# Patient Record
Sex: Female | Born: 1960 | Race: White | Hispanic: No | Smoking: Former smoker
Health system: Southern US, Community
[De-identification: ages and names within clinical notes are randomized; demographics above are authoritative.]

## PROBLEM LIST (undated history)

## (undated) DIAGNOSIS — R1310 Dysphagia, unspecified: Secondary | ICD-10-CM

## (undated) DIAGNOSIS — Q2116 Sinus venosus atrial septal defect, unspecified: Secondary | ICD-10-CM

## (undated) DIAGNOSIS — R32 Unspecified urinary incontinence: Secondary | ICD-10-CM

## (undated) DIAGNOSIS — G5600 Carpal tunnel syndrome, unspecified upper limb: Secondary | ICD-10-CM

## (undated) DIAGNOSIS — F32A Depression, unspecified: Secondary | ICD-10-CM

## (undated) DIAGNOSIS — G43909 Migraine, unspecified, not intractable, without status migrainosus: Secondary | ICD-10-CM

## (undated) DIAGNOSIS — F319 Bipolar disorder, unspecified: Secondary | ICD-10-CM

## (undated) DIAGNOSIS — R0600 Dyspnea, unspecified: Secondary | ICD-10-CM

## (undated) DIAGNOSIS — K219 Gastro-esophageal reflux disease without esophagitis: Secondary | ICD-10-CM

## (undated) DIAGNOSIS — Q211 Atrial septal defect: Secondary | ICD-10-CM

## (undated) DIAGNOSIS — E7800 Pure hypercholesterolemia, unspecified: Secondary | ICD-10-CM

## (undated) DIAGNOSIS — F329 Major depressive disorder, single episode, unspecified: Secondary | ICD-10-CM

## (undated) DIAGNOSIS — Z8774 Personal history of (corrected) congenital malformations of heart and circulatory system: Secondary | ICD-10-CM

## (undated) DIAGNOSIS — A6000 Herpesviral infection of urogenital system, unspecified: Secondary | ICD-10-CM

## (undated) DIAGNOSIS — R011 Cardiac murmur, unspecified: Secondary | ICD-10-CM

## (undated) DIAGNOSIS — I209 Angina pectoris, unspecified: Secondary | ICD-10-CM

## (undated) HISTORY — DX: Gastro-esophageal reflux disease without esophagitis: K21.9

## (undated) HISTORY — DX: Herpesviral infection of urogenital system, unspecified: A60.00

## (undated) HISTORY — PX: OTHER SURGICAL HISTORY: SHX169

## (undated) HISTORY — PX: ABDOMINAL HYSTERECTOMY: SHX81

## (undated) HISTORY — DX: Depression, unspecified: F32.A

## (undated) HISTORY — DX: Pure hypercholesterolemia, unspecified: E78.00

## (undated) HISTORY — DX: Atrial septal defect: Q21.1

## (undated) HISTORY — DX: Unspecified urinary incontinence: R32

## (undated) HISTORY — DX: Cardiac murmur, unspecified: R01.1

## (undated) HISTORY — DX: Migraine, unspecified, not intractable, without status migrainosus: G43.909

## (undated) HISTORY — DX: Bipolar disorder, unspecified: F31.9

## (undated) HISTORY — PX: HEMORRHOID SURGERY: SHX153

## (undated) HISTORY — DX: Major depressive disorder, single episode, unspecified: F32.9

## (undated) HISTORY — DX: Sinus venosus atrial septal defect, unspecified: Q21.16

## (undated) HISTORY — DX: Dysphagia, unspecified: R13.10

## (undated) HISTORY — DX: Carpal tunnel syndrome, unspecified upper limb: G56.00

## (undated) HISTORY — PX: TONSILLECTOMY: SUR1361

---

## 2005-02-19 ENCOUNTER — Ambulatory Visit: Payer: Self-pay | Admitting: Psychiatry

## 2005-03-05 ENCOUNTER — Ambulatory Visit: Payer: Self-pay | Admitting: Psychiatry

## 2011-06-05 ENCOUNTER — Telehealth (INDEPENDENT_AMBULATORY_CARE_PROVIDER_SITE_OTHER): Payer: Self-pay | Admitting: *Deleted

## 2011-06-05 NOTE — Telephone Encounter (Signed)
Mrs. Thad Ranger called about her daughter , Kristy Lewis. She says that Kristy Lewis is having problems , some of them are Hemorrhoids which were surgically removed , H/O fissures. Strong family history (maternal) of Colon Cancer.  Kristy Lewis is a patient of Dr.Shaw, I told the mother that we prefer the PCP to make a referral to [place]. I explained that Dr.Rehman may want to see Kristy Lewis in the office prior to arranging Colonoscopy. They just want Dr.Rehman to check her.  We can call the patient's daughter on her cell at [phone]

## 2011-06-09 NOTE — Telephone Encounter (Signed)
Will need to request by primary care physician for patient to be seen

## 2011-06-10 ENCOUNTER — Telehealth (INDEPENDENT_AMBULATORY_CARE_PROVIDER_SITE_OTHER): Payer: Self-pay | Admitting: *Deleted

## 2011-06-10 NOTE — Telephone Encounter (Signed)
Kristy Lewis we need records from PCP or Physician per Dr.Rehman then he will be happy to see her

## 2011-06-10 NOTE — Telephone Encounter (Signed)
Kristy Lewis is going to call daughter to arrange

## 2011-06-10 NOTE — Telephone Encounter (Signed)
Open in error

## 2011-06-11 NOTE — Telephone Encounter (Signed)
Called Kristy Lewis and advised her Dr. Karilyn Cota will see her but will need a referral send with office notes first.

## 2011-06-24 ENCOUNTER — Encounter (INDEPENDENT_AMBULATORY_CARE_PROVIDER_SITE_OTHER): Payer: Self-pay | Admitting: *Deleted

## 2011-08-14 ENCOUNTER — Encounter (INDEPENDENT_AMBULATORY_CARE_PROVIDER_SITE_OTHER): Payer: Self-pay | Admitting: *Deleted

## 2011-09-15 ENCOUNTER — Ambulatory Visit (INDEPENDENT_AMBULATORY_CARE_PROVIDER_SITE_OTHER): Payer: Self-pay | Admitting: Internal Medicine

## 2011-09-15 ENCOUNTER — Telehealth (INDEPENDENT_AMBULATORY_CARE_PROVIDER_SITE_OTHER): Payer: Self-pay | Admitting: *Deleted

## 2011-09-15 ENCOUNTER — Other Ambulatory Visit (INDEPENDENT_AMBULATORY_CARE_PROVIDER_SITE_OTHER): Payer: Self-pay | Admitting: *Deleted

## 2011-09-15 ENCOUNTER — Encounter (INDEPENDENT_AMBULATORY_CARE_PROVIDER_SITE_OTHER): Payer: Self-pay | Admitting: Internal Medicine

## 2011-09-15 VITALS — BP 100/62 | HR 56 | Temp 98.4°F | Ht 66.0 in | Wt 173.8 lb

## 2011-09-15 DIAGNOSIS — K625 Hemorrhage of anus and rectum: Secondary | ICD-10-CM | POA: Insufficient documentation

## 2011-09-15 DIAGNOSIS — Z1211 Encounter for screening for malignant neoplasm of colon: Secondary | ICD-10-CM

## 2011-09-15 MED ORDER — PEG-KCL-NACL-NASULF-NA ASC-C 100 G PO SOLR: 1.0000 | Freq: Once | ORAL | Status: DC

## 2011-09-15 NOTE — Progress Notes (Signed)
Subjective:     Patient ID: Kristy Lewis, female   DOB: 03-Mar-1960, 51 y.o.   MRN: [id number]  HPIAnnette is a 51 yr old female referred to our office for rectal bleeding, screening colonoscopy.  She tells me she sees blood with her stools about 3-4 times a week.  Bright red in color. No melena. Appetite is good. No weight loss. Acid reflux controlled with Dexilant. No abdominal pain.  She tells me when she has a BM she has to put pressure near her rectum to have a BM since the birth of her last child.   She has never undergone a screening colonoscopy. Hemorrhoidectomy in the 1990s by Dr. Cleotis Nipper for constipation.    Review of Systems Current Outpatient Prescriptions  Medication Sig Dispense Refill  . dexlansoprazole (DEXILANT) 60 MG capsule Take 60 mg by mouth daily.      . diphenhydramine-acetaminophen (TYLENOL PM) 25-500 MG TABS Take 1 tablet by mouth at bedtime as needed.      Marland Kitchen estradiol (ESTRACE) 0.5 MG tablet Take 0.5 mg by mouth daily.      Marland Kitchen FLUoxetine (PROZAC) 40 MG capsule Take 40 mg by mouth daily.      Marland Kitchen gabapentin (NEURONTIN) 100 MG capsule Take 100 mg by mouth 2 (two) times daily.      Marland Kitchen topiramate (TOPAMAX) 50 MG tablet Take 50 mg by mouth 2 (two) times daily.       Past Medical History  Diagnosis Date  . Migraines    Past Surgical History  Procedure Date  . Abdominal hysterectomy     with bladder surgery. Ovaries remained  . Hemorrhoid surgery   . Tonsillectomy    Family Status  Relation Status Death Age  . Mother Alive     anemic  . Father Deceased     suicide  . Brother Alive     good health   History   Social History  . Marital Status: Single    Spouse Name: N/A    Number of Children: N/A  . Years of Education: N/A   Occupational History  . Not on file.   Social History Main Topics  . Smoking status: Former Games developer  . Smokeless tobacco: Not on file   Comment: quit 2 yrs ago after smoking for over 20 yrs.  . Alcohol Use: Yes     socially   . Drug Use: Not on file  . Sexually Active: Not on file   Other Topics Concern  . Not on file   Social History Narrative  . No narrative on file   Allergies  Allergen Reactions  . Iodine   . Morphine And Related   . Shellfish Allergy        Objective:   Physical Exam Filed Vitals:   09/15/11 1019  Height: 5\' 6"  (1.676 m)  Weight: 173 lb 12.8 oz (78.835 kg)   Alert and oriented. Skin warm and dry. Oral mucosa is moist.   . Sclera anicteric, conjunctivae is pink. Thyroid not enlarged. No cervical lymphadenopathy. Lungs clear. Heart regular rate and rhythm.  Abdomen is soft. Bowel sounds are positive. No hepatomegaly. No abdominal masses felt. No tenderness.  No edema to lower extremities. Stool none and guaiac negative.      Assessment:    Rectal bleeding, probably hemorrhoidal. Screening colonoscopy    Plan:    Colonoscopy.   The risks and benefits such as perforation, bleeding, and infection were reviewed with the patient and is agreeable.

## 2011-09-15 NOTE — Telephone Encounter (Signed)
Patient needs movi prep 

## 2011-09-15 NOTE — Patient Instructions (Addendum)
Colonoscopy with Dr. Rehman. The risks and benefits such as perforation, bleeding, and infection were reviewed with the patient and is agreeable. 

## 2011-10-10 ENCOUNTER — Encounter (HOSPITAL_COMMUNITY): Payer: Self-pay | Admitting: Pharmacy Technician

## 2011-10-14 MED ORDER — SODIUM CHLORIDE 0.45 % IV SOLN
INTRAVENOUS | Status: DC
  Administered 2011-10-15: 1000 mL via INTRAVENOUS

## 2011-10-15 ENCOUNTER — Encounter (HOSPITAL_COMMUNITY): Payer: Self-pay | Admitting: *Deleted

## 2011-10-15 ENCOUNTER — Ambulatory Visit (HOSPITAL_COMMUNITY)
Admission: RE | Admit: 2011-10-15 | Discharge: 2011-10-15 | Disposition: A | Discharge status: Home / Self Care | Payer: PRIVATE HEALTH INSURANCE | Source: Ambulatory Visit | Attending: Internal Medicine | Admitting: Internal Medicine

## 2011-10-15 ENCOUNTER — Encounter (HOSPITAL_COMMUNITY)
Admission: RE | Disposition: A | Discharge status: Home / Self Care | Payer: Self-pay | Source: Ambulatory Visit | Attending: Internal Medicine

## 2011-10-15 DIAGNOSIS — K644 Residual hemorrhoidal skin tags: Secondary | ICD-10-CM

## 2011-10-15 DIAGNOSIS — D126 Benign neoplasm of colon, unspecified: Secondary | ICD-10-CM

## 2011-10-15 DIAGNOSIS — K625 Hemorrhage of anus and rectum: Secondary | ICD-10-CM

## 2011-10-15 DIAGNOSIS — K5730 Diverticulosis of large intestine without perforation or abscess without bleeding: Secondary | ICD-10-CM

## 2011-10-15 DIAGNOSIS — K921 Melena: Secondary | ICD-10-CM | POA: Insufficient documentation

## 2011-10-15 HISTORY — PX: COLONOSCOPY: SHX5424

## 2011-10-15 SURGERY — COLONOSCOPY
Anesthesia: Moderate Sedation

## 2011-10-15 MED ORDER — MEPERIDINE HCL 50 MG/ML IJ SOLN
INTRAMUSCULAR | Status: AC
  Filled 2011-10-15: qty 1

## 2011-10-15 MED ORDER — STERILE WATER FOR IRRIGATION IR SOLN
Status: DC | PRN
  Administered 2011-10-15: 13:00:00

## 2011-10-15 MED ORDER — MEPERIDINE HCL 50 MG/ML IJ SOLN
INTRAMUSCULAR | Status: DC | PRN
  Administered 2011-10-15 (×2): 25 mg via INTRAVENOUS

## 2011-10-15 MED ORDER — MIDAZOLAM HCL 5 MG/5ML IJ SOLN
INTRAMUSCULAR | Status: DC | PRN
  Administered 2011-10-15: 1 mg via INTRAVENOUS
  Administered 2011-10-15 (×3): 2 mg via INTRAVENOUS

## 2011-10-15 MED ORDER — MIDAZOLAM HCL 5 MG/5ML IJ SOLN
INTRAMUSCULAR | Status: AC
  Filled 2011-10-15: qty 10

## 2011-10-15 NOTE — H&P (Signed)
Kristy Lewis is an 51 y.o. female.   Chief Complaint: Patient is here for colonoscopy. HPI: Patient is 51 year old Caucasian female who is here for screening colonoscopy. She is intermittent hematochezia felt to be secondary to hemorrhoids. She also has intermittent constipation. She has good appetite denies weight loss. Him history is significant for colon carcinoma in her grandmother. Family is negative for CRC in a first-degree relative.  Past Medical History  Diagnosis Date  . Migraines   . Genital herpes     at age 48  . Cancer     Past Surgical History  Procedure Date  . Abdominal hysterectomy     with bladder surgery. Ovaries remained  . Hemorrhoid surgery   . Tonsillectomy   . Left shoulder manipulation     History reviewed. No pertinent family history. Social History:  reports that she has quit smoking. She does not have any smokeless tobacco history on file. She reports that she drinks alcohol. She reports that she does not use illicit drugs.  Allergies:  Allergies  Allergen Reactions  . Iodine   . Morphine And Related   . Shellfish Allergy     Medications Prior to Admission  Medication Sig Dispense Refill  . dexlansoprazole (DEXILANT) 60 MG capsule Take 60 mg by mouth daily as needed.       . diphenhydramine-acetaminophen (TYLENOL PM) 25-500 MG TABS Take 1 tablet by mouth at bedtime as needed.      Marland Kitchen estradiol (ESTRACE) 0.5 MG tablet Take 0.5 mg by mouth daily.      Marland Kitchen FLUoxetine (PROZAC) 40 MG capsule Take 40 mg by mouth daily.      Marland Kitchen gabapentin (NEURONTIN) 100 MG capsule Take 100 mg by mouth 2 (two) times daily.      . peg 3350 powder (MOVIPREP) 100 G SOLR Take 1 kit (100 g total) by mouth once.  1 kit  0  . topiramate (TOPAMAX) 50 MG tablet Take 50 mg by mouth 2 (two) times daily.        No results found for this or any previous visit (from the past 48 hour(s)). No results found.  ROS  Blood pressure 104/68, pulse 63, temperature 98 F (36.7 C),  temperature source Oral, resp. rate 19, height 5\' 6"  (1.676 m), weight 165 lb (74.844 kg), SpO2 95.00%. Physical Exam  Constitutional: She appears well-developed and well-nourished.  HENT:  Mouth/Throat: Oropharynx is clear and moist.  Eyes: Conjunctivae normal are normal. No scleral icterus.  Neck: No thyromegaly present.  Cardiovascular: Normal rate, regular rhythm and normal heart sounds.   No murmur heard. Respiratory: Effort normal and breath sounds normal.  GI: Soft. She exhibits no distension and no mass. There is no tenderness.  Musculoskeletal: She exhibits no edema.  Lymphadenopathy:    She has no cervical adenopathy.  Neurological: She is alert.  Skin: Skin is warm and dry.     Assessment/Plan Average risk screening colonoscopy.  Kristy Lewis U 10/15/2011, 1:21 PM

## 2011-10-15 NOTE — Op Note (Signed)
COLONOSCOPY PROCEDURE REPORT  PATIENT:  Kristy Lewis  MR#:  [id number] Birthdate:  1960-03-20, 51 y.o., female Endoscopist:  Dr. Malissa Hippo, MD Referred By:  Dr. Kirstie Peri, MD Procedure Date: 10/15/2011  Procedure:   Colonoscopy  Indications:  Patient is 51 year old Caucasian female was undergoing average risk screening colonoscopy. She has intermittent hematochezia felt to be secondary to hemorrhoids.  Informed Consent:  The procedure and risks were reviewed with the patient and informed consent was obtained.  Medications:  Demerol 50 mg IV Versed 7 mg IV  Description of procedure:  After a digital rectal exam was performed, that colonoscope was advanced from the anus through the rectum and colon to the area of the cecum, ileocecal valve and appendiceal orifice. The cecum was deeply intubated. These structures were well-seen and photographed for the record. From the level of the cecum and ileocecal valve, the scope was slowly and cautiously withdrawn. The mucosal surfaces were carefully surveyed utilizing scope tip to flexion to facilitate fold flattening as needed. The scope was pulled down into the rectum where a thorough exam including retroflexion was performed.  Findings:   Prep satisfactory. Small polyp ablated via cold biopsy from ileocecal valve. Two small diverticula at sigmoid colon. Normal rectal mucosa. Small hemorrhoids below the dentate line and focal thickening to anoderm.  Therapeutic/Diagnostic Maneuvers Performed:  See above  Complications:  None  Cecal Withdrawal Time:  15 minutes  Impression:  Examination performed to cecum. Small polyp ablated via cold biopsy from ileocecal valve. Two small diverticula at sigmoid colon. Small external hemorrhoids.  Recommendations:  Standard instructions given. I will contact patient with biopsy results and further recommendations.   REHMAN,NAJEEB U  10/15/2011 1:56 PM  CC: Dr. Kirstie Peri, MD & Dr. Bonnetta Barry  ref. provider found

## 2011-10-20 ENCOUNTER — Encounter (HOSPITAL_COMMUNITY): Payer: Self-pay | Admitting: Internal Medicine

## 2011-10-29 ENCOUNTER — Encounter (INDEPENDENT_AMBULATORY_CARE_PROVIDER_SITE_OTHER): Payer: Self-pay | Admitting: *Deleted

## 2013-11-17 ENCOUNTER — Other Ambulatory Visit (HOSPITAL_COMMUNITY): Payer: Self-pay | Admitting: Internal Medicine

## 2013-11-17 DIAGNOSIS — Z139 Encounter for screening, unspecified: Secondary | ICD-10-CM

## 2013-11-21 ENCOUNTER — Ambulatory Visit (HOSPITAL_COMMUNITY)
Admission: RE | Admit: 2013-11-21 | Discharge: 2013-11-21 | Disposition: A | Discharge status: Home / Self Care | Payer: BC Managed Care – PPO | Source: Ambulatory Visit | Attending: Internal Medicine | Admitting: Internal Medicine

## 2013-11-21 DIAGNOSIS — Z1231 Encounter for screening mammogram for malignant neoplasm of breast: Secondary | ICD-10-CM | POA: Insufficient documentation

## 2013-11-21 DIAGNOSIS — Z139 Encounter for screening, unspecified: Secondary | ICD-10-CM

## 2016-08-26 ENCOUNTER — Telehealth: Payer: Self-pay | Admitting: Cardiovascular Disease

## 2016-08-26 ENCOUNTER — Ambulatory Visit (INDEPENDENT_AMBULATORY_CARE_PROVIDER_SITE_OTHER): Payer: BLUE CROSS/BLUE SHIELD | Admitting: Cardiovascular Disease

## 2016-08-26 ENCOUNTER — Encounter: Payer: Self-pay | Admitting: *Deleted

## 2016-08-26 ENCOUNTER — Encounter: Payer: Self-pay | Admitting: Cardiovascular Disease

## 2016-08-26 VITALS — BP 110/70 | HR 58 | Ht 66.0 in | Wt 161.0 lb

## 2016-08-26 DIAGNOSIS — F32A Depression, unspecified: Secondary | ICD-10-CM

## 2016-08-26 DIAGNOSIS — R011 Cardiac murmur, unspecified: Secondary | ICD-10-CM

## 2016-08-26 DIAGNOSIS — F329 Major depressive disorder, single episode, unspecified: Secondary | ICD-10-CM

## 2016-08-26 DIAGNOSIS — R079 Chest pain, unspecified: Secondary | ICD-10-CM | POA: Diagnosis not present

## 2016-08-26 DIAGNOSIS — K219 Gastro-esophageal reflux disease without esophagitis: Secondary | ICD-10-CM | POA: Diagnosis not present

## 2016-08-26 DIAGNOSIS — R1310 Dysphagia, unspecified: Secondary | ICD-10-CM | POA: Diagnosis not present

## 2016-08-26 NOTE — Progress Notes (Signed)
CARDIOLOGY CONSULT NOTE  Patient ID: YILIA SACCA MRN: [id number] DOB/AGE: 03/31/1960 56 y.o.  Admit date: (Not on file) Primary Physician: Monico Blitz, MD Referring Physician: Manuella Ghazi  Reason for Consultation: chest pain  HPI: Kristy Lewis is a 56 y.o. female who is being seen today for the evaluation of chest pain at the request of Monico Blitz, MD.   She has a history of GERD.  Echocardiogram in November 2016 demonstrated normal left ventricular systolic function, EF 25-36%, normal diastolic function, mild right ventricular dilatation, mild right atrial dilatation, and mild tricuspid and pulmonic regurgitation, with a small pericardial effusion.  Recent ECG performed at PCPs office which I personally interpreted demonstrated normal sinus rhythm with no ischemic abnormalities nor arrhythmias noted.  She is tearful when describing her symptoms. She often says "my heart hurts and my heart is broken" and believes her symptoms are due to emotional stress. She has been having retrosternal chest pains occasionally radiating into the back for the past 3 weeks. This has occurred while she has been working. 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 She has managed this business on her own for the past 24 years.  She said both deep breaths help her symptoms as well as diazepam. She has been on Prozac for over 20 years. She said she has a lot of stress as she helps to take care of her daughter and her grandchildren. Her daughter used to be on drugs but has been drug free since May 25. Her daughter currently lives with her.  Social history: She has been single for the past 12 years. 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    Allergies  Allergen Reactions  . Iodine   . Morphine And Related   . Shellfish Allergy     Current Outpatient Prescriptions  Medication Sig Dispense Refill  . azelastine  (ASTELIN) 0.1 % nasal spray Place into both nostrils 2 (two) times daily. Use in each nostril as directed    . dexlansoprazole (DEXILANT) 60 MG capsule Take 60 mg by mouth daily as needed.     . diazepam (VALIUM) 2 MG tablet Take 2 mg by mouth every 6 (six) hours as needed for anxiety.    . diphenhydramine-acetaminophen (TYLENOL PM) 25-500 MG TABS Take 1 tablet by mouth at bedtime as needed.    Marland Kitchen FLUoxetine (PROZAC) 40 MG capsule Take 40 mg by mouth daily.    Marland Kitchen omeprazole (PRILOSEC) 20 MG capsule Take 20 mg by mouth 2 (two) times daily before a meal.      No current facility-administered medications for this visit.     Past Medical History:  Diagnosis Date  . Bipolar disorder ([place])   . Cancer ([place])   . Cardiac murmur   . Carpal tunnel syndrome   . Depressive disorder   . Dysphagia   . Esophageal reflux   . Genital herpes    at age 56  . Hypercholesteremia   . Migraines   . Urinary incontinence     Past Surgical History:  Procedure Laterality Date  . ABDOMINAL HYSTERECTOMY     with bladder surgery. Ovaries remained  . COLONOSCOPY  10/15/2011   Procedure: COLONOSCOPY;  Surgeon: Rogene Houston, MD;  Location: [facility];  Service: Endoscopy;  Laterality: N/A;  200  . HEMORRHOID SURGERY    . left shoulder  manipulation    . TONSILLECTOMY      Social History   Social History  . Marital status: Divorced    Spouse name: N/A  . Number of children: N/A  . Years of education: N/A   Occupational History  . Not on file.   Social History Main Topics  . Smoking status: Former Research scientist (life sciences)  . Smokeless tobacco: Never Used     Comment: quit 2 yrs ago after smoking for over 20 yrs.  . Alcohol use Yes     Comment: socially  . Drug use: No  . Sexual activity: Not on file   Other Topics Concern  . Not on file   Social History Narrative  . No narrative on file     No family history of premature CAD in 1st degree relatives.  Current Meds  Medication Sig  . azelastine  (ASTELIN) 0.1 % nasal spray Place into both nostrils 2 (two) times daily. Use in each nostril as directed  . dexlansoprazole (DEXILANT) 60 MG capsule Take 60 mg by mouth daily as needed.   . diazepam (VALIUM) 2 MG tablet Take 2 mg by mouth every 6 (six) hours as needed for anxiety.  . diphenhydramine-acetaminophen (TYLENOL PM) 25-500 MG TABS Take 1 tablet by mouth at bedtime as needed.  Marland Kitchen FLUoxetine (PROZAC) 40 MG capsule Take 40 mg by mouth daily.  Marland Kitchen omeprazole (PRILOSEC) 20 MG capsule Take 20 mg by mouth 2 (two) times daily before a meal.       Review of systems complete and found to be negative unless listed above in HPI    Physical exam Blood pressure 110/70, pulse (!) 58, height 5\' 6"  (1.676 m), weight 161 lb (73 kg), SpO2 97 %. General: NAD Neck: No JVD, no thyromegaly or thyroid nodule.  Lungs: Clear to auscultation bilaterally with normal respiratory effort. CV: Nondisplaced PMI. Regular rate and rhythm, normal S1/S2, no F2/T2, soft systolic murmur over LUSB.  No peripheral edema.  No carotid bruit.    Abdomen: Soft, nontender, no distention.  Skin: Intact without lesions or rashes.  Neurologic: Alert and oriented x 3.  Psych: Tearful. Extremities: No clubbing or cyanosis.  HEENT: Normal.   ECG: Most recent ECG reviewed.   Labs: No results found for: K, BUN, CREATININE, ALT, TSH, HGB   Lipids: No results found for: LDLCALC, LDLDIRECT, CHOL, TRIG, HDL      ASSESSMENT AND PLAN:  1. Chest pain and murmur: This very well may be due to emotional stress. She also has dysphagia for solids and pills. There may be a component of esophageal stricture or narrowing. She does have a cardiac murmur. I will obtain an echocardiogram and stress echocardiogram for further evaluation.  2. GERD: Given her history of dysphagia, she may need to see a gastroenterologist for EGD.  3. Depression: Prozac may no longer be working. She was tearful throughout my evaluation. I will make a  behavioral therapy referral.  Disposition: Follow up in 1 month  Signed: Kate Sable, M.D., F.A.C.C.  08/26/2016, 2:10 PM

## 2016-08-26 NOTE — Patient Instructions (Signed)
Medication Instructions:  Continue all current medications.  Labwork: none  Testing/Procedures:  Your physician has requested that you have an echocardiogram. Echocardiography is a painless test that uses sound waves to create images of your heart. It provides your doctor with information about the size and shape of your heart and how well your heart's chambers and valves are working. This procedure takes approximately one hour. There are no restrictions for this procedure.  Your physician has requested that you have a stress echocardiogram. For further information please visit [email or web address]. Please follow instruction sheet as given.  Office will contact with results via phone or letter.    Follow-Up: 1 month   Any Other Special Instructions Will Be Listed Below (If Applicable). You have been referred to:  Behavioral Health at [facility]  If you need a refill on your cardiac medications before your next appointment, please call your pharmacy.

## 2016-08-26 NOTE — Telephone Encounter (Signed)
Pre-cert Verification for the following procedure   Echo & Stress Echo scheduled for 08-29-16 at [facility]

## 2016-08-29 ENCOUNTER — Ambulatory Visit (HOSPITAL_BASED_OUTPATIENT_CLINIC_OR_DEPARTMENT_OTHER)
Admission: RE | Admit: 2016-08-29 | Discharge: 2016-08-29 | Disposition: A | Discharge status: Home / Self Care | Payer: BLUE CROSS/BLUE SHIELD | Source: Ambulatory Visit | Attending: Cardiovascular Disease | Admitting: Cardiovascular Disease

## 2016-08-29 ENCOUNTER — Ambulatory Visit (HOSPITAL_COMMUNITY)
Admission: RE | Admit: 2016-08-29 | Discharge: 2016-08-29 | Disposition: A | Discharge status: Home / Self Care | Payer: BLUE CROSS/BLUE SHIELD | Source: Ambulatory Visit | Attending: Cardiovascular Disease | Admitting: Cardiovascular Disease

## 2016-08-29 DIAGNOSIS — R011 Cardiac murmur, unspecified: Secondary | ICD-10-CM | POA: Insufficient documentation

## 2016-08-29 DIAGNOSIS — R079 Chest pain, unspecified: Secondary | ICD-10-CM

## 2016-08-29 DIAGNOSIS — K219 Gastro-esophageal reflux disease without esophagitis: Secondary | ICD-10-CM | POA: Insufficient documentation

## 2016-08-29 DIAGNOSIS — Z87891 Personal history of nicotine dependence: Secondary | ICD-10-CM | POA: Insufficient documentation

## 2016-08-29 DIAGNOSIS — I358 Other nonrheumatic aortic valve disorders: Secondary | ICD-10-CM | POA: Insufficient documentation

## 2016-08-29 DIAGNOSIS — I517 Cardiomegaly: Secondary | ICD-10-CM | POA: Insufficient documentation

## 2016-08-29 DIAGNOSIS — E785 Hyperlipidemia, unspecified: Secondary | ICD-10-CM | POA: Insufficient documentation

## 2016-08-29 LAB — ECHOCARDIOGRAM STRESS TEST
CHL CUP RESTING HR STRESS: 63 {beats}/min
CHL RATE OF PERCEIVED EXERTION: 16
CSEPED: 7 min
CSEPEW: 10.1 METS
CSEPPHR: 123 {beats}/min
Exercise duration (sec): 42 s
MPHR: 165 {beats}/min
Percent HR: 74 %

## 2016-08-29 NOTE — Progress Notes (Signed)
*  PRELIMINARY RESULTS* Echocardiogram Echocardiogram Stress Test has been performed.  Kristy Lewis 08/29/2016, 1:31 PM

## 2016-08-29 NOTE — Progress Notes (Signed)
*  PRELIMINARY RESULTS* Echocardiogram 2D Echocardiogram has been performed.  Leavy Cella 08/29/2016, 11:45 AM

## 2016-09-02 ENCOUNTER — Other Ambulatory Visit (HOSPITAL_COMMUNITY): Payer: BLUE CROSS/BLUE SHIELD

## 2016-09-02 ENCOUNTER — Telehealth: Payer: Self-pay | Admitting: *Deleted

## 2016-09-02 DIAGNOSIS — R079 Chest pain, unspecified: Secondary | ICD-10-CM

## 2016-09-02 NOTE — Telephone Encounter (Signed)
ECHO -  Notes recorded by Herminio Commons, MD on 09/01/2016 at 9:33 AM EDT Right-sided heart chambers were enlarged likely due to tobacco use and consequent lung disease. Main pumping chamber showed normal function.  STRESS ECHO -  Notes recorded by Herminio Commons, MD on 09/01/2016 at 9:32 AM EDT Did not achieve target HR, thus ability to detect blockages is decreased. Please obtain Lexiscan Myoview.

## 2016-09-02 NOTE — Telephone Encounter (Signed)
Notes recorded by Laurine Blazer, LPN on 4/65/0354 at 6:56 PM EDT Patient notified. Copy to pmd. She agrees to do stress test. Will put order in & forward to [facility] for scheduling. Instructions given to have nothing to eat 4-6 hours prior to test & no caffeine x 24 hours. She may take all meds morning of test. ------

## 2016-09-03 ENCOUNTER — Telehealth: Payer: Self-pay | Admitting: Cardiovascular Disease

## 2016-09-03 NOTE — Telephone Encounter (Signed)
Lexiscan scheduled at [facility] Aug 23 arrive at 8:45

## 2016-09-11 ENCOUNTER — Encounter (HOSPITAL_COMMUNITY): Payer: Self-pay

## 2016-09-11 ENCOUNTER — Encounter (HOSPITAL_BASED_OUTPATIENT_CLINIC_OR_DEPARTMENT_OTHER)
Admission: RE | Admit: 2016-09-11 | Discharge: 2016-09-11 | Disposition: A | Discharge status: Home / Self Care | Payer: BLUE CROSS/BLUE SHIELD | Source: Ambulatory Visit | Attending: Cardiovascular Disease | Admitting: Cardiovascular Disease

## 2016-09-11 ENCOUNTER — Encounter (HOSPITAL_COMMUNITY)
Admission: RE | Admit: 2016-09-11 | Discharge: 2016-09-11 | Disposition: A | Discharge status: Home / Self Care | Payer: BLUE CROSS/BLUE SHIELD | Source: Ambulatory Visit | Attending: Cardiovascular Disease | Admitting: Cardiovascular Disease

## 2016-09-11 DIAGNOSIS — R079 Chest pain, unspecified: Secondary | ICD-10-CM | POA: Insufficient documentation

## 2016-09-11 LAB — NM MYOCAR MULTI W/SPECT W/WALL MOTION / EF
CHL CUP NUCLEAR SRS: 3
CHL CUP NUCLEAR SSS: 3
CHL CUP RESTING HR STRESS: 55 {beats}/min
LHR: 0.42
LV dias vol: 60 mL (ref 46–106)
LV sys vol: 19 mL
NUC STRESS TID: 1.27
Peak HR: 83 {beats}/min
SDS: 0

## 2016-09-11 MED ORDER — TECHNETIUM TC 99M TETROFOSMIN IV KIT
30.0000 | PACK | Freq: Once | INTRAVENOUS | Status: AC | PRN
  Administered 2016-09-11: 32.5 via INTRAVENOUS

## 2016-09-11 MED ORDER — TECHNETIUM TC 99M TETROFOSMIN IV KIT
10.0000 | PACK | Freq: Once | INTRAVENOUS | Status: AC | PRN
  Administered 2016-09-11: 11 via INTRAVENOUS

## 2016-09-11 MED ORDER — REGADENOSON 0.4 MG/5ML IV SOLN
INTRAVENOUS | Status: AC
  Administered 2016-09-11: 0.4 mg via INTRAVENOUS
  Filled 2016-09-11: qty 5

## 2016-09-11 MED ORDER — SODIUM CHLORIDE 0.9% FLUSH
INTRAVENOUS | Status: AC
  Administered 2016-09-11: 10 mL via INTRAVENOUS
  Filled 2016-09-11: qty 10

## 2016-09-18 ENCOUNTER — Telehealth: Payer: Self-pay | Admitting: *Deleted

## 2016-09-18 NOTE — Telephone Encounter (Signed)
Notes recorded by Laurine Blazer, LPN on 2/90/9030 at 14:99 AM EDT Patient notified. Copy to pmd. Follow up already scheduled for 09/26/2016. ------  Notes recorded by Herminio Commons, MD on 09/11/2016 at 4:34 PM EDT Normal.

## 2016-09-26 ENCOUNTER — Encounter: Payer: Self-pay | Admitting: Cardiovascular Disease

## 2016-09-26 ENCOUNTER — Ambulatory Visit (INDEPENDENT_AMBULATORY_CARE_PROVIDER_SITE_OTHER): Payer: BLUE CROSS/BLUE SHIELD | Admitting: Cardiovascular Disease

## 2016-09-26 VITALS — BP 100/65 | HR 66 | Ht 66.0 in | Wt 165.2 lb

## 2016-09-26 DIAGNOSIS — Z87891 Personal history of nicotine dependence: Secondary | ICD-10-CM

## 2016-09-26 DIAGNOSIS — I272 Pulmonary hypertension, unspecified: Secondary | ICD-10-CM | POA: Diagnosis not present

## 2016-09-26 DIAGNOSIS — Z832 Family history of diseases of the blood and blood-forming organs and certain disorders involving the immune mechanism: Secondary | ICD-10-CM | POA: Diagnosis not present

## 2016-09-26 DIAGNOSIS — I517 Cardiomegaly: Secondary | ICD-10-CM | POA: Diagnosis not present

## 2016-09-26 DIAGNOSIS — R0602 Shortness of breath: Secondary | ICD-10-CM

## 2016-09-26 DIAGNOSIS — R079 Chest pain, unspecified: Secondary | ICD-10-CM

## 2016-09-26 MED ORDER — NITROGLYCERIN 0.4 MG SL SUBL: 0.4000 mg | SUBLINGUAL_TABLET | SUBLINGUAL | 3 refills | Status: DC | PRN

## 2016-09-26 NOTE — Addendum Note (Signed)
Addended by: Laurine Blazer on: 09/26/2016 05:06 PM   Modules accepted: Orders

## 2016-09-26 NOTE — Progress Notes (Signed)
SUBJECTIVE: The patient returns for follow-up after undergoing cardiovascular testing performed for the evaluation of chest pain.  Echocardiogram 08/29/16 showed normal left ventricular systolic function and diastolic function with normal regional wall motion, LVEF 60-65%, moderate left atrial dilatation, mild right atrial dilatation, moderate right ventricular dilatation, with suggestions of right ventricular pressure overload. Right ventricular systolic function was normal.  Initially a stress echocardiogram was attempted but she did not achieve the target heart rate. I then obtained a Lexiscan Myoview stress test. This was found to be normal.  She smoked during her teen years and then stopped while she was pregnant. She then smoked about a pack of cigarettes daily for 20 years and quit 2 years ago.  She said she is short of breath primarily around bleach and ammonia and other chemicals. She denies leg swelling, orthopnea, chronic cough, and paroxysmal nocturnal dyspnea.  She continues to have retrosternal chest pain radiating into the back but says it is less severe than it used to be. She said stress levels have also decreased.  Of note, her brother has a blood clotting disorder. She says he travels a lot.  She takes Prozac and has done so for 20 years and takes Valium prn.   Social history: She has been single for the past 12 years. 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  Review of Systems: As per "subjective", otherwise negative.  Allergies  Allergen Reactions  . Iodine   . Morphine And Related   . Shellfish Allergy     Current Outpatient Prescriptions  Medication Sig Dispense Refill  . azelastine (ASTELIN) 0.1 % nasal spray Place into both nostrils 2 (two) times daily. Use in each nostril as directed    . dexlansoprazole (DEXILANT) 60 MG capsule Take 60 mg by mouth daily as needed.     . diazepam  (VALIUM) 2 MG tablet Take 2 mg by mouth every 6 (six) hours as needed for anxiety.    . diphenhydramine-acetaminophen (TYLENOL PM) 25-500 MG TABS Take 1 tablet by mouth at bedtime as needed.    Marland Kitchen FLUoxetine (PROZAC) 40 MG capsule Take 40 mg by mouth daily.    Marland Kitchen omeprazole (PRILOSEC) 20 MG capsule Take 20 mg by mouth 2 (two) times daily before a meal.      No current facility-administered medications for this visit.     Past Medical History:  Diagnosis Date  . Bipolar disorder ([place])   . Cancer ([place])   . Cardiac murmur   . Carpal tunnel syndrome   . Depressive disorder   . Dysphagia   . Esophageal reflux   . Genital herpes    at age 70  . Hypercholesteremia   . Migraines   . Urinary incontinence     Past Surgical History:  Procedure Laterality Date  . ABDOMINAL HYSTERECTOMY     with bladder surgery. Ovaries remained  . COLONOSCOPY  10/15/2011   Procedure: COLONOSCOPY;  Surgeon: Rogene Houston, MD;  Location: [facility];  Service: Endoscopy;  Laterality: N/A;  200  . HEMORRHOID SURGERY    . left shoulder manipulation    . TONSILLECTOMY      Social History   Social History  . Marital status: Divorced    Spouse name: N/A  . Number of children: N/A  . Years of education: N/A   Occupational History  . Not on file.  Social History Main Topics  . Smoking status: Former Research scientist (life sciences)  . Smokeless tobacco: Never Used     Comment: quit 2 yrs ago after smoking for over 20 yrs.  . Alcohol use Yes     Comment: socially  . Drug use: No  . Sexual activity: Not on file   Other Topics Concern  . Not on file   Social History Narrative  . No narrative on file     Vitals:   09/26/16 1605  BP: 100/65  Pulse: 66  SpO2: 97%  Weight: 165 lb 3.2 oz (74.9 kg)  Height: 5\' 6"  (1.676 m)    Wt Readings from Last 3 Encounters:  09/26/16 165 lb 3.2 oz (74.9 kg)  08/26/16 161 lb (73 kg)  10/15/11 165 lb (74.8 kg)     PHYSICAL EXAM General: NAD HEENT: Normal. Neck: No JVD,  no thyromegaly. Lungs: Clear to auscultation bilaterally with normal respiratory effort. CV: Nondisplaced PMI.  Regular rate and rhythm, normal S1/S2, no N1/Z0, soft systolic murmur over RUSB. No pretibial or periankle edema.  No carotid bruit.   Abdomen: Soft, nontender, no distention.  Neurologic: Alert and oriented.  Psych: Normal affect. Skin: Normal. Musculoskeletal: No gross deformities.    ECG: Most recent ECG reviewed.   Labs: No results found for: K, BUN, CREATININE, ALT, TSH, HGB   Lipids: No results found for: LDLCALC, LDLDIRECT, CHOL, TRIG, HDL     ASSESSMENT AND PLAN: 1. Chest pain: Nuclear stress test was normal. However, she continues to have chest discomfort radiating into the back. I will focus the workup on pulmonary hypertension. I will also prescribe sublingual nitroglycerin to be used as needed.  2. Right-sided chamber enlargement with probable pulmonary hypertension: Given that left ventricular diastolic parameters were normal, the concern is for pulmonary hypertension. Given her many years of tobacco abuse, I will obtain pulmonary function testing to evaluate for COPD. I will also obtain a VQ scan to evaluate for chronic thromboembolic pulmonary hypertension, as her brother has a history of clotting disorders.  3. Anxiety and depression: Has taken fluoxetine for 20 years and takes diazepam as needed.   Disposition: Follow up 4-6 weeks.   Kristy Lewis, M.D., F.A.C.C.

## 2016-09-26 NOTE — Patient Instructions (Addendum)
Medication Instructions:   Begin Nitroglycerin as needed for severe chest pain only.   Continue all other current medications.  Labwork: none  Testing/Procedures:  Your physician has recommended that you have a pulmonary function test. Pulmonary Function Tests are a group of tests that measure how well air moves in and out of your lungs.  Ventilation / perfusion (VQ) scan   Office will contact with results via phone or letter.    Follow-Up: 4-6 weeks   Any Other Special Instructions Will Be Listed Below (If Applicable).  If you need a refill on your cardiac medications before your next appointment, please call your pharmacy.

## 2016-09-29 ENCOUNTER — Other Ambulatory Visit: Payer: Self-pay | Admitting: *Deleted

## 2016-09-29 ENCOUNTER — Telehealth: Payer: Self-pay | Admitting: Cardiovascular Disease

## 2016-09-29 DIAGNOSIS — Z01812 Encounter for preprocedural laboratory examination: Secondary | ICD-10-CM

## 2016-09-29 DIAGNOSIS — R0602 Shortness of breath: Secondary | ICD-10-CM

## 2016-09-29 NOTE — Telephone Encounter (Signed)
Pre-cert Verification for the following procedure   VQ SCAN scheduled for 10-01-16 & PFT'S scheduled for 10-08-16

## 2016-09-30 ENCOUNTER — Encounter (HOSPITAL_COMMUNITY): Payer: BLUE CROSS/BLUE SHIELD

## 2016-10-01 ENCOUNTER — Encounter (HOSPITAL_COMMUNITY): Payer: Self-pay

## 2016-10-01 ENCOUNTER — Encounter (HOSPITAL_COMMUNITY)
Admission: RE | Admit: 2016-10-01 | Discharge: 2016-10-01 | Disposition: A | Discharge status: Home / Self Care | Payer: BLUE CROSS/BLUE SHIELD | Source: Ambulatory Visit | Attending: Cardiovascular Disease | Admitting: Cardiovascular Disease

## 2016-10-01 ENCOUNTER — Ambulatory Visit (HOSPITAL_COMMUNITY)
Admission: RE | Admit: 2016-10-01 | Discharge: 2016-10-01 | Disposition: A | Discharge status: Home / Self Care | Payer: BLUE CROSS/BLUE SHIELD | Source: Ambulatory Visit | Attending: Cardiovascular Disease | Admitting: Cardiovascular Disease

## 2016-10-01 DIAGNOSIS — R0602 Shortness of breath: Secondary | ICD-10-CM

## 2016-10-01 DIAGNOSIS — R079 Chest pain, unspecified: Secondary | ICD-10-CM

## 2016-10-01 DIAGNOSIS — Z01812 Encounter for preprocedural laboratory examination: Secondary | ICD-10-CM | POA: Diagnosis not present

## 2016-10-01 DIAGNOSIS — J811 Chronic pulmonary edema: Secondary | ICD-10-CM | POA: Insufficient documentation

## 2016-10-01 DIAGNOSIS — I517 Cardiomegaly: Secondary | ICD-10-CM | POA: Diagnosis not present

## 2016-10-01 DIAGNOSIS — Z832 Family history of diseases of the blood and blood-forming organs and certain disorders involving the immune mechanism: Secondary | ICD-10-CM | POA: Diagnosis not present

## 2016-10-01 DIAGNOSIS — I272 Pulmonary hypertension, unspecified: Secondary | ICD-10-CM

## 2016-10-01 MED ORDER — TECHNETIUM TC 99M DIETHYLENETRIAME-PENTAACETIC ACID
30.0000 | Freq: Once | INTRAVENOUS | Status: AC | PRN
  Administered 2016-10-01: 32 via RESPIRATORY_TRACT

## 2016-10-01 MED ORDER — TECHNETIUM TO 99M ALBUMIN AGGREGATED
4.0000 | Freq: Once | INTRAVENOUS | Status: AC | PRN
  Administered 2016-10-01: 4 via INTRAVENOUS

## 2016-10-02 ENCOUNTER — Telehealth: Payer: Self-pay | Admitting: *Deleted

## 2016-10-02 MED ORDER — FUROSEMIDE 20 MG PO TABS: ORAL_TABLET | ORAL | 0 refills | Status: DC

## 2016-10-02 NOTE — Telephone Encounter (Signed)
Notes recorded by Laurine Blazer, LPN on 10/14/4626 at 6:38 PM EDT Patient notified and verbalized understanding. New prescription sent to [facility] now. States that she does not really feel SOB, but c/o hurting mostly. PFT scheduled for 10/08/2016. Follow up scheduled for 11/04/2016.  Copy to pmd.

## 2016-10-02 NOTE — Telephone Encounter (Signed)
VQ SCAN -   Notes recorded by Herminio Commons, MD on 10/01/2016 at 9:42 AM EDT No lung clots.   CHEST X-RAY -  Notes recorded by Herminio Commons, MD on 10/01/2016 at 9:42 AM EDT Mild amount of fluid in the lungs. Give Lasix 20 mg daily x 2 days. Prescribe 20 tablets in case she needs them in the future. Ask her if she's short of breath.

## 2016-10-02 NOTE — Telephone Encounter (Signed)
Message fwd to provider for any further suggestions in the meantime.

## 2016-10-03 NOTE — Telephone Encounter (Signed)
I'll await PFT results.

## 2016-10-03 NOTE — Telephone Encounter (Signed)
Patient notified

## 2016-10-08 ENCOUNTER — Ambulatory Visit (HOSPITAL_COMMUNITY)
Admission: RE | Admit: 2016-10-08 | Discharge: 2016-10-08 | Disposition: A | Discharge status: Home / Self Care | Payer: BLUE CROSS/BLUE SHIELD | Source: Ambulatory Visit | Attending: Cardiovascular Disease | Admitting: Cardiovascular Disease

## 2016-10-08 DIAGNOSIS — Z87891 Personal history of nicotine dependence: Secondary | ICD-10-CM | POA: Insufficient documentation

## 2016-10-08 DIAGNOSIS — I517 Cardiomegaly: Secondary | ICD-10-CM | POA: Insufficient documentation

## 2016-10-08 DIAGNOSIS — I272 Pulmonary hypertension, unspecified: Secondary | ICD-10-CM | POA: Insufficient documentation

## 2016-10-08 MED ORDER — ALBUTEROL SULFATE (2.5 MG/3ML) 0.083% IN NEBU
2.5000 mg | INHALATION_SOLUTION | Freq: Once | RESPIRATORY_TRACT | Status: AC
  Administered 2016-10-08: 2.5 mg via RESPIRATORY_TRACT

## 2016-10-09 LAB — PULMONARY FUNCTION TEST
DL/VA % PRED: 87 %
DL/VA: 4.43 ml/min/mmHg/L
DLCO cor % pred: 79 %
DLCO cor: 21.39 ml/min/mmHg
DLCO unc % pred: 79 %
DLCO unc: 21.39 ml/min/mmHg
FEF 25-75 PRE: 2.88 L/s
FEF 25-75 Post: 3.22 L/sec
FEF2575-%CHANGE-POST: 11 %
FEF2575-%PRED-PRE: 109 %
FEF2575-%Pred-Post: 121 %
FEV1-%Change-Post: 1 %
FEV1-%PRED-PRE: 86 %
FEV1-%Pred-Post: 88 %
FEV1-PRE: 2.48 L
FEV1-Post: 2.53 L
FEV1FVC-%CHANGE-POST: 1 %
FEV1FVC-%Pred-Pre: 105 %
FEV6-%CHANGE-POST: 0 %
FEV6-%PRED-POST: 84 %
FEV6-%Pred-Pre: 84 %
FEV6-PRE: 2.98 L
FEV6-Post: 3.01 L
FEV6FVC-%PRED-PRE: 103 %
FEV6FVC-%Pred-Post: 103 %
FVC-%CHANGE-POST: 0 %
FVC-%PRED-POST: 82 %
FVC-%Pred-Pre: 81 %
FVC-Post: 3.01 L
FVC-Pre: 2.98 L
POST FEV1/FVC RATIO: 84 %
POST FEV6/FVC RATIO: 100 %
Pre FEV1/FVC ratio: 83 %
Pre FEV6/FVC Ratio: 100 %
RV % PRED: 126 %
RV: 2.53 L
TLC % PRED: 100 %
TLC: 5.37 L

## 2016-10-17 ENCOUNTER — Telehealth: Payer: Self-pay | Admitting: *Deleted

## 2016-10-17 NOTE — Telephone Encounter (Signed)
Notes recorded by Laurine Blazer, LPN on 02/21/5425 at 0:62 PM EDT Patient notified. Copy to pmd. Stated that she declines to do the labs at this time, thinks that it is not necessary. Explained reasoning for those specific labs related to pulmonary hypertension.** (It's part of the normal routine labs for pulmonary hypertension, as it can lead to high pressures in the lungs and abnormalities with the pulmonary arteries in particular. That is the sole reason.)  Starting to feel a lot better since some of the stress is off of her. She will keep already scheduled follow up for 11-04-2016 with Dr. Bronson Ing for now. ------  Notes recorded by Herminio Commons, MD on 10/10/2016 at 10:00 AM EDT Please obtain the following blood tests: HIV, LFT's, and ANA. ------  Notes recorded by Laurine Blazer, LPN on 3/76/2831 at 5:17 PM EDT Next OV is scheduled for 11-04-16 here in [place]. ------  Notes recorded by Herminio Commons, MD on 10/09/2016 at 4:32 PM EDT Only mild abnormalities seen. When is her follow up visit? I'll have to discuss next steps.

## 2016-11-04 ENCOUNTER — Ambulatory Visit: Payer: BLUE CROSS/BLUE SHIELD | Admitting: Cardiovascular Disease

## 2016-11-13 ENCOUNTER — Telehealth: Payer: Self-pay | Admitting: *Deleted

## 2016-11-13 NOTE — Telephone Encounter (Signed)
Phone call placed to patient for follow up.  She is not sure about coming back.  Received $700.00 bill & can not keep having test done.  I explained to the patient your concerns for PH.  Suggested that she may even discuss with her pmd if she feels necessary.  She will call back if changes her mind.  Stated that the doctor could call her if he needed to.

## 2016-11-13 NOTE — Telephone Encounter (Signed)
-----   Message from Herminio Commons, MD sent at 11/07/2016 12:35 PM EDT ----- Regarding: Follow up I'm concerned about possible pulmonary hypertension. I see she refused labs. She was supposed to follow up on 11-04-16. Any idea if/when she is rescheduling?

## 2016-11-14 NOTE — Telephone Encounter (Signed)
I will try and reach out to her about the importance of her findings.

## 2016-11-14 NOTE — Telephone Encounter (Signed)
Noted  

## 2016-12-30 ENCOUNTER — Other Ambulatory Visit (HOSPITAL_COMMUNITY): Payer: Self-pay | Admitting: Internal Medicine

## 2016-12-30 DIAGNOSIS — Z1231 Encounter for screening mammogram for malignant neoplasm of breast: Secondary | ICD-10-CM

## 2017-01-02 ENCOUNTER — Encounter (HOSPITAL_COMMUNITY): Payer: Self-pay

## 2017-01-02 ENCOUNTER — Ambulatory Visit (HOSPITAL_COMMUNITY)
Admission: RE | Admit: 2017-01-02 | Discharge: 2017-01-02 | Disposition: A | Discharge status: Home / Self Care | Payer: BLUE CROSS/BLUE SHIELD | Source: Ambulatory Visit | Attending: Internal Medicine | Admitting: Internal Medicine

## 2017-01-02 DIAGNOSIS — Z1231 Encounter for screening mammogram for malignant neoplasm of breast: Secondary | ICD-10-CM | POA: Insufficient documentation

## 2017-04-29 ENCOUNTER — Encounter (INDEPENDENT_AMBULATORY_CARE_PROVIDER_SITE_OTHER): Payer: Self-pay | Admitting: *Deleted

## 2017-05-18 ENCOUNTER — Telehealth (INDEPENDENT_AMBULATORY_CARE_PROVIDER_SITE_OTHER): Payer: Self-pay | Admitting: *Deleted

## 2017-05-18 NOTE — Telephone Encounter (Signed)
rec'd referral from PCP (shah) for screening TCS -- last done 2013, polyp was a lipoma, fam hx colon ca, grandmother, your recommendation was to repeat in 10 years, does she need or 2023 -- please advise

## 2017-05-20 NOTE — Telephone Encounter (Signed)
Spoke to patient advised of Dr Olevia Perches recommendation, TCS not due until 10/2021 unless she develops issues, she states she "has to push her bottom" to have BM, OV sch'd

## 2017-05-20 NOTE — Telephone Encounter (Signed)
I do not see any reason or indication to proceed with exam now.

## 2017-05-28 ENCOUNTER — Encounter (INDEPENDENT_AMBULATORY_CARE_PROVIDER_SITE_OTHER): Payer: Self-pay | Admitting: Internal Medicine

## 2017-05-28 ENCOUNTER — Ambulatory Visit (INDEPENDENT_AMBULATORY_CARE_PROVIDER_SITE_OTHER): Payer: BLUE CROSS/BLUE SHIELD | Admitting: Internal Medicine

## 2017-05-28 VITALS — BP 118/72 | HR 76 | Temp 98.9°F | Ht 67.0 in | Wt 162.9 lb

## 2017-05-28 DIAGNOSIS — K625 Hemorrhage of anus and rectum: Secondary | ICD-10-CM | POA: Diagnosis not present

## 2017-05-28 NOTE — Progress Notes (Signed)
   Subjective:    Patient ID: Kristy Lewis, female    DOB: 05/17/1960, 57 y.o.   MRN: [id number] PCP Dr. Manuella Ghazi.  HPIHere today requesting a screening colonoscopy. Her last colonoscopy was in 2013 which was normal. One polyp was a lipoma. Next colonoscopy in 2023.  She saw her GYN, she says she has to push her genital area to have a BM. She was advised that is was nothing to worry about. She says she sometimes has rectal bleeding. She see blood when she strain occasionally. Stools are hard sometimes. She has to strain to have a BM. Has had to strain for about 2 yrs.   Her appetite is okay. No weight loss. Has a BM x 2 a day.     04/21/2017 H and H 13.6 and 39.7.   Family hx of colon cancer in grandmother age 7. Review of Systems Past Medical History:  Diagnosis Date  . Bipolar disorder ([place])   . Cancer ([place])   . Cardiac murmur   . Carpal tunnel syndrome   . Depressive disorder   . Dysphagia   . Esophageal reflux   . Genital herpes    at age 37  . Hypercholesteremia   . Migraines   . Urinary incontinence     Past Surgical History:  Procedure Laterality Date  . ABDOMINAL HYSTERECTOMY     with bladder surgery. Ovaries remained  . COLONOSCOPY  10/15/2011   Procedure: COLONOSCOPY;  Surgeon: Rogene Houston, MD;  Location: [facility];  Service: Endoscopy;  Laterality: N/A;  200  . HEMORRHOID SURGERY    . left shoulder manipulation    . TONSILLECTOMY      Allergies  Allergen Reactions  . Iodine   . Morphine And Related   . Shellfish Allergy     Current Outpatient Medications on File Prior to Visit  Medication Sig Dispense Refill  . diazepam (VALIUM) 2 MG tablet Take 2 mg by mouth every 6 (six) hours as needed for anxiety.    Marland Kitchen FLUoxetine (PROZAC) 40 MG capsule Take 20 mg by mouth daily.     . nitroGLYCERIN (NITROSTAT) 0.4 MG SL tablet Place 1 tablet (0.4 mg total) under the tongue every 5 (five) minutes as needed for chest pain. 25 tablet 3   No current  facility-administered medications on file prior to visit.         Objective:   Physical Exam Blood pressure 118/72, pulse 76, temperature 98.9 F (37.2 C), height 5\' 7"  (1.702 m), weight 162 lb 14.4 oz (73.9 kg). Alert and oriented. Skin warm and dry. Oral mucosa is moist.   . Sclera anicteric, conjunctivae is pink. Thyroid not enlarged. No cervical lymphadenopathy. Lungs clear. Heart regular rate and rhythm.  Abdomen is soft. Bowel sounds are positive. No hepatomegaly. No abdominal masses felt. No tenderness.  No edema to lower extremities. Patient is alert and oriented. Rectal exam: no masses felt. Stool brown and guaiac negative. No masses felt.          Assessment & Plan:  Rectal bleeding with straining. No masses felt. She was guaiac negative. Stool softener daily.  OV as needed.

## 2017-05-28 NOTE — Patient Instructions (Signed)
Stool softener. Fiber daily. Will be due for colonoscopy in 2023

## 2017-06-03 ENCOUNTER — Telehealth (INDEPENDENT_AMBULATORY_CARE_PROVIDER_SITE_OTHER): Payer: Self-pay | Admitting: Internal Medicine

## 2017-06-03 NOTE — Telephone Encounter (Signed)
Patient wants you to call her at [phone]

## 2017-06-03 NOTE — Telephone Encounter (Signed)
I discussed with Dr. Laural Golden. This is a second degree. She can wait to have the colonoscopy. Grandmother in her 40s with colon cancer and died at 20.

## 2017-07-28 ENCOUNTER — Telehealth: Payer: Self-pay

## 2017-07-28 ENCOUNTER — Emergency Department (HOSPITAL_COMMUNITY)
Admission: EM | Admit: 2017-07-28 | Discharge: 2017-07-28 | Disposition: A | Discharge status: Home / Self Care | Payer: BLUE CROSS/BLUE SHIELD | Attending: Emergency Medicine | Admitting: Emergency Medicine

## 2017-07-28 ENCOUNTER — Encounter (HOSPITAL_COMMUNITY): Payer: Self-pay | Admitting: Emergency Medicine

## 2017-07-28 ENCOUNTER — Emergency Department (HOSPITAL_COMMUNITY): Payer: BLUE CROSS/BLUE SHIELD

## 2017-07-28 ENCOUNTER — Other Ambulatory Visit: Payer: Self-pay

## 2017-07-28 DIAGNOSIS — Z7982 Long term (current) use of aspirin: Secondary | ICD-10-CM | POA: Insufficient documentation

## 2017-07-28 DIAGNOSIS — F17200 Nicotine dependence, unspecified, uncomplicated: Secondary | ICD-10-CM | POA: Insufficient documentation

## 2017-07-28 DIAGNOSIS — Z79899 Other long term (current) drug therapy: Secondary | ICD-10-CM | POA: Diagnosis not present

## 2017-07-28 DIAGNOSIS — R079 Chest pain, unspecified: Secondary | ICD-10-CM | POA: Diagnosis present

## 2017-07-28 LAB — BASIC METABOLIC PANEL
Anion gap: 7 (ref 5–15)
BUN: 15 mg/dL (ref 6–20)
CHLORIDE: 108 mmol/L (ref 98–111)
CO2: 26 mmol/L (ref 22–32)
Calcium: 9.1 mg/dL (ref 8.9–10.3)
Creatinine, Ser: 0.82 mg/dL (ref 0.44–1.00)
GFR calc non Af Amer: >60 mL/min (ref >60)
Glucose, Bld: 127 mg/dL — ABNORMAL HIGH (ref 70–99)
POTASSIUM: 3.6 mmol/L (ref 3.5–5.1)
SODIUM: 141 mmol/L (ref 135–145)

## 2017-07-28 LAB — CBC
HEMATOCRIT: 42.9 % (ref 36.0–46.0)
Hemoglobin: 14.3 g/dL (ref 12.0–15.0)
MCH: 33.4 pg (ref 26.0–34.0)
MCHC: 33.3 g/dL (ref 30.0–36.0)
MCV: 100.2 fL — AB (ref 78.0–100.0)
Platelets: 180 10*3/uL (ref 150–400)
RBC: 4.28 MIL/uL (ref 3.87–5.11)
RDW: 13 % (ref 11.5–15.5)
WBC: 7.3 10*3/uL (ref 4.0–10.5)

## 2017-07-28 LAB — TROPONIN I: Troponin I: <0.03 ng/mL (ref <0.03)

## 2017-07-28 MED ORDER — NITROGLYCERIN 0.4 MG SL SUBL: 0.4000 mg | SUBLINGUAL_TABLET | SUBLINGUAL | 0 refills | Status: DC | PRN

## 2017-07-28 MED ORDER — NITROGLYCERIN 0.4 MG SL SUBL
0.4000 mg | SUBLINGUAL_TABLET | Freq: Once | SUBLINGUAL | Status: AC
  Administered 2017-07-28: 0.4 mg via SUBLINGUAL
  Filled 2017-07-28: qty 1

## 2017-07-28 NOTE — ED Triage Notes (Signed)
Patient complaining of mid sternal chest pain radiating into left chest and shoulder x 3 weeks.

## 2017-07-28 NOTE — Telephone Encounter (Signed)
Patient contacted office stating she was having active chest pain (5-10) that was radiating to her left arm. Patient states she has been having these pains for about 2 weeks now. Patient also reports she has been having a lot of pressure in her chest and been very clammy. While on the phone patient states her chest pain has subsided some. Advised patient she needed to call 911 or go to the nearest ER. 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 Advised patient she really needs to be evaluated to see what is going on. Patient refused again but states if it gets worse she will go to the ER. Patient scheduled to see Dr. Bronson Ing tomorrow at 11:00

## 2017-07-28 NOTE — ED Provider Notes (Signed)
[facility] EMERGENCY DEPARTMENT Provider Note   CSN: [id number] Arrival date & time: 07/28/17  1421     History   Chief Complaint Chief Complaint  Patient presents with  . Chest Pain    HPI Kristy Lewis is a 57 y.o. female.  Intermittent chest pain for 3 weeks describes a pressure sensation with radiation to the left shoulder and back with questionable dyspnea, diaphoresis; no nausea.  Patient has been evaluated by cardiology within the past year with a negative nuclear stress test.  Past medical history includes cigarette smoking and hypercholesterolemia, but no diabetes or hypertension.  Symptoms are not associate with any activity.  Severity is mild to moderate.  Nitroglycerin gives her a bad headache.     Past Medical History:  Diagnosis Date  . Bipolar disorder ([place])   . Cancer ([place])   . Cardiac murmur   . Carpal tunnel syndrome   . Depressive disorder   . Dysphagia   . Esophageal reflux   . Genital herpes    at age 66  . Hypercholesteremia   . Migraines   . Urinary incontinence     Patient Active Problem List   Diagnosis Date Noted  . Rectal bleeding 09/15/2011    Past Surgical History:  Procedure Laterality Date  . ABDOMINAL HYSTERECTOMY     with bladder surgery. Ovaries remained  . COLONOSCOPY  10/15/2011   Procedure: COLONOSCOPY;  Surgeon: Rogene Houston, MD;  Location: [facility];  Service: Endoscopy;  Laterality: N/A;  200  . HEMORRHOID SURGERY    . left shoulder manipulation    . TONSILLECTOMY       OB History   None      Home Medications    Prior to Admission medications   Medication Sig Start Date End Date Taking? Authorizing Provider  aspirin EC 81 MG tablet Take 324 mg by mouth once.   Yes [provider]  diazepam (VALIUM) 2 MG tablet Take 2 mg by mouth every 6 (six) hours as needed for anxiety.   Yes [provider]  FLUoxetine (PROZAC) 40 MG capsule Take 40 mg by mouth daily.    Yes [provider]  nitroGLYCERIN (NITROSTAT) 0.4 MG SL tablet Place 1 tablet (0.4 mg total) under the tongue every 5 (five) minutes as needed for chest pain. 09/26/16 07/28/17 Yes Herminio Commons, MD  nitroGLYCERIN (NITROSTAT) 0.4 MG SL tablet Place 1 tablet (0.4 mg total) under the tongue every 5 (five) minutes as needed for chest pain. 07/28/17   Nat Christen, MD    Family History History reviewed. No pertinent family history.  Social History Social History   Tobacco Use  . Smoking status: Current Some Day Smoker  . Smokeless tobacco: Never Used  . Tobacco comment: quit 2 yrs ago after smoking for over 20 yrs.  Substance Use Topics  . Alcohol use: Yes    Comment: socially  . Drug use: No     Allergies   Morphine and related; Iodine; and Shellfish allergy   Review of Systems Review of Systems  All other systems reviewed and are negative.    Physical Exam Updated Vital Signs BP 114/62   Pulse 64   Temp 98.6 F (37 C) (Temporal)   Resp (!) 22   Ht 5\' 6"  (1.676 m)   Wt 75.3 kg (166 lb)   SpO2 96%   BMI 26.79 kg/m   Physical Exam  Constitutional: She is oriented to person, place, and  time. She appears well-developed and well-nourished.  HENT:  Head: Normocephalic and atraumatic.  Eyes: Conjunctivae are normal.  Neck: Neck supple.  Cardiovascular: Normal rate and regular rhythm.  Pulmonary/Chest: Effort normal and breath sounds normal.  Abdominal: Soft. Bowel sounds are normal.  Musculoskeletal: Normal range of motion.  Neurological: She is alert and oriented to person, place, and time.  Skin: Skin is warm and dry.  Psychiatric: She has a normal mood and affect. Her behavior is normal.  Nursing note and vitals reviewed.    ED Treatments / Results  Labs (all labs ordered are listed, but only abnormal results are displayed) Labs Reviewed  BASIC METABOLIC PANEL - Abnormal; Notable for the following components:      Result Value   Glucose, Bld 127 (*)    All  other components within normal limits  CBC - Abnormal; Notable for the following components:   MCV 100.2 (*)    All other components within normal limits  TROPONIN I    EKG EKG Interpretation  Date/Time:  Tuesday July 28 2017 14:29:55 EDT Ventricular Rate:  69 PR Interval:  174 QRS Duration: 100 QT Interval:  426 QTC Calculation: 456 R Axis:   66 Text Interpretation:  Normal sinus rhythm Low voltage QRS Incomplete right bundle branch block Borderline ECG Confirmed by Nat Christen [phone]) on 07/28/2017 9:28:41 PM   Radiology Dg Chest 2 View  Result Date: 07/28/2017 CLINICAL DATA:  Chest pain EXAM: CHEST - 2 VIEW COMPARISON:  10/01/2016 chest radiograph. FINDINGS: Stable cardiomediastinal silhouette with borderline mild cardiomegaly. No pneumothorax. No pleural effusion. No overt pulmonary edema. No acute consolidative airspace disease. Stable minimal scarring versus atelectasis in the anterior lower lungs. IMPRESSION: Stable minimal scarring versus atelectasis in the anterior lower lungs. Stable borderline mild cardiomegaly without overt pulmonary edema. Electronically Signed   By: Ilona Sorrel M.D.   On: 07/28/2017 15:15    Procedures Procedures (including critical care time)  Medications Ordered in ED Medications  nitroGLYCERIN (NITROSTAT) SL tablet 0.4 mg (0.4 mg Sublingual Given 07/28/17 1928)     Initial Impression / Assessment and Plan / ED Course  I have reviewed the triage vital signs and the nursing notes.  Pertinent labs & imaging results that were available during my care of the patient were reviewed by me and considered in my medical decision making (see chart for details).     Patient presents with intermittent chest pain for 3 weeks.  EKG and troponin negative.  Chest x-ray shows stable borderline mild cardiomegaly without pulmonary edema.  I reviewed the notes from cardiology.  Patient will follow up with a local cardiologist.  Prescription for nitroglycerin  given.  Final Clinical Impressions(s) / ED Diagnoses   Final diagnoses:  Chest pain, unspecified type    ED Discharge Orders        Ordered    nitroGLYCERIN (NITROSTAT) 0.4 MG SL tablet  Every 5 min PRN     07/28/17 2235       Nat Christen, MD 07/28/17 2254

## 2017-07-28 NOTE — Discharge Instructions (Signed)
Tests showed no life-threatening condition.  Recommend follow-up with cardiologist.  Prescription for nitroglycerin.  Stop smoking.

## 2017-07-28 NOTE — ED Notes (Signed)
ED Provider at bedside. 

## 2017-07-29 ENCOUNTER — Encounter: Payer: Self-pay | Admitting: *Deleted

## 2017-07-29 ENCOUNTER — Encounter: Payer: Self-pay | Admitting: Cardiovascular Disease

## 2017-07-29 ENCOUNTER — Ambulatory Visit: Payer: BLUE CROSS/BLUE SHIELD | Admitting: Cardiovascular Disease

## 2017-07-29 ENCOUNTER — Telehealth: Payer: Self-pay | Admitting: Cardiovascular Disease

## 2017-07-29 VITALS — BP 108/62 | HR 68 | Ht 66.0 in | Wt 164.0 lb

## 2017-07-29 DIAGNOSIS — R5383 Other fatigue: Secondary | ICD-10-CM

## 2017-07-29 DIAGNOSIS — R079 Chest pain, unspecified: Secondary | ICD-10-CM

## 2017-07-29 DIAGNOSIS — I517 Cardiomegaly: Secondary | ICD-10-CM | POA: Diagnosis not present

## 2017-07-29 DIAGNOSIS — Z9289 Personal history of other medical treatment: Secondary | ICD-10-CM | POA: Diagnosis not present

## 2017-07-29 NOTE — H&P (View-Only) (Signed)
SUBJECTIVE: The patient presents for past due follow-up.  She was evaluated in the ED yesterday for chest pain.  I reviewed all relevant documentation, labs, and studies.  Blood pressure, heart rate, and oxygen saturations were normal. Troponin was normal.  Hemoglobin and white blood cell count were normal.  Basic metabolic panel was unremarkable.  Chest x-ray showed stable minimal scarring versus atelectasis in the anterior lower lungs.  There was stable borderline mild cardiomegaly without overt pulmonary edema.  Previous echocardiogram was suggestive of pulmonary hypertension.  VQ scan on 10/01/2016 showed no ventilation or perfusion defects and was low probability for pulmonary embolus.  Echocardiogram 08/29/2016: Normal left ventricular systolic function and normal diastolic function with normal regional wall motion, LVEF 60 to 65%.  There was moderate left atrial and mild right atrial dilatation.  The right ventricle was moderately dilated with normal systolic function.  There was suggestion of right ventricular pressure overload.  There was mild tricuspid regurgitation.  There were mild abnormalities with pulmonary function testing on 10/08/2016.  Nuclear stress test on 09/11/2016 was normal, LVEF 69%.  I personally reviewed the ECG performed yesterday which demonstrates sinus rhythm with incomplete right bundle branch block.  She is here with 1 of her colleagues.  The patient has been experiencing left-sided chest pain for the last 3 weeks which occurs sporadically.  It radiates to both shoulders at times.  She does have some lightheadedness but denies syncope and palpitations.  She has a history of migraines which have also gotten worse in the last 3 weeks.  The chest pain is sometimes retrosternal and radiates to her back.  It is otherwise located in the left inframammary region.  She denies associated nausea.  She also snores and does have some morning headaches but denies morning  fatigue.   Social history: She is single. 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    Review of Systems: As per "subjective", otherwise negative.  Allergies  Allergen Reactions  . Morphine And Related Itching    Altered mental status "mean"  . Iodine Swelling and Rash  . Shellfish Allergy Swelling and Rash    Current Outpatient Medications  Medication Sig Dispense Refill  . aspirin EC 81 MG tablet Take 81 mg by mouth once.     . diazepam (VALIUM) 2 MG tablet Take 2 mg by mouth every 6 (six) hours as needed for anxiety.    Marland Kitchen FLUoxetine (PROZAC) 40 MG capsule Take 40 mg by mouth daily.     . nitroGLYCERIN (NITROSTAT) 0.4 MG SL tablet Place 1 tablet (0.4 mg total) under the tongue every 5 (five) minutes as needed for chest pain. 30 tablet 0   No current facility-administered medications for this visit.     Past Medical History:  Diagnosis Date  . Bipolar disorder ([place])   . Cancer ([place])   . Cardiac murmur   . Carpal tunnel syndrome   . Depressive disorder   . Dysphagia   . Esophageal reflux   . Genital herpes    at age 24  . Hypercholesteremia   . Migraines   . Urinary incontinence     Past Surgical History:  Procedure Laterality Date  . ABDOMINAL HYSTERECTOMY     with bladder surgery. Ovaries remained  . COLONOSCOPY  10/15/2011   Procedure: COLONOSCOPY;  Surgeon: Rogene Houston, MD;  Location: [facility];  Service:  Endoscopy;  Laterality: N/A;  200  . HEMORRHOID SURGERY    . left shoulder manipulation    . TONSILLECTOMY      Social History   Socioeconomic History  . Marital status: Divorced    Spouse name: Not on file  . Number of children: Not on file  . Years of education: Not on file  . Highest education level: Not on file  Occupational History  . Not on file  Social Needs  . Financial resource strain: Not on file  . Food insecurity:    Worry: Not on file    Inability: Not on file    . Transportation needs:    Medical: Not on file    Non-medical: Not on file  Tobacco Use  . Smoking status: Current Some Day Smoker  . Smokeless tobacco: Never Used  . Tobacco comment: quit 2 yrs ago after smoking for over 20 yrs.  Substance and Sexual Activity  . Alcohol use: Yes    Comment: socially  . Drug use: No  . Sexual activity: Not on file  Lifestyle  . Physical activity:    Days per week: Not on file    Minutes per session: Not on file  . Stress: Not on file  Relationships  . Social connections:    Talks on phone: Not on file    Gets together: Not on file    Attends religious service: Not on file    Active member of club or organization: Not on file    Attends meetings of clubs or organizations: Not on file    Relationship status: Not on file  . Intimate partner violence:    Fear of current or ex partner: Not on file    Emotionally abused: Not on file    Physically abused: Not on file    Forced sexual activity: Not on file  Other Topics Concern  . Not on file  Social History Narrative  . Not on file     Vitals:   07/29/17 1621  BP: 108/62  Pulse: 68  SpO2: 97%  Weight: 164 lb (74.4 kg)  Height: 5\' 6"  (1.676 m)    Wt Readings from Last 3 Encounters:  07/29/17 164 lb (74.4 kg)  07/28/17 166 lb (75.3 kg)  05/28/17 162 lb 14.4 oz (73.9 kg)     PHYSICAL EXAM General: NAD HEENT: Normal. Neck: No JVD, no thyromegaly. Lungs: Clear to auscultation bilaterally with normal respiratory effort. CV: Regular rate and rhythm, normal S1/S2, no X7/D5, 2/6 systolic murmur along left sternal border. No pretibial or periankle edema.  No carotid bruit.   Abdomen: Soft, nontender, no distention.  Neurologic: Alert and oriented.  Psych: Normal affect. Skin: Normal. Musculoskeletal: No gross deformities.    ECG: Reviewed above under Subjective   Labs: Lab Results  Component Value Date/Time   K 3.6 07/28/2017 02:47 PM   BUN 15 07/28/2017 02:47 PM    CREATININE 0.82 07/28/2017 02:47 PM   HGB 14.3 07/28/2017 02:47 PM     Lipids: No results found for: LDLCALC, LDLDIRECT, CHOL, TRIG, HDL     ASSESSMENT AND PLAN: 1.  Right ventricular enlargement with chest pain/pulmonary hypertension: I am concerned that she has pulmonary hypertension.  Left ventricular systolic and diastolic function were normal.  She has an incomplete right bundle branch block on ECG.  PFTs were only mildly abnormal.  VQ scan was low probability for pulmonary embolism.  I will arrange for right heart catheterization.  2.  Chest  pain and progressive fatigue: Normal nuclear stress test in August 2018 as noted above.  Symptoms may be due to pulmonary hypertension.  I will arrange for right heart catheterization.  I will also arrange for left heart catheterization and coronary angiography.  She currently takes aspirin 81 mg. Risks and benefits of cardiac catheterization have been discussed with the patient.  These include bleeding, infection, kidney damage, stroke, heart attack, death.  The patient understands these risks and is willing to proceed.  She has a reported allergy to iodine.  I will premedicate her appropriately.      Disposition: Follow up 4 to 6 weeks after cath  A high level of decision making was required for increased medical complexities.    Kate Sable, M.D., F.A.C.C.

## 2017-07-29 NOTE — Telephone Encounter (Signed)
Right & Left heart cath - Wednesday, 7/17 at 8:30 w/ Dr. Tamala Julian

## 2017-07-29 NOTE — Progress Notes (Signed)
SUBJECTIVE: The patient presents for past due follow-up.  She was evaluated in the ED yesterday for chest pain.  I reviewed all relevant documentation, labs, and studies.  Blood pressure, heart rate, and oxygen saturations were normal. Troponin was normal.  Hemoglobin and white blood cell count were normal.  Basic metabolic panel was unremarkable.  Chest x-ray showed stable minimal scarring versus atelectasis in the anterior lower lungs.  There was stable borderline mild cardiomegaly without overt pulmonary edema.  Previous echocardiogram was suggestive of pulmonary hypertension.  VQ scan on 10/01/2016 showed no ventilation or perfusion defects and was low probability for pulmonary embolus.  Echocardiogram 08/29/2016: Normal left ventricular systolic function and normal diastolic function with normal regional wall motion, LVEF 60 to 65%.  There was moderate left atrial and mild right atrial dilatation.  The right ventricle was moderately dilated with normal systolic function.  There was suggestion of right ventricular pressure overload.  There was mild tricuspid regurgitation.  There were mild abnormalities with pulmonary function testing on 10/08/2016.  Nuclear stress test on 09/11/2016 was normal, LVEF 69%.  I personally reviewed the ECG performed yesterday which demonstrates sinus rhythm with incomplete right bundle branch block.  She is here with 1 of her colleagues.  The patient has been experiencing left-sided chest pain for the last 3 weeks which occurs sporadically.  It radiates to both shoulders at times.  She does have some lightheadedness but denies syncope and palpitations.  She has a history of migraines which have also gotten worse in the last 3 weeks.  The chest pain is sometimes retrosternal and radiates to her back.  It is otherwise located in the left inframammary region.  She denies associated nausea.  She also snores and does have some morning headaches but denies morning  fatigue.   Social history: She is single. 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    Review of Systems: As per "subjective", otherwise negative.  Allergies  Allergen Reactions  . Morphine And Related Itching    Altered mental status "mean"  . Iodine Swelling and Rash  . Shellfish Allergy Swelling and Rash    Current Outpatient Medications  Medication Sig Dispense Refill  . aspirin EC 81 MG tablet Take 81 mg by mouth once.     . diazepam (VALIUM) 2 MG tablet Take 2 mg by mouth every 6 (six) hours as needed for anxiety.    Marland Kitchen FLUoxetine (PROZAC) 40 MG capsule Take 40 mg by mouth daily.     . nitroGLYCERIN (NITROSTAT) 0.4 MG SL tablet Place 1 tablet (0.4 mg total) under the tongue every 5 (five) minutes as needed for chest pain. 30 tablet 0   No current facility-administered medications for this visit.     Past Medical History:  Diagnosis Date  . Bipolar disorder ([place])   . Cancer ([place])   . Cardiac murmur   . Carpal tunnel syndrome   . Depressive disorder   . Dysphagia   . Esophageal reflux   . Genital herpes    at age 21  . Hypercholesteremia   . Migraines   . Urinary incontinence     Past Surgical History:  Procedure Laterality Date  . ABDOMINAL HYSTERECTOMY     with bladder surgery. Ovaries remained  . COLONOSCOPY  10/15/2011   Procedure: COLONOSCOPY;  Surgeon: Rogene Houston, MD;  [facility];  Service:  Endoscopy;  Laterality: N/A;  200  . HEMORRHOID SURGERY    . left shoulder manipulation    . TONSILLECTOMY      Social History   Socioeconomic History  . Marital status: Divorced    Spouse name: Not on file  . Number of children: Not on file  . Years of education: Not on file  . Highest education level: Not on file  Occupational History  . Not on file  Social Needs  . Financial resource strain: Not on file  . Food insecurity:    Worry: Not on file    Inability: Not on file    . Transportation needs:    Medical: Not on file    Non-medical: Not on file  Tobacco Use  . Smoking status: Current Some Day Smoker  . Smokeless tobacco: Never Used  . Tobacco comment: quit 2 yrs ago after smoking for over 20 yrs.  Substance and Sexual Activity  . Alcohol use: Yes    Comment: socially  . Drug use: No  . Sexual activity: Not on file  Lifestyle  . Physical activity:    Days per week: Not on file    Minutes per session: Not on file  . Stress: Not on file  Relationships  . Social connections:    Talks on phone: Not on file    Gets together: Not on file    Attends religious service: Not on file    Active member of club or organization: Not on file    Attends meetings of clubs or organizations: Not on file    Relationship status: Not on file  . Intimate partner violence:    Fear of current or ex partner: Not on file    Emotionally abused: Not on file    Physically abused: Not on file    Forced sexual activity: Not on file  Other Topics Concern  . Not on file  Social History Narrative  . Not on file     Vitals:   07/29/17 1621  BP: 108/62  Pulse: 68  SpO2: 97%  Weight: 164 lb (74.4 kg)  Height: 5\' 6"  (1.676 m)    Wt Readings from Last 3 Encounters:  07/29/17 164 lb (74.4 kg)  07/28/17 166 lb (75.3 kg)  05/28/17 162 lb 14.4 oz (73.9 kg)     PHYSICAL EXAM General: NAD HEENT: Normal. Neck: No JVD, no thyromegaly. Lungs: Clear to auscultation bilaterally with normal respiratory effort. CV: Regular rate and rhythm, normal S1/S2, no J6/B3, 2/6 systolic murmur along left sternal border. No pretibial or periankle edema.  No carotid bruit.   Abdomen: Soft, nontender, no distention.  Neurologic: Alert and oriented.  Psych: Normal affect. Skin: Normal. Musculoskeletal: No gross deformities.    ECG: Reviewed above under Subjective   Labs: Lab Results  Component Value Date/Time   K 3.6 07/28/2017 02:47 PM   BUN 15 07/28/2017 02:47 PM    CREATININE 0.82 07/28/2017 02:47 PM   HGB 14.3 07/28/2017 02:47 PM     Lipids: No results found for: LDLCALC, LDLDIRECT, CHOL, TRIG, HDL     ASSESSMENT AND PLAN: 1.  Right ventricular enlargement with chest pain/pulmonary hypertension: I am concerned that she has pulmonary hypertension.  Left ventricular systolic and diastolic function were normal.  She has an incomplete right bundle branch block on ECG.  PFTs were only mildly abnormal.  VQ scan was low probability for pulmonary embolism.  I will arrange for right heart catheterization.  2.  Chest  pain and progressive fatigue: Normal nuclear stress test in August 2018 as noted above.  Symptoms may be due to pulmonary hypertension.  I will arrange for right heart catheterization.  I will also arrange for left heart catheterization and coronary angiography.  She currently takes aspirin 81 mg. Risks and benefits of cardiac catheterization have been discussed with the patient.  These include bleeding, infection, kidney damage, stroke, heart attack, death.  The patient understands these risks and is willing to proceed.  She has a reported allergy to iodine.  I will premedicate her appropriately.      Disposition: Follow up 4 to 6 weeks after cath  A high level of decision making was required for increased medical complexities.    Kate Sable, M.D., F.A.C.C.

## 2017-07-29 NOTE — Patient Instructions (Addendum)
Medication Instructions:  Continue all current medications.  Labwork: none  Testing/Procedures: Your physician has requested that you have a cardiac catheterization. Cardiac catheterization is used to diagnose and/or treat various heart conditions. Doctors may recommend this procedure for a number of different reasons. The most common reason is to evaluate chest pain. Chest pain can be a symptom of coronary artery disease (CAD), and cardiac catheterization can show whether plaque is narrowing or blocking your heart's arteries. This procedure is also used to evaluate the valves, as well as measure the blood flow and oxygen levels in different parts of your heart. For further information please visit [email or web address]. Please follow instruction sheet, as given.  Follow-Up: 4-6 weeks   Any Other Special Instructions Will Be Listed Below (If Applicable).  If you need a refill on your cardiac medications before your next appointment, please call your pharmacy.

## 2017-08-03 ENCOUNTER — Telehealth: Payer: Self-pay | Admitting: *Deleted

## 2017-08-03 MED ORDER — PREDNISONE 50 MG PO TABS: ORAL_TABLET | ORAL | 0 refills | Status: DC

## 2017-08-03 NOTE — Telephone Encounter (Addendum)
Catheterization scheduled at [facility] for: Wednesday August 05, 2017 8:30 AM Verify arrival time and place: [place] Entrance A at: 6:30 AM  No solid food after midnight prior to cath, clear liquids until 5 AM day of procedure. Verify allergies in Epic Verify no diabetes medications.  AM meds can be  taken pre-cath with sip of water including: ASA 81 mg Prednisone 50 mg Benadryl 50 mg  Confirm patient has responsible person to drive home post procedure and for 24 hours after you arrive home  Discussed instructions for procedure with patient, including instructions for 13 hour prednisone and benadryl prep pre-procedure, she verbalized understanding, thanked me for call.

## 2017-08-04 NOTE — H&P (Signed)
Chest pain and RV enlargement for R and L heart cath arranged by Dr.Koneswaran

## 2017-08-05 ENCOUNTER — Encounter (HOSPITAL_COMMUNITY)
Admission: RE | Disposition: A | Discharge status: Home / Self Care | Payer: Self-pay | Source: Ambulatory Visit | Attending: Interventional Cardiology

## 2017-08-05 ENCOUNTER — Encounter (HOSPITAL_COMMUNITY): Payer: Self-pay | Admitting: Interventional Cardiology

## 2017-08-05 ENCOUNTER — Other Ambulatory Visit: Payer: Self-pay

## 2017-08-05 ENCOUNTER — Ambulatory Visit (HOSPITAL_COMMUNITY)
Admission: RE | Admit: 2017-08-05 | Discharge: 2017-08-05 | Disposition: A | Discharge status: Home / Self Care | Payer: BLUE CROSS/BLUE SHIELD | Source: Ambulatory Visit | Attending: Interventional Cardiology | Admitting: Interventional Cardiology

## 2017-08-05 DIAGNOSIS — F17200 Nicotine dependence, unspecified, uncomplicated: Secondary | ICD-10-CM | POA: Diagnosis not present

## 2017-08-05 DIAGNOSIS — I517 Cardiomegaly: Secondary | ICD-10-CM

## 2017-08-05 DIAGNOSIS — Z91013 Allergy to seafood: Secondary | ICD-10-CM | POA: Insufficient documentation

## 2017-08-05 DIAGNOSIS — Z8619 Personal history of other infectious and parasitic diseases: Secondary | ICD-10-CM | POA: Insufficient documentation

## 2017-08-05 DIAGNOSIS — Z9889 Other specified postprocedural states: Secondary | ICD-10-CM | POA: Diagnosis not present

## 2017-08-05 DIAGNOSIS — I4510 Unspecified right bundle-branch block: Secondary | ICD-10-CM | POA: Diagnosis not present

## 2017-08-05 DIAGNOSIS — Z7982 Long term (current) use of aspirin: Secondary | ICD-10-CM | POA: Diagnosis not present

## 2017-08-05 DIAGNOSIS — R0609 Other forms of dyspnea: Secondary | ICD-10-CM | POA: Diagnosis not present

## 2017-08-05 DIAGNOSIS — G5600 Carpal tunnel syndrome, unspecified upper limb: Secondary | ICD-10-CM | POA: Diagnosis not present

## 2017-08-05 DIAGNOSIS — Q248 Other specified congenital malformations of heart: Secondary | ICD-10-CM | POA: Insufficient documentation

## 2017-08-05 DIAGNOSIS — Z888 Allergy status to other drugs, medicaments and biological substances status: Secondary | ICD-10-CM | POA: Insufficient documentation

## 2017-08-05 DIAGNOSIS — G43909 Migraine, unspecified, not intractable, without status migrainosus: Secondary | ICD-10-CM | POA: Diagnosis not present

## 2017-08-05 DIAGNOSIS — R079 Chest pain, unspecified: Secondary | ICD-10-CM

## 2017-08-05 DIAGNOSIS — R0789 Other chest pain: Secondary | ICD-10-CM | POA: Diagnosis not present

## 2017-08-05 DIAGNOSIS — Z885 Allergy status to narcotic agent status: Secondary | ICD-10-CM | POA: Diagnosis not present

## 2017-08-05 DIAGNOSIS — Z90710 Acquired absence of both cervix and uterus: Secondary | ICD-10-CM | POA: Insufficient documentation

## 2017-08-05 DIAGNOSIS — F319 Bipolar disorder, unspecified: Secondary | ICD-10-CM | POA: Insufficient documentation

## 2017-08-05 DIAGNOSIS — K219 Gastro-esophageal reflux disease without esophagitis: Secondary | ICD-10-CM | POA: Diagnosis not present

## 2017-08-05 DIAGNOSIS — E7800 Pure hypercholesterolemia, unspecified: Secondary | ICD-10-CM | POA: Insufficient documentation

## 2017-08-05 DIAGNOSIS — Z79899 Other long term (current) drug therapy: Secondary | ICD-10-CM | POA: Diagnosis not present

## 2017-08-05 HISTORY — PX: RIGHT/LEFT HEART CATH AND CORONARY ANGIOGRAPHY: CATH118266

## 2017-08-05 LAB — POCT I-STAT 3, VENOUS BLOOD GAS (G3P V)
ACID-BASE DEFICIT: 5 mmol/L — AB (ref 0.0–2.0)
ACID-BASE DEFICIT: 5 mmol/L — AB (ref 0.0–2.0)
Acid-base deficit: 4 mmol/L — ABNORMAL HIGH (ref 0.0–2.0)
Acid-base deficit: 5 mmol/L — ABNORMAL HIGH (ref 0.0–2.0)
Acid-base deficit: 5 mmol/L — ABNORMAL HIGH (ref 0.0–2.0)
Acid-base deficit: 6 mmol/L — ABNORMAL HIGH (ref 0.0–2.0)
BICARBONATE: 20.1 mmol/L (ref 20.0–28.0)
BICARBONATE: 20.4 mmol/L (ref 20.0–28.0)
Bicarbonate: 19.6 mmol/L — ABNORMAL LOW (ref 20.0–28.0)
Bicarbonate: 19.8 mmol/L — ABNORMAL LOW (ref 20.0–28.0)
Bicarbonate: 20.1 mmol/L (ref 20.0–28.0)
Bicarbonate: 20.9 mmol/L (ref 20.0–28.0)
O2 SAT: 86 %
O2 Saturation: 76 %
O2 Saturation: 83 %
O2 Saturation: 88 %
O2 Saturation: 88 %
O2 Saturation: 95 %
PCO2 VEN: 35.1 mmHg — AB (ref 44.0–60.0)
PCO2 VEN: 37.5 mmHg — AB (ref 44.0–60.0)
PCO2 VEN: 37.5 mmHg — AB (ref 44.0–60.0)
PCO2 VEN: 38.9 mmHg — AB (ref 44.0–60.0)
PH VEN: 7.311 (ref 7.250–7.430)
PH VEN: 7.316 (ref 7.250–7.430)
PH VEN: 7.336 (ref 7.250–7.430)
PH VEN: 7.343 (ref 7.250–7.430)
PH VEN: 7.355 (ref 7.250–7.430)
PH VEN: 7.36 (ref 7.250–7.430)
PO2 VEN: 44 mmHg (ref 32.0–45.0)
PO2 VEN: 55 mmHg — AB (ref 32.0–45.0)
PO2 VEN: 56 mmHg — AB (ref 32.0–45.0)
PO2 VEN: 57 mmHg — AB (ref 32.0–45.0)
TCO2: 21 mmol/L — AB (ref 22–32)
TCO2: 21 mmol/L — AB (ref 22–32)
TCO2: 21 mmol/L — AB (ref 22–32)
TCO2: 22 mmol/L (ref 22–32)
TCO2: 21 mmol/L — ABNORMAL LOW (ref 22–32)
TCO2: 22 mmol/L (ref 22–32)
pCO2, Ven: 37.1 mmHg — ABNORMAL LOW (ref 44.0–60.0)
pCO2, Ven: 39.9 mmHg — ABNORMAL LOW (ref 44.0–60.0)
pO2, Ven: 51 mmHg — ABNORMAL HIGH (ref 32.0–45.0)
pO2, Ven: 79 mmHg — ABNORMAL HIGH (ref 32.0–45.0)

## 2017-08-05 LAB — POCT I-STAT 3, ART BLOOD GAS (G3+)
ACID-BASE DEFICIT: 5 mmol/L — AB (ref 0.0–2.0)
Bicarbonate: 20 mmol/L (ref 20.0–28.0)
O2 SAT: 94 %
TCO2: 21 mmol/L — ABNORMAL LOW (ref 22–32)
pCO2 arterial: 37 mmHg (ref 32.0–48.0)
pH, Arterial: 7.341 — ABNORMAL LOW (ref 7.350–7.450)
pO2, Arterial: 76 mmHg — ABNORMAL LOW (ref 83.0–108.0)

## 2017-08-05 SURGERY — RIGHT/LEFT HEART CATH AND CORONARY ANGIOGRAPHY
Anesthesia: LOCAL

## 2017-08-05 MED ORDER — SODIUM CHLORIDE 0.9 % WEIGHT BASED INFUSION: 1.0000 mL/kg/h | INTRAVENOUS | Status: DC

## 2017-08-05 MED ORDER — SODIUM CHLORIDE 0.9% FLUSH: 3.0000 mL | INTRAVENOUS | Status: DC | PRN

## 2017-08-05 MED ORDER — LIDOCAINE HCL (PF) 1 % IJ SOLN
INTRAMUSCULAR | Status: AC
  Filled 2017-08-05: qty 30

## 2017-08-05 MED ORDER — SODIUM CHLORIDE 0.9 % IV SOLN: 250.0000 mL | INTRAVENOUS | Status: DC | PRN

## 2017-08-05 MED ORDER — IOPAMIDOL (ISOVUE-370) INJECTION 76%
INTRAVENOUS | Status: DC | PRN
  Administered 2017-08-05: 105 mL via INTRA_ARTERIAL

## 2017-08-05 MED ORDER — ONDANSETRON HCL 4 MG/2ML IJ SOLN: 4.0000 mg | Freq: Four times a day (QID) | INTRAMUSCULAR | Status: DC | PRN

## 2017-08-05 MED ORDER — SODIUM CHLORIDE 0.9 % WEIGHT BASED INFUSION
3.0000 mL/kg/h | INTRAVENOUS | Status: DC
  Administered 2017-08-05: 3 mL/kg/h via INTRAVENOUS

## 2017-08-05 MED ORDER — LIDOCAINE HCL (PF) 1 % IJ SOLN
INTRAMUSCULAR | Status: DC | PRN
  Administered 2017-08-05 (×2): 2 mL via INTRADERMAL

## 2017-08-05 MED ORDER — ASPIRIN 81 MG PO CHEW
CHEWABLE_TABLET | ORAL | Status: AC
  Filled 2017-08-05: qty 1

## 2017-08-05 MED ORDER — ASPIRIN 81 MG PO CHEW
81.0000 mg | CHEWABLE_TABLET | ORAL | Status: AC
  Administered 2017-08-05: 81 mg via ORAL

## 2017-08-05 MED ORDER — HEPARIN (PORCINE) IN NACL 1000-0.9 UT/500ML-% IV SOLN
INTRAVENOUS | Status: AC
  Filled 2017-08-05: qty 1000

## 2017-08-05 MED ORDER — VERAPAMIL HCL 2.5 MG/ML IV SOLN
INTRAVENOUS | Status: AC
  Filled 2017-08-05: qty 2

## 2017-08-05 MED ORDER — FENTANYL CITRATE (PF) 100 MCG/2ML IJ SOLN
INTRAMUSCULAR | Status: DC | PRN
  Administered 2017-08-05: 50 ug via INTRAVENOUS

## 2017-08-05 MED ORDER — IOPAMIDOL (ISOVUE-370) INJECTION 76%
INTRAVENOUS | Status: AC
  Filled 2017-08-05: qty 100

## 2017-08-05 MED ORDER — VERAPAMIL HCL 2.5 MG/ML IV SOLN
INTRAVENOUS | Status: DC | PRN
  Administered 2017-08-05: 10 mL via INTRA_ARTERIAL

## 2017-08-05 MED ORDER — FENTANYL CITRATE (PF) 100 MCG/2ML IJ SOLN
INTRAMUSCULAR | Status: AC
  Filled 2017-08-05: qty 2

## 2017-08-05 MED ORDER — MIDAZOLAM HCL 2 MG/2ML IJ SOLN
INTRAMUSCULAR | Status: AC
  Filled 2017-08-05: qty 2

## 2017-08-05 MED ORDER — HEPARIN (PORCINE) IN NACL 1000-0.9 UT/500ML-% IV SOLN
INTRAVENOUS | Status: DC | PRN
  Administered 2017-08-05 (×2): 500 mL

## 2017-08-05 MED ORDER — SODIUM CHLORIDE 0.9% FLUSH: 3.0000 mL | Freq: Two times a day (BID) | INTRAVENOUS | Status: DC

## 2017-08-05 MED ORDER — SODIUM CHLORIDE 0.9 % IV SOLN: INTRAVENOUS | Status: DC

## 2017-08-05 MED ORDER — HEPARIN SODIUM (PORCINE) 1000 UNIT/ML IJ SOLN
INTRAMUSCULAR | Status: DC | PRN
  Administered 2017-08-05: 4000 [IU] via INTRAVENOUS

## 2017-08-05 MED ORDER — HEPARIN SODIUM (PORCINE) 1000 UNIT/ML IJ SOLN
INTRAMUSCULAR | Status: AC
  Filled 2017-08-05: qty 1

## 2017-08-05 MED ORDER — MIDAZOLAM HCL 2 MG/2ML IJ SOLN
INTRAMUSCULAR | Status: DC | PRN
  Administered 2017-08-05: 1 mg via INTRAVENOUS

## 2017-08-05 MED ORDER — ACETAMINOPHEN 325 MG PO TABS: 650.0000 mg | ORAL_TABLET | ORAL | Status: DC | PRN

## 2017-08-05 SURGICAL SUPPLY — 14 items
CATH BALLN WEDGE 5F 110CM (CATHETERS) ×1 IMPLANT
CATH INFINITI 5 FR JL3.5 (CATHETERS) ×1 IMPLANT
CATH INFINITI JR4 5F (CATHETERS) ×1 IMPLANT
COVER PRB 48X5XTLSCP FOLD TPE (BAG) IMPLANT
COVER PROBE 5X48 (BAG) ×2
DEVICE RAD COMP TR BAND LRG (VASCULAR PRODUCTS) ×1 IMPLANT
GLIDESHEATH SLEND A-KIT 6F 22G (SHEATH) ×1 IMPLANT
GUIDEWIRE INQWIRE 1.5J.035X260 (WIRE) IMPLANT
INQWIRE 1.5J .035X260CM (WIRE) ×2
KIT HEART LEFT (KITS) ×2 IMPLANT
PACK CARDIAC CATHETERIZATION (CUSTOM PROCEDURE TRAY) ×2 IMPLANT
SHEATH GLIDE SLENDER 4/5FR (SHEATH) ×1 IMPLANT
TRANSDUCER W/STOPCOCK (MISCELLANEOUS) ×2 IMPLANT
TUBING CIL FLEX 10 FLL-RA (TUBING) ×2 IMPLANT

## 2017-08-05 NOTE — Discharge Instructions (Signed)
Drink plenty of fluids over next 48 hours and keep right wrist elevated at heart level for 24 hours ° °Radial Site Care °Refer to this sheet in the next few weeks. These instructions provide you with information about caring for yourself after your procedure. Your health care provider may also give you more specific instructions. Your treatment has been planned according to current medical practices, but problems sometimes occur. Call your health care provider if you have any problems or questions after your procedure. °What can I expect after the procedure? °After your procedure, it is typical to have the following: °· Bruising at the radial site that usually fades within 1-2 weeks. °· Blood collecting in the tissue (hematoma) that may be painful to the touch. It should usually decrease in size and tenderness within 1-2 weeks. ° °Follow these instructions at home: °· Take medicines only as directed by your health care provider. °· You may shower 24-48 hours after the procedure or as directed by your health care provider. Remove the bandage (dressing) and gently wash the site with plain soap and water. Pat the area dry with a clean towel. Do not rub the site, because this may cause bleeding. °· Do not take baths, swim, or use a hot tub until your health care provider approves. °· Check your insertion site every day for redness, swelling, or drainage. °· Do not apply powder or lotion to the site. °· Do not flex or bend the affected arm for 24 hours or as directed by your health care provider. °· Do not push or pull heavy objects with the affected arm for 24 hours or as directed by your health care provider. °· Do not lift over 10 lb (4.5 kg) for 5 days after your procedure or as directed by your health care provider. °· Ask your health care provider when it is okay to: °? Return to work or school. °? Resume usual physical activities or sports. °? Resume sexual activity. °· Do not drive home if you are discharged the  same day as the procedure. Have someone else drive you. °· You may drive 24 hours after the procedure unless otherwise instructed by your health care provider. °· Do not operate machinery or power tools for 24 hours after the procedure. °· If your procedure was done as an outpatient procedure, which means that you went home the same day as your procedure, a responsible adult should be with you for the first 24 hours after you arrive home. °· Keep all follow-up visits as directed by your health care provider. This is important. °Contact a health care provider if: °· You have a fever. °· You have chills. °· You have increased bleeding from the radial site. Hold pressure on the site. °Get help right away if: °· You have unusual pain at the radial site. °· You have redness, warmth, or swelling at the radial site. °· You have drainage (other than a small amount of blood on the dressing) from the radial site. °· The radial site is bleeding, and the bleeding does not stop after 30 minutes of holding steady pressure on the site. °· Your arm or hand becomes pale, cool, tingly, or numb. °This information is not intended to replace advice given to you by your health care provider. Make sure you discuss any questions you have with your health care provider. °Document Released: 02/08/2010 Document Revised: 06/14/2015 Document Reviewed: 07/25/2013 °Elsevier Interactive Patient Education © 2018 Elsevier Inc. ° °

## 2017-08-05 NOTE — Interval H&P Note (Signed)
Cath Lab Visit (complete for each Cath Lab visit)  Clinical Evaluation Leading to the Procedure:   ACS: No.  Non-ACS:    Anginal Classification: CCS II  Anti-ischemic medical therapy: Minimal Therapy (1 class of medications)  Non-Invasive Test Results: No non-invasive testing performed  Prior CABG: No previous CABG      History and Physical Interval Note:  08/05/2017 8:47 AM  Kristy Lewis  has presented today for surgery, with the diagnosis of cp, pulmonary hypertension  The various methods of treatment have been discussed with the patient and family. After consideration of risks, benefits and other options for treatment, the patient has consented to  Procedure(s): RIGHT/LEFT HEART CATH AND CORONARY ANGIOGRAPHY (N/A) as a surgical intervention .  The patient's history has been reviewed, patient examined, no change in status, stable for surgery.  I have reviewed the patient's chart and labs.  Questions were answered to the patient's satisfaction.     Belva Crome III

## 2017-08-05 NOTE — Progress Notes (Signed)
Please arrange for cardiac CTA as she appears to have congenital heart disease by cath today. We need to clearly define anatomy so that next steps can be planned.

## 2017-08-07 ENCOUNTER — Telehealth: Payer: Self-pay | Admitting: *Deleted

## 2017-08-07 DIAGNOSIS — Q249 Congenital malformation of heart, unspecified: Secondary | ICD-10-CM

## 2017-08-07 NOTE — Telephone Encounter (Signed)
Author: Herminio Commons, MD Service: Cardiology Author Type: Physician  Filed: 08/05/2017 12:33 PM Date of Service: 08/05/2017 12:32 PM Status: Signed  Editor: Herminio Commons, MD (Physician)       Please arrange for cardiac CTA as she appears to have congenital heart disease by cath today. We need to clearly define anatomy so that next steps can be planned.

## 2017-08-07 NOTE — Telephone Encounter (Signed)
Patient notified and agrees to doing the cardiac CTA.  Will put orders into Epic & fwd to [facility] for scheduling.

## 2017-08-26 ENCOUNTER — Ambulatory Visit (HOSPITAL_COMMUNITY): Payer: BLUE CROSS/BLUE SHIELD

## 2017-08-26 ENCOUNTER — Ambulatory Visit (HOSPITAL_COMMUNITY)
Admission: RE | Admit: 2017-08-26 | Discharge: 2017-08-26 | Disposition: A | Discharge status: Home / Self Care | Payer: BLUE CROSS/BLUE SHIELD | Source: Ambulatory Visit | Attending: Cardiovascular Disease | Admitting: Cardiovascular Disease

## 2017-08-26 DIAGNOSIS — Q249 Congenital malformation of heart, unspecified: Secondary | ICD-10-CM | POA: Insufficient documentation

## 2017-08-26 DIAGNOSIS — R918 Other nonspecific abnormal finding of lung field: Secondary | ICD-10-CM | POA: Insufficient documentation

## 2017-08-26 DIAGNOSIS — R079 Chest pain, unspecified: Secondary | ICD-10-CM | POA: Diagnosis not present

## 2017-08-26 MED ORDER — IOPAMIDOL (ISOVUE-370) INJECTION 76%
100.0000 mL | Freq: Once | INTRAVENOUS | Status: AC | PRN
  Administered 2017-08-26: 100 mL via INTRAVENOUS

## 2017-08-26 MED ORDER — NITROGLYCERIN 0.4 MG SL SUBL
SUBLINGUAL_TABLET | SUBLINGUAL | Status: AC
  Filled 2017-08-26: qty 2

## 2017-08-26 MED ORDER — NITROGLYCERIN 0.4 MG SL SUBL
0.8000 mg | SUBLINGUAL_TABLET | Freq: Once | SUBLINGUAL | Status: AC
  Administered 2017-08-26: 0.8 mg via SUBLINGUAL
  Filled 2017-08-26: qty 25

## 2017-08-27 ENCOUNTER — Other Ambulatory Visit: Payer: Self-pay

## 2017-08-27 ENCOUNTER — Encounter: Payer: Self-pay | Admitting: Cardiovascular Disease

## 2017-08-27 ENCOUNTER — Ambulatory Visit: Payer: BLUE CROSS/BLUE SHIELD | Admitting: Cardiovascular Disease

## 2017-08-27 VITALS — BP 107/69 | HR 67 | Ht 66.0 in | Wt 167.0 lb

## 2017-08-27 DIAGNOSIS — I517 Cardiomegaly: Secondary | ICD-10-CM | POA: Diagnosis not present

## 2017-08-27 DIAGNOSIS — Q249 Congenital malformation of heart, unspecified: Secondary | ICD-10-CM | POA: Diagnosis not present

## 2017-08-27 DIAGNOSIS — R5383 Other fatigue: Secondary | ICD-10-CM

## 2017-08-27 DIAGNOSIS — R079 Chest pain, unspecified: Secondary | ICD-10-CM

## 2017-08-27 NOTE — Progress Notes (Signed)
SUBJECTIVE: The patient presents for follow-up after undergoing right and left heart catheterization and coronary angiography.  Coronary arteries were normal.  Pulmonary artery pressures were normal.  Left ventricular function was normal.  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  QP/QS was greater than 2.7: 1.  Dr. Tamala Julian discussed the case with Dr. Meda Coffee who recommended cardiac CT angiography.  Coronary CT showed no coronary disease.  There is no structural heart evaluation performed.  Echocardiogram 08/29/2016: Normal left ventricular systolic function and normal diastolic function with normal regional wall motion, LVEF 60 to 65%.  There was moderate left atrial and mild right atrial dilatation.  The right ventricle was moderately dilated with normal systolic function.  There was suggestion of right ventricular pressure overload.  There was mild tricuspid regurgitation.  She has retrosternal chest pains which radiate to the back.  She denies exertional dyspnea.  She does have fatigue and is unable to do things she was able to do 1 year ago.  She still works full-time.  She denies leg swelling, orthopnea, and paroxysmal nocturnal dyspnea.  She had a normal childhood and participated in track and field and was active.   Social history: Sheis single. 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  Review of Systems: As per "subjective", otherwise negative.  Allergies  Allergen Reactions  . Morphine And Related Itching    Altered mental status "mean"  . Iodine Swelling and Rash  . Shellfish Allergy Swelling and Rash    Current Outpatient Medications  Medication Sig Dispense Refill  . diazepam (VALIUM) 2 MG tablet Take 2 mg by mouth every 6 (six) hours as needed for anxiety.    Marland Kitchen FLUoxetine (PROZAC)  40 MG capsule Take 40 mg by mouth daily.     . nitroGLYCERIN (NITROSTAT) 0.4 MG SL tablet Place 1 tablet (0.4 mg total) under the tongue every 5 (five) minutes as needed for chest pain. 30 tablet 0  . aspirin EC 81 MG tablet Take 81 mg by mouth once.      No current facility-administered medications for this visit.     Past Medical History:  Diagnosis Date  . Bipolar disorder ([place])   . Cancer ([place])   . Cardiac murmur   . Carpal tunnel syndrome   . Depressive disorder   . Dysphagia   . Esophageal reflux   . Genital herpes    at age 33  . Hypercholesteremia   . Migraines   . Urinary incontinence     Past Surgical History:  Procedure Laterality Date  . ABDOMINAL HYSTERECTOMY     with bladder surgery. Ovaries remained  . COLONOSCOPY  10/15/2011   Procedure: COLONOSCOPY;  Surgeon: Rogene Houston, MD;  [facility];  Service: Endoscopy;  Laterality: N/A;  200  . HEMORRHOID SURGERY    . left shoulder manipulation    . RIGHT/LEFT HEART CATH AND CORONARY ANGIOGRAPHY N/A 08/05/2017   Procedure: RIGHT/LEFT HEART CATH AND CORONARY ANGIOGRAPHY;  Surgeon: Belva Crome, MD;  Location: [facility];  Service: Cardiovascular;  Laterality: N/A;  . TONSILLECTOMY      Social History   Socioeconomic History  . Marital status: Divorced    Spouse name: Not on file  . Number of children: Not on file  . Years of  education: Not on file  . Highest education level: Not on file  Occupational History  . Not on file  Social Needs  . Financial resource strain: Not on file  . Food insecurity:    Worry: Not on file    Inability: Not on file  . Transportation needs:    Medical: Not on file    Non-medical: Not on file  Tobacco Use  . Smoking status: Former Smoker    Last attempt to quit: 08/05/2017    Years since quitting: 0.0  . Smokeless tobacco: Never Used  . Tobacco comment: quit 2 yrs ago after smoking for over 20 yrs.  Substance and Sexual Activity  . Alcohol use: Yes      Comment: socially  . Drug use: No  . Sexual activity: Not on file  Lifestyle  . Physical activity:    Days per week: Not on file    Minutes per session: Not on file  . Stress: Not on file  Relationships  . Social connections:    Talks on phone: Not on file    Gets together: Not on file    Attends religious service: Not on file    Active member of club or organization: Not on file    Attends meetings of clubs or organizations: Not on file    Relationship status: Not on file  . Intimate partner violence:    Fear of current or ex partner: Not on file    Emotionally abused: Not on file    Physically abused: Not on file    Forced sexual activity: Not on file  Other Topics Concern  . Not on file  Social History Narrative  . Not on file     Vitals:   08/27/17 1531  BP: 107/69  Pulse: 67  SpO2: 98%  Weight: 167 lb (75.8 kg)  Height: 5\' 6"  (1.676 m)    Wt Readings from Last 3 Encounters:  08/27/17 167 lb (75.8 kg)  08/05/17 166 lb (75.3 kg)  07/29/17 164 lb (74.4 kg)     PHYSICAL EXAM General: NAD HEENT: Normal. Neck: No JVD, no thyromegaly. Lungs: Clear to auscultation bilaterally with normal respiratory effort. CV: Regular rate and rhythm, normal S1/S2, no Q3/E0, 2/6 systolic murmur along left sternal border. No pretibial or periankle edema.     Abdomen: Soft, nontender, no distention.  Neurologic: Alert and oriented.  Psych: Normal affect. Skin: Normal. Musculoskeletal: No gross deformities.    ECG: Reviewed above under Subjective   Labs: Lab Results  Component Value Date/Time   K 3.6 07/28/2017 02:47 PM   BUN 15 07/28/2017 02:47 PM   CREATININE 0.82 07/28/2017 02:47 PM   HGB 14.3 07/28/2017 02:47 PM     Lipids: No results found for: LDLCALC, LDLDIRECT, CHOL, TRIG, HDL     ASSESSMENT AND PLAN: 1.  Congenital heart disease: She continues to have episodic chest pains and exertional fatigue.  There was left to right shunting likely related to  partial anomalous pulmonary venous return, ASD (sinus venosus), or both.  QP/QS was greater than 2.7: 1.  I will arrange for cardiac MRI.  There is no evidence of pulmonary hypertension by right heart catheterization.     Disposition: Follow up 3 months   Kate Sable, M.D., F.A.C.C.

## 2017-08-27 NOTE — Patient Instructions (Addendum)
Medication Instructions:   Stop Aspirin.  Continue all other current medications.  Labwork: none  Testing/Procedures:  Your physician has requested that you have a cardiac MRI. Cardiac MRI uses a computer to create images of your heart as its beating, producing both still and moving pictures of your heart and major blood vessels. For further information please visit [email or web address] Please follow the instruction sheet given to you today for more information.  Office will contact with results via phone or letter.    Follow-Up: 3 months   Any Other Special Instructions Will Be Listed Below (If Applicable).  If you need a refill on your cardiac medications before your next appointment, please call your pharmacy.

## 2017-08-28 ENCOUNTER — Encounter: Payer: Self-pay | Admitting: Cardiovascular Disease

## 2017-09-11 ENCOUNTER — Telehealth: Payer: Self-pay | Admitting: *Deleted

## 2017-09-11 ENCOUNTER — Encounter: Payer: Self-pay | Admitting: Cardiovascular Disease

## 2017-09-11 DIAGNOSIS — Q211 Atrial septal defect, unspecified: Secondary | ICD-10-CM

## 2017-09-11 NOTE — Telephone Encounter (Signed)
Patient notified and verbalized understanding.  She agrees to seeing [facility].

## 2017-09-11 NOTE — Telephone Encounter (Signed)
Per Dr. Bronson Ing - please cancel MRI for this Monday.  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  Let patient know that we took a more detailed look at CT and found the problem.

## 2017-09-11 NOTE — Telephone Encounter (Signed)
MRI has been cancelled.

## 2017-09-14 ENCOUNTER — Ambulatory Visit (HOSPITAL_COMMUNITY): Payer: BLUE CROSS/BLUE SHIELD

## 2017-10-01 ENCOUNTER — Encounter: Payer: Self-pay | Admitting: Thoracic Surgery (Cardiothoracic Vascular Surgery)

## 2017-10-01 ENCOUNTER — Institutional Professional Consult (permissible substitution): Payer: BLUE CROSS/BLUE SHIELD | Admitting: Thoracic Surgery (Cardiothoracic Vascular Surgery)

## 2017-10-01 ENCOUNTER — Other Ambulatory Visit: Payer: Self-pay | Admitting: *Deleted

## 2017-10-01 DIAGNOSIS — Q211 Atrial septal defect: Secondary | ICD-10-CM | POA: Diagnosis not present

## 2017-10-01 DIAGNOSIS — I71019 Dissection of thoracic aorta, unspecified: Secondary | ICD-10-CM

## 2017-10-01 DIAGNOSIS — I7409 Other arterial embolism and thrombosis of abdominal aorta: Secondary | ICD-10-CM

## 2017-10-01 DIAGNOSIS — I7101 Dissection of thoracic aorta: Secondary | ICD-10-CM

## 2017-10-01 DIAGNOSIS — Q2116 Sinus venosus atrial septal defect, unspecified: Secondary | ICD-10-CM | POA: Insufficient documentation

## 2017-10-01 DIAGNOSIS — I779 Disorder of arteries and arterioles, unspecified: Secondary | ICD-10-CM

## 2017-10-01 NOTE — Progress Notes (Addendum)
[facility]  MULTIDISCIPLINARY HEART VALVE CLINIC  CARDIOTHORACIC SURGERY CONSULTATION REPORT  Referring Provider is Herminio Commons, MD PCP is Monico Blitz, MD  Chief Complaint  Patient presents with  . Consult    sinus venosus ASD    HPI:  Patient is a 57 year old female with history of atypical chest pain and migraine headaches referred for surgical consultation to discuss treatment options for management of sinus venosus atrial septal defect.  The patient describes a several year history of symptoms of substernal chest pain that occur both with activity and at rest.  Symptoms are described as a dull pressure-like discomfort across the chest that is typically exacerbated by physical activity and alleviated by rest but occasionally occurs with minimal activity or at rest.  Symptoms are sometimes associated with shortness of breath.  Patient has developed progressive fatigue and shortness of breath with more strenuous exertion.  She was initially evaluated by Dr. Bronson Ing more than a year ago where she underwent echocardiogram and stress echocardiogram.  Echocardiogram performed August 29, 2016 revealed normal left ventricular size and systolic function.  There was septal flattening suggesting right ventricular pressure overload in the right ventricle was moderately dilated.  The right atrium was mildly dilated.  There were no significant valvular abnormalities.  Stress echocardiography was performed and although the patient only reached 74% of target heart rate there was felt to be no evidence for ischemia.  The patient subsequently underwent nuclear medicine stress test that was felt to be low risk with ejection fraction calculated 69%.  The patient was initially lost to follow-up for a period of time, but she returned earlier this summer with worsening symptoms.  She underwent left and right heart catheterization by Dr. Tamala Julian on August 05, 2017.  She was found to have  normal coronary arteries with normal left ventricular size and systolic function.  Patient was noted to have a large left to right shunt at the atrial level with Qp/Qs measured greater than 2.7 to 1.  Cardiac gated CT angiogram of the heart was recommended to evaluate possible congenital heart disease such as anomalous pulmonary venous return and/or atrial septal defect.  The patient subsequently underwent cardiac gated CTA but it was ordered and performed as a coronary study and reported normal.  Study was reportedly subsequently reviewed and a sinus venosus atrial septal defect discovered.  The patient was referred for surgical consultation.  The patient is single and lives with 2 grandchildren and her adult daughter in [place].  She works full-time as a Designer, fashion/clothing.  She has remained healthy all of her life although she does smoke cigarettes.  She does not exercise on a regular basis.  She suffers from migraine headaches for which she frequently takes Imitrex.  She describes substernal chest discomfort made worse with exertion or stress but sometimes occurring at rest.  Symptoms often last between 10 and 20 minutes duration and can be associated with shortness of breath.  She denies any palpitations, dizzy spells, or syncope.  She denies any history of orthopnea or lower extremity edema.  In the absence of chest discomfort she gets short of breath only with more strenuous exertion.  Past Medical History:  Diagnosis Date  . Bipolar disorder ([place])   . Cancer ([place])   . Cardiac murmur   . Carpal tunnel syndrome   . Depressive disorder   . Dysphagia   . Esophageal reflux   . Genital herpes    at age 12  .  Hypercholesteremia   . Migraines   . Urinary incontinence     Past Surgical History:  Procedure Laterality Date  . ABDOMINAL HYSTERECTOMY     with bladder surgery. Ovaries remained  . COLONOSCOPY  10/15/2011   Procedure: COLONOSCOPY;  Surgeon: Rogene Houston, MD;   Location: [facility];  Service: Endoscopy;  Laterality: N/A;  200  . HEMORRHOID SURGERY    . left shoulder manipulation    . RIGHT/LEFT HEART CATH AND CORONARY ANGIOGRAPHY N/A 08/05/2017   Procedure: RIGHT/LEFT HEART CATH AND CORONARY ANGIOGRAPHY;  Surgeon: Belva Crome, MD;  Location: [facility];  Service: Cardiovascular;  Laterality: N/A;  . TONSILLECTOMY      Family History  Problem Relation Age of Onset  . Hypertension Mother     Social History   Socioeconomic History  . Marital status: Divorced    Spouse name: Not on file  . Number of children: Not on file  . Years of education: Not on file  . Highest education level: Not on file  Occupational History  . Not on file  Social Needs  . Financial resource strain: Not on file  . Food insecurity:    Worry: Not on file    Inability: Not on file  . Transportation needs:    Medical: Not on file    Non-medical: Not on file  Tobacco Use  . Smoking status: Former Smoker    Last attempt to quit: 08/05/2017    Years since quitting: 0.1  . Smokeless tobacco: Never Used  . Tobacco comment: quit 2 yrs ago after smoking for over 20 yrs.  Substance and Sexual Activity  . Alcohol use: Yes    Comment: socially  . Drug use: No  . Sexual activity: Not on file  Lifestyle  . Physical activity:    Days per week: Not on file    Minutes per session: Not on file  . Stress: Not on file  Relationships  . Social connections:    Talks on phone: Not on file    Gets together: Not on file    Attends religious service: Not on file    Active member of club or organization: Not on file    Attends meetings of clubs or organizations: Not on file    Relationship status: Not on file  . Intimate partner violence:    Fear of current or ex partner: Not on file    Emotionally abused: Not on file    Physically abused: Not on file    Forced sexual activity: Not on file  Other Topics Concern  . Not on file  Social History Narrative  .  Not on file    Current Outpatient Medications  Medication Sig Dispense Refill  . diazepam (VALIUM) 2 MG tablet Take 2 mg by mouth every 6 (six) hours as needed for anxiety.    Marland Kitchen FLUoxetine (PROZAC) 40 MG capsule Take 40 mg by mouth daily.     . nitroGLYCERIN (NITROSTAT) 0.4 MG SL tablet Place 1 tablet (0.4 mg total) under the tongue every 5 (five) minutes as needed for chest pain. 30 tablet 0  . SUMAtriptan (IMITREX) 50 MG tablet Take 50 mg by mouth every 2 (two) hours as needed for migraine. May repeat in 2 hours if headache persists or recurs.     No current facility-administered medications for this visit.     Allergies  Allergen Reactions  . Morphine And Related Itching    Altered mental status "  mean"  . Iodine Swelling and Rash  . Shellfish Allergy Swelling and Rash      Review of Systems:   General:  normal appetite, decreased energy, no weight gain, no weight loss, no fever  Cardiac:  + chest pain with exertion, + chest pain at rest, + SOB with exertion, no resting SOB, no PND, no orthopnea, no palpitations, no arrhythmia, no atrial fibrillation, no LE edema, no dizzy spells, no syncope  Respiratory:  no shortness of breath, no home oxygen, no productive cough, no dry cough, no bronchitis, no wheezing, no hemoptysis, no asthma, no pain with inspiration or cough, no sleep apnea, no CPAP at night  GI:   no difficulty swallowing, no reflux, no frequent heartburn, no hiatal hernia, no abdominal pain, no constipation, no diarrhea, no hematochezia, no hematemesis, no melena  GU:   no dysuria,  no frequency, no urinary tract infection, no hematuria, no kidney stones, no kidney disease  Vascular:  no pain suggestive of claudication, no pain in feet, no leg cramps, no varicose veins, no DVT, no non-healing foot ulcer  Neuro:   no stroke, no TIA's, no seizures, + migraine headaches, no temporary blindness one eye,  no slurred speech, no peripheral neuropathy but some numbness in both  hands, no chronic pain, no instability of gait, no memory/cognitive dysfunction  Musculoskeletal: no arthritis, no joint swelling, no myalgias, no difficulty walking, no mobility   Skin:   no rash, no itching, no skin infections, no pressure sores or ulcerations  Psych:   no anxiety, no depression, no nervousness, no unusual recent stress  Eyes:   no blurry vision, no floaters, no recent vision changes, + wears glasses or contacts  ENT:   no hearing loss, no loose or painful teeth, no dentures  Hematologic:  no easy bruising, no abnormal bleeding, no clotting disorder, no frequent epistaxis  Endocrine:  no diabetes, does not check CBG's at home           Physical Exam:   BP 124/72   Pulse 72   Resp 20   Ht 5\' 6"  (1.676 m)   Wt 166 lb (75.3 kg)   SpO2 96% Comment: RA  BMI 26.79 kg/m   General:    well-appearing  HEENT:  Unremarkable   Neck:   no JVD, no bruits, no adenopathy   Chest:   clear to auscultation, symmetrical breath sounds, no wheezes, no rhonchi   CV:   RRR, grade III/VI holosystolic murmur heard best at LUSB,  no diastolic murmur  Abdomen:  soft, non-tender, no masses   Extremities:  warm, well-perfused, pulses diminished, no LE edema  Rectal/GU  Deferred  Neuro:   Grossly non-focal and symmetrical throughout  Skin:   Clean and dry, no rashes, no breakdown   Diagnostic Tests:  Transthoracic Echocardiography  Patient:    Clarene, Curran MR #:       [id number] Study Date: 08/29/2016 Gender:     F Age:        29 Height:     167.6 cm Weight:     73 kg BSA:        1.86 m^2 Pt. Status: Room:   SONOGRAPHER  [facility]  ATTENDING    Kate Sable, MD  [place], MD  [place], MD  PERFORMING   Rayford Halsted  cc:  ------------------------------------------------------------------- LV EF: 60% -   65%  ------------------------------------------------------------------- Indications:  Chest pain 786.51.  ------------------------------------------------------------------- History:   PMH:  Former Smoker, GERD, Retrosternal chest pain, Emotional stress.  Murmur.  ------------------------------------------------------------------- Study Conclusions  - Left ventricle: The cavity size was normal. Wall thickness was   normal. Systolic function was normal. The estimated ejection   fraction was in the range of 60% to 65%. Wall motion was normal;   there were no regional wall motion abnormalities. Left   ventricular diastolic function parameters were normal. - Aortic valve: Mildly calcified annulus. Trileaflet; normal   thickness leaflets. Valve area (VTI): 2.24 cm^2. Valve area   (Vmax): 2.47 cm^2. - Left atrium: The atrium was moderately dilated. - Right ventricle: The ventricular septum is flattened in systole   suggesting RV pressure overload. The cavity size was moderately   dilated. Systolic function was normal. TAPSE: 28.1 mm . - Right atrium: The atrium was mildly dilated.  ------------------------------------------------------------------- Study data:  Comparison was made to the study of 12/04/2014.  Study status:  Routine.  Procedure:  Transthoracic echocardiography. Image quality was adequate.          Transthoracic echocardiography.  M-mode, complete 2D, spectral Doppler, and color Doppler.  Birthdate:  Patient birthdate: 1960-09-21.  Age:  Patient is 57 yr old.  Sex:  Gender: female.    BMI: 26 kg/m^2.  Patient status:  Outpatient.  Study date:  Study date: 08/29/2016. Study time: 11:01 AM.  Location:  Echo laboratory.  -------------------------------------------------------------------  ------------------------------------------------------------------- Left ventricle:  The cavity size was normal. Wall thickness was normal. Systolic function was normal. The estimated ejection fraction was in the range of 60% to 65%. Wall motion was normal; there  were no regional wall motion abnormalities. Left ventricular diastolic function parameters were normal.  ------------------------------------------------------------------- Aortic valve:   Mildly calcified annulus. Trileaflet; normal thickness leaflets.  Doppler:     VTI ratio of LVOT to aortic valve: 0.79. Valve area (VTI): 2.24 cm^2. Indexed valve area (VTI): 1.21 cm^2/m^2. Peak velocity ratio of LVOT to aortic valve: 0.87. Valve area (Vmax): 2.47 cm^2. Indexed valve area (Vmax): 1.33 cm^2/m^2.    Mean gradient (S): 3 mm Hg. Peak gradient (S): 6 mm Hg.  ------------------------------------------------------------------- Aorta:  Aortic root: The aortic root was normal in size.  ------------------------------------------------------------------- Mitral valve:   Normal thickness leaflets .  Doppler:   There was no evidence for stenosis.   There was no significant regurgitation.    Peak gradient (D): 3 mm Hg.  ------------------------------------------------------------------- Left atrium:  The atrium was moderately dilated.  ------------------------------------------------------------------- Right ventricle:  The ventricular septum is flattened in systole suggesting RV pressure overload. The cavity size was moderately dilated. Systolic function was normal.  ------------------------------------------------------------------- Pulmonic valve:   Not well visualized.  Doppler:   There was no evidence for stenosis.   There was no significant regurgitation.   ------------------------------------------------------------------- Tricuspid valve:   Normal thickness leaflets.  Doppler:   There was no evidence for stenosis.   There was mild regurgitation.  ------------------------------------------------------------------- Right atrium:  The atrium was mildly dilated.  ------------------------------------------------------------------- Pericardium:  There is a trivial  circumferential pericardial effusion.  ------------------------------------------------------------------- Systemic veins: Inferior vena cava: The vessel was normal in size. The respirophasic diameter changes were in the normal range (>= 50%), consistent with normal central venous pressure.  ------------------------------------------------------------------- Measurements   Left ventricle                          Value  Reference  LV ID, ED, PLAX chordal         (L)     34.7   mm       43 - 52  LV ID, ES, PLAX chordal                 25.5   mm       23 - 38  LV fx shortening, PLAX chordal  (L)     27     %        >=29  LV PW thickness, ED                     9      mm       ----------  IVS/LV PW ratio, ED                     1.09            <=1.3  LV ejection fraction, 1-p A4C           58     %        ----------  LV end-diastolic volume, 2-p            58     ml       ----------  LV end-systolic volume, 2-p             26     ml       ----------  LV ejection fraction, 2-p               56     %        ----------  Stroke volume, 2-p                      32     ml       ----------  LV end-diastolic volume/bsa,            31     ml/m^2   ----------  2-p  LV end-systolic volume/bsa, 2-p         14     ml/m^2   ----------  Stroke volume/bsa, 2-p                  17.4   ml/m^2   ----------  LV e&', lateral                          11.7   cm/s     ----------  LV E/e&', lateral                        7.01            ----------  LV e&', medial                           6.96   cm/s     ----------  LV E/e&', medial                         11.78           ----------  LV e&', average                          9.33   cm/s     ----------  LV E/e&', average                        8.79            ----------  LV ejection time                        350    ms       ----------    Ventricular septum                      Value           Reference  IVS thickness, ED                       9.85    mm       ----------    LVOT                                    Value           Reference  LVOT ID, S                              19     mm       ----------  LVOT area                               2.84   cm^2     ----------  LVOT peak velocity, S                   106.56 cm/s     ----------  LVOT VTI, S                             20.75  cm       ----------  Stroke volume (SV), LVOT DP             58.8   ml       ----------  Stroke index (SV/bsa), LVOT DP          31.7   ml/m^2   ----------    Aortic valve                            Value           Reference  Aortic valve peak velocity, S           122.06 cm/s     ----------  Aortic valve mean velocity, S           75.89  cm/s     ----------  Aortic valve VTI, S                     26.19  cm       ----------  Aortic mean gradient, S                 3      mm Hg    ----------  Aortic peak gradient, S                 6  mm Hg    ----------  VTI ratio, LVOT/AV                      0.79            ----------  Aortic valve area, VTI                  2.24   cm^2     ----------  Aortic valve area/bsa, VTI              1.21   cm^2/m^2 ----------  Velocity ratio, peak, LVOT/AV           0.87            ----------  Aortic valve area, peak                 2.47   cm^2     ----------  velocity  Aortic valve area/bsa, peak             1.33   cm^2/m^2 ----------  velocity    Aorta                                   Value           Reference  Aortic root ID, ED                      26     mm       ----------    Left atrium                             Value           Reference  LA ID, A-P, ES                          37     mm       ----------  LA volume/bsa, S                        34.2   ml/m^2   ----------    Mitral valve                            Value           Reference  Mitral E-wave peak velocity             82     cm/s     ----------  Mitral A-wave peak velocity             43.7   cm/s     ----------  Mitral deceleration time         (H)     250    ms       150 - 230  Mitral peak gradient, D                 3      mm Hg    ----------  Mitral E/A ratio, peak                  1.9             ----------    Right atrium  Value           Reference  RA ID, S-I, ES, A4C             (H)     50.2   mm       34 - 49  RA area, ES, A4C                        18.6   cm^2     8.3 - 19.5  RA volume, ES, A/L                      57.3   ml       ----------  RA volume/bsa, ES, A/L                  30.9   ml/m^2   ----------    Systemic veins                          Value           Reference  Estimated CVP                           3      mm Hg    ----------    Right ventricle                         Value           Reference  TAPSE                                   28.1   mm       ----------  RV pressure, S, DP              (H)     31     mm Hg    <=30  RV s&', lateral, S                       13.1   cm/s     ----------  Legend: (L)  and  (H)  mark values outside specified reference range.  ------------------------------------------------------------------- Prepared and Electronically Authenticated by  Kerry Hough, M.D. 2018-08-10T12:35:03    RIGHT/LEFT HEART CATH AND CORONARY ANGIOGRAPHY  Conclusion    Normal coronary arteries.  Normal LV size and function.  Normal pulmonary artery pressures.  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    Qp/Qs greater than 2.7:1 (using IVC O2 sat as mixed venous in calculation).  RECOMMENDATIONS:   Needs to have morphology identified to determine pulmonary vein drainage, and exclude ASD.  Discussed morphologic evaluation with Dr. Ena Dawley and the next step should be cardiac CT Angio.   No indication for antiplatelet therapy at this time.  Indications   Right ventricular  enlargement [I51.7 (ICD-10-CM)]  Dyspnea on exertion [R06.09 (ICD-10-CM)]  Chest pain at rest [R07.9 (ICD-10-CM)]  Procedural Details/Technique   Technical Details The right radial area was sterilely prepped and draped. Intravenous sedation with Versed and fentanyl was administered. 1% Xylocaine was infiltrated to achieve local analgesia. Using real-time vascular ultrasound, a double  wall stick with an angiocath was utilized to obtain intra-arterial access. A VUS image was saved for the permanent record.The modified Seldinger technique was used to place a 64F " Slender" sheath in the right radial artery. Weight based heparin was administered. Coronary angiography was done using 5 F catheters. Right coronary angiography was performed with a JR4. Left ventricular hemodymic recordings and angiography was done using the JR 4 catheter and hand injection. Left coronary angiography was performed with a JL 3.5 cm.  Right heart catheterization was performed by exchanging a previously placed antecubital IV angio-cath for a 5 French Slender sheath. 1% Xylocaine was used to locally nesthetize the area around the IV site. The IV catheter was wired using an .018 guidewire. The modified Seldinger technique was used to place the 5 [place] sheath. Double glove technique was used to enhance sterility. After sheath insertion, right heart cath was performed using a 5 French balloon tipped catheter and fluoroscopic guidance. Pressures were recorded in each chamber and in the pulmonary capillary wedge position.. The main pulmonary artery O2 saturation was sampled.   Hemostasis was achieved using a pneumatic band.  During this procedure the patient is administered a total of Versed 1 mg and Fentanyl 50 mg to achieve and maintain moderate conscious sedation. The patient's heart rate, blood pressure, and oxygen saturation are monitored continuously during the procedure. The period of conscious sedation is 46 minutes, of which I was  present face-to-face 100% of this time.   Estimated blood loss <50 mL.  During this procedure the patient was administered the following to achieve and maintain moderate conscious sedation: Versed 1 mg, Fentanyl 50 mcg, while the patient's heart rate, blood pressure, and oxygen saturation were continuously monitored. The period of conscious sedation was 46 minutes, of which I was present face-to-face 100% of this time.  Coronary Findings   Diagnostic  Dominance: Right  Left Circumflex  First Obtuse Marginal Branch  Vessel is large in size.  Second Obtuse Marginal Branch  Vessel is small in size.  Third Obtuse Marginal Branch  Vessel is small in size.  Intervention   No interventions have been documented.  Right Heart   Right Heart Pressures Hemodynamic findings consistent with pulmonary hypertension. LV EDP is normal.  Right Ventricle The right ventricle is dilated.  Wall Motion   Resting               Left Heart   Left Ventricle The left ventricular size is normal. The left ventricular systolic function is normal. LV end diastolic pressure is normal. The left ventricular ejection fraction is greater than 65% by visual estimate. No regional wall motion abnormalities.  Coronary Diagrams   Diagnostic Diagram       Implants    No implant documentation for this case.  MERGE Images   Show images for CARDIAC CATHETERIZATION   Link to Procedure Log   Procedure Log    Hemo Data    Most Recent Value  Fick Cardiac Output 42.08 L/min  Fick Cardiac Output Index 22.81 (L/min)/BSA  RA A Wave 14 mmHg  RA V Wave 13 mmHg  RA Mean 11 mmHg  RV Systolic Pressure 36 mmHg  RV Diastolic Pressure 5 mmHg  RV EDP 14 mmHg  PA Systolic Pressure 31 mmHg  PA Diastolic Pressure 14 mmHg  PA Mean 22 mmHg  PW A Wave 19 mmHg  PW V Wave 25 mmHg  PW Mean 17 mmHg  AO Systolic Pressure 063 mmHg  AO Diastolic  Pressure 79 mmHg  AO Mean 782 mmHg  LV Systolic Pressure 956 mmHg  LV  Diastolic Pressure 11 mmHg  LV EDP 19 mmHg  AOp Systolic Pressure 213 mmHg  AOp Diastolic Pressure 77 mmHg  AOp Mean Pressure 086 mmHg  LVp Systolic Pressure 578 mmHg  LVp Diastolic Pressure 11 mmHg  LVp EDP Pressure 18 mmHg  TSVR Index 4.47 HRUI    Cardiac CTA  MEDICATIONS: Sub lingual nitro. 4mg  x 2 and lopressor 5mg  IV  TECHNIQUE: The patient was scanned on a Siemens 469 slice scanner. Gantry rotation speed was 250 msecs. Collimation was 0.8 mm. A 100 kV prospective scan was triggered in the ascending thoracic aorta at 35-75% of the R-R interval. Average HR during the scan was 60 bpm. The 3D data set was interpreted on a dedicated work station using MPR, MIP and VRT modes. A total of 80cc of contrast was used.  FINDINGS: Non-cardiac: See separate report from [facility].  Calcium Score: 0 Agatston units.  Coronary Arteries: Right dominant with no anomalies  LM: No plaque or stenosis.  LAD system: No plaque or stenosis.  Circumflex system: Relatively small vessel, distal LCx is quite small and not well visualized. However, no plaque or stenosis seen.  RCA system: The field of view used for this study was too small and a small segment of the distal RCA was cut out of the study. Besides this small segment, there is no plaque or stenosis.  IMPRESSION: 1. Coronary artery calcium score 0 Agatston units, suggesting low risk for future cardiac events.  2. No plaque or stenosis noted in the coronary arteries. One small section of the distal RCA was not visualized as the field of view for the study was too small.  Dalton Mclean   Electronically Signed   By: Loralie Champagne M.D.   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  I have personally reviewed the patient's recent diagnostic cardiac catheterization, the echocardiogram performed in 2018, and her  CT angiogram.  Still images demonstrating the anatomy of her congenital heart defect have not been stated in the PACS system but using Tera Re[employer] it appears that the patient does have a sinus stenosis type atrial septal defect with anomalous right superior pulmonary vein.  It is difficult to tell for certain whether or not it might be multiple veins involved and further review of the scan will be necessary to facilitate surgical planning.  If complete review of the anatomy is not feasible on this exam cardiac MRI may be necessary.  However, there do not appear to be any complicating features and diagnostic cardiac catheterization is notable for the presence of significant coronary artery disease.  Patient appears to be a good candidate for minimally invasive approach for surgery.   Plan:  I have discussed at length the anatomical findings associated with sinus venosis type atrial septal defect and partially anomalous pulmonary venous return with the patient in the office today.  Pathophysiology of intracardiac shunt and the natural history of the disease process was explained.  The indications, risk, and potential benefits of surgical repair were discussed.  Risks specifically related to surgical correction including the possibility of sinus node dysfunction, need for permanent pacemaker placement, and irregular heart rhythms were emphasized.  The patient understands and accepts all potential potential associated risks of surgery including but not limited to risk of death, stroke, myocardial infarction, congestive  heart failure, respiratory failure, renal failure, bleeding requiring blood transfusion and/or reexploration, arrhythmia, heart block or bradycardia requiring permanent pacemaker, pneumonia, pleural effusion, wound infection, pulmonary embolus or other thromboembolic complication, chronic pain or other delayed complications.  Alternative surgical approaches have been discussed including a comparison  between conventional sternotomy and minimally-invasive techniques.  The relative risks and benefits of each have been reviewed as they pertain to the patient's specific circumstances, and all of their questions have been addressed.  Specific risks potentially related to the minimally-invasive approach were discussed at length, including but not limited to risk of conversion to full or partial sternotomy, aortic dissection or other major vascular complication, unilateral acute lung injury or pulmonary edema, phrenic nerve dysfunction or paralysis, rib fracture, chronic pain, lung hernia, or lymphocele.   All questions answered.  We tentatively plan for surgery on October 21, 2017.  Prior to surgery the patient's cardiac gated CT angiogram will be reviewed at length to determine whether or not cardiac MRI will be necessary for planning purposes.  The patient will undergo CT angiography of the aorta and iliac vessels to evaluate the feasibility of peripheral arterial cannulation for surgery.  All of her questions were answered.  Patient will return to our office for follow-up prior to surgery on October 19, 2017.    I spent in excess of 90 minutes during the conduct of this office consultation and >50% of this time involved direct face-to-face encounter with the patient for counseling and/or coordination of their care.    Valentina Gu. [name], MD 10/01/2017 3:10 PM   I personally reviewed the patient's cardiac gated CT angiogram with Dr. Meda Coffee.  The patient clearly has a sinus venosus type atrial septal defect with partial anomalous pulmonary venous return.  There is a medium size pulmonary vein draining into the superior vena cava approximately 1 to 2 cm above the level of the junction between the superior vena cava and the right atrium.  It is unclear whether or not this represents the entire drainage of the superior pulmonary vein.  The remainder of the pulmonary veins drained appropriately into the left  atrium.  No other obvious abnormalities were noted.  We will plan to perform the patient's upcoming CT angiogram of the chest, abdomen, and pelvis using cardiac gating to minimize motion artifact so that we can visualize the entire pulmonary venous drainage of the right lung more clearly.    Rexene Alberts, MD 10/01/2017 6:21 PM

## 2017-10-01 NOTE — Patient Instructions (Signed)
Continue all previous medications without any changes at this time  

## 2017-10-02 ENCOUNTER — Other Ambulatory Visit: Payer: Self-pay | Admitting: *Deleted

## 2017-10-02 DIAGNOSIS — Q211 Atrial septal defect, unspecified: Secondary | ICD-10-CM

## 2017-10-02 DIAGNOSIS — I71019 Dissection of thoracic aorta, unspecified: Secondary | ICD-10-CM

## 2017-10-02 DIAGNOSIS — I7409 Other arterial embolism and thrombosis of abdominal aorta: Secondary | ICD-10-CM

## 2017-10-02 DIAGNOSIS — I7101 Dissection of thoracic aorta: Secondary | ICD-10-CM

## 2017-10-02 NOTE — Progress Notes (Unsigned)
ct 

## 2017-10-05 ENCOUNTER — Encounter: Payer: Self-pay | Admitting: *Deleted

## 2017-10-09 ENCOUNTER — Other Ambulatory Visit: Payer: BLUE CROSS/BLUE SHIELD

## 2017-10-19 ENCOUNTER — Ambulatory Visit: Payer: BLUE CROSS/BLUE SHIELD | Admitting: Thoracic Surgery (Cardiothoracic Vascular Surgery)

## 2017-10-19 ENCOUNTER — Encounter: Payer: Self-pay | Admitting: Thoracic Surgery (Cardiothoracic Vascular Surgery)

## 2017-10-19 ENCOUNTER — Encounter (HOSPITAL_COMMUNITY): Payer: Self-pay

## 2017-10-19 ENCOUNTER — Encounter (HOSPITAL_COMMUNITY)
Admission: RE | Admit: 2017-10-19 | Discharge: 2017-10-19 | Disposition: A | Discharge status: Home / Self Care | Payer: BLUE CROSS/BLUE SHIELD | Source: Ambulatory Visit | Attending: Thoracic Surgery (Cardiothoracic Vascular Surgery) | Admitting: Thoracic Surgery (Cardiothoracic Vascular Surgery)

## 2017-10-19 ENCOUNTER — Ambulatory Visit (HOSPITAL_COMMUNITY): Payer: BLUE CROSS/BLUE SHIELD

## 2017-10-19 ENCOUNTER — Other Ambulatory Visit: Payer: Self-pay

## 2017-10-19 ENCOUNTER — Ambulatory Visit (HOSPITAL_COMMUNITY)
Admission: RE | Admit: 2017-10-19 | Discharge: 2017-10-19 | Disposition: A | Discharge status: Home / Self Care | Payer: BLUE CROSS/BLUE SHIELD | Source: Ambulatory Visit | Attending: Thoracic Surgery (Cardiothoracic Vascular Surgery) | Admitting: Thoracic Surgery (Cardiothoracic Vascular Surgery)

## 2017-10-19 VITALS — BP 136/85 | HR 65 | Resp 16 | Ht 66.0 in | Wt 166.0 lb

## 2017-10-19 DIAGNOSIS — Q211 Atrial septal defect, unspecified: Secondary | ICD-10-CM

## 2017-10-19 DIAGNOSIS — I71019 Dissection of thoracic aorta, unspecified: Secondary | ICD-10-CM

## 2017-10-19 DIAGNOSIS — I6523 Occlusion and stenosis of bilateral carotid arteries: Secondary | ICD-10-CM | POA: Insufficient documentation

## 2017-10-19 DIAGNOSIS — J9811 Atelectasis: Secondary | ICD-10-CM | POA: Diagnosis not present

## 2017-10-19 DIAGNOSIS — I7101 Dissection of thoracic aorta: Secondary | ICD-10-CM

## 2017-10-19 DIAGNOSIS — Z01818 Encounter for other preprocedural examination: Secondary | ICD-10-CM | POA: Insufficient documentation

## 2017-10-19 DIAGNOSIS — I7409 Other arterial embolism and thrombosis of abdominal aorta: Secondary | ICD-10-CM

## 2017-10-19 DIAGNOSIS — I779 Disorder of arteries and arterioles, unspecified: Secondary | ICD-10-CM

## 2017-10-19 DIAGNOSIS — I517 Cardiomegaly: Secondary | ICD-10-CM | POA: Diagnosis not present

## 2017-10-19 DIAGNOSIS — I700 Atherosclerosis of aorta: Secondary | ICD-10-CM | POA: Insufficient documentation

## 2017-10-19 HISTORY — DX: Dyspnea, unspecified: R06.00

## 2017-10-19 HISTORY — DX: Angina pectoris, unspecified: I20.9

## 2017-10-19 LAB — COMPREHENSIVE METABOLIC PANEL
ALT: 17 U/L (ref 0–44)
AST: 22 U/L (ref 15–41)
Albumin: 3.8 g/dL (ref 3.5–5.0)
Alkaline Phosphatase: 52 U/L (ref 38–126)
Anion gap: 10 (ref 5–15)
BILIRUBIN TOTAL: 0.6 mg/dL (ref 0.3–1.2)
BUN: 11 mg/dL (ref 6–20)
CO2: 24 mmol/L (ref 22–32)
CREATININE: 0.81 mg/dL (ref 0.44–1.00)
Calcium: 9.2 mg/dL (ref 8.9–10.3)
Chloride: 106 mmol/L (ref 98–111)
Glucose, Bld: 88 mg/dL (ref 70–99)
POTASSIUM: 4.3 mmol/L (ref 3.5–5.1)
Sodium: 140 mmol/L (ref 135–145)
TOTAL PROTEIN: 6.9 g/dL (ref 6.5–8.1)

## 2017-10-19 LAB — URINALYSIS, ROUTINE W REFLEX MICROSCOPIC
BILIRUBIN URINE: NEGATIVE
Glucose, UA: NEGATIVE mg/dL
KETONES UR: NEGATIVE mg/dL
LEUKOCYTES UA: NEGATIVE
NITRITE: NEGATIVE
PROTEIN: NEGATIVE mg/dL
Specific Gravity, Urine: 1.005 (ref 1.005–1.030)
pH: 6 (ref 5.0–8.0)

## 2017-10-19 LAB — TYPE AND SCREEN
ABO/RH(D): O POS
Antibody Screen: NEGATIVE

## 2017-10-19 LAB — CBC
HEMATOCRIT: 43.3 % (ref 36.0–46.0)
HEMOGLOBIN: 14 g/dL (ref 12.0–15.0)
MCH: 33.1 pg (ref 26.0–34.0)
MCHC: 32.3 g/dL (ref 30.0–36.0)
MCV: 102.4 fL — ABNORMAL HIGH (ref 78.0–100.0)
Platelets: 172 10*3/uL (ref 150–400)
RBC: 4.23 MIL/uL (ref 3.87–5.11)
RDW: 13 % (ref 11.5–15.5)
WBC: 7.4 10*3/uL (ref 4.0–10.5)

## 2017-10-19 LAB — BLOOD GAS, ARTERIAL
Acid-Base Excess: 0.7 mmol/L (ref 0.0–2.0)
BICARBONATE: 24.2 mmol/L (ref 20.0–28.0)
Drawn by: 449841
FIO2: 21
O2 Saturation: 98.7 %
PATIENT TEMPERATURE: 98.6
PH ART: 7.455 — AB (ref 7.350–7.450)
pCO2 arterial: 34.9 mmHg (ref 32.0–48.0)
pO2, Arterial: 120 mmHg — ABNORMAL HIGH (ref 83.0–108.0)

## 2017-10-19 LAB — PROTIME-INR
INR: 0.91
PROTHROMBIN TIME: 12.1 s (ref 11.4–15.2)

## 2017-10-19 LAB — ABO/RH: ABO/RH(D): O POS

## 2017-10-19 LAB — SURGICAL PCR SCREEN
MRSA, PCR: NEGATIVE
STAPHYLOCOCCUS AUREUS: NEGATIVE

## 2017-10-19 LAB — APTT: aPTT: 30 seconds (ref 24–36)

## 2017-10-19 MED ORDER — IOPAMIDOL (ISOVUE-370) INJECTION 76%
100.0000 mL | Freq: Once | INTRAVENOUS | Status: AC | PRN
  Administered 2017-10-19: 100 mL via INTRAVENOUS

## 2017-10-19 NOTE — H&P (Signed)
[address]       [place] [address]             [phone]          CARDIOTHORACIC SURGERY HISTORY AND PHYSICAL EXAM  Referring Provider is Herminio Commons, MD PCP is Monico Blitz, MD      Chief Complaint  Patient presents with  . Consult    sinus venosus ASD    HPI:  Patient is a 57 year old female with history of atypical chest pain and migraine headaches referred for surgical consultation to discuss treatment options for management of sinus venosus atrial septal defect.  The patient describes a several year history of symptoms of substernal chest pain that occur both with activity and at rest.  Symptoms are described as a dull pressure-like discomfort across the chest that is typically exacerbated by physical activity and alleviated by rest but occasionally occurs with minimal activity or at rest.  Symptoms are sometimes associated with shortness of breath.  Patient has developed progressive fatigue and shortness of breath with more strenuous exertion.  She was initially evaluated by Dr. Bronson Ing more than a year ago where she underwent echocardiogram and stress echocardiogram.  Echocardiogram performed August 29, 2016 revealed normal left ventricular size and systolic function.  There was septal flattening suggesting right ventricular pressure overload in the right ventricle was moderately dilated.  The right atrium was mildly dilated.  There were no significant valvular abnormalities.  Stress echocardiography was performed and although the patient only reached 74% of target heart rate there was felt to be no evidence for ischemia.  The patient subsequently underwent nuclear medicine stress test that was felt to be low risk with ejection fraction calculated 69%.  The patient was initially lost to follow-up for a period of time, but she returned earlier this summer with worsening symptoms.  She underwent left and right heart catheterization by Dr.  Tamala Julian on August 05, 2017.  She was found to have normal coronary arteries with normal left ventricular size and systolic function.  Patient was noted to have a large left to right shunt at the atrial level with Qp/Qs measured greater than 2.7 to 1.  Cardiac gated CT angiogram of the heart was recommended to evaluate possible congenital heart disease such as anomalous pulmonary venous return and/or atrial septal defect.  The patient subsequently underwent cardiac gated CTA but it was ordered and performed as a coronary study and reported normal.  Study was reportedly subsequently reviewed and a sinus venosus atrial septal defect discovered.  The patient was referred for surgical consultation.  The patient is single and lives with 2 grandchildren and her adult daughter in [place].  She works full-time as a Designer, fashion/clothing.  She has remained healthy all of her life although she does smoke cigarettes.  She does not exercise on a regular basis.  She suffers from migraine headaches for which she frequently takes Imitrex.  She describes substernal chest discomfort made worse with exertion or stress but sometimes occurring at rest.  Symptoms often last between 10 and 20 minutes duration and can be associated with shortness of breath.  She denies any palpitations, dizzy spells, or syncope.  She denies any history of orthopnea or lower extremity edema.  In the absence of chest discomfort she gets short of breath only with more strenuous exertion.   Past Medical History:  Diagnosis Date  . Anginal pain ([place])    Comes and  goes, pt states its normal to have every day   . ASD (atrial septal defect), sinus venosus defect   . Bipolar disorder ([place])    pt denies  . Cardiac murmur   . Carpal tunnel syndrome   . Depressive disorder   . Dysphagia   . Dyspnea    Upon exertion  . Esophageal reflux   . Genital herpes    at age 16  . Hypercholesteremia   . Migraines   . Urinary incontinence      Past Surgical History:  Procedure Laterality Date  . ABDOMINAL HYSTERECTOMY     with bladder surgery. Ovaries remained  . COLONOSCOPY  10/15/2011   Procedure: COLONOSCOPY;  Surgeon: Rogene Houston, MD;  Location: [facility];  Service: Endoscopy;  Laterality: N/A;  200  . HEMORRHOID SURGERY    . left shoulder manipulation    . RIGHT/LEFT HEART CATH AND CORONARY ANGIOGRAPHY N/A 08/05/2017   Procedure: RIGHT/LEFT HEART CATH AND CORONARY ANGIOGRAPHY;  Surgeon: Belva Crome, MD;  Location: [facility];  Service: Cardiovascular;  Laterality: N/A;  . TONSILLECTOMY      Family History  Problem Relation Age of Onset  . Hypertension Mother     Social History Social History   Tobacco Use  . Smoking status: Former Smoker    Packs/day: 0.50    Last attempt to quit: 08/05/2017    Years since quitting: 0.2  . Smokeless tobacco: Never Used  . Tobacco comment: quit 2 yrs ago after smoking for over 20 yrs.  Substance Use Topics  . Alcohol use: Yes    Alcohol/week: 2.0 - 3.0 standard drinks    Types: 2 - 3 Glasses of wine per week    Comment: socially  . Drug use: No    Prior to Admission medications   Medication Sig Start Date End Date Taking? Authorizing Provider  Ascorbic Acid (VITAMIN C PO) Take 1 tablet by mouth daily.   Yes [provider]  diazepam (VALIUM) 2 MG tablet Take 2 mg by mouth 2 (two) times daily as needed for anxiety.    Yes [provider]  diphenhydrAMINE (BENADRYL) 25 mg capsule Take 25 mg by mouth at bedtime as needed for allergies or sleep.   Yes [provider]  FLUoxetine (PROZAC) 40 MG capsule Take 40 mg by mouth daily.    Yes [provider]  nitroGLYCERIN (NITROSTAT) 0.4 MG SL tablet Place 1 tablet (0.4 mg total) under the tongue every 5 (five) minutes as needed for chest pain. 07/28/17  Yes Nat Christen, MD  SUMAtriptan (IMITREX) 50 MG tablet Take 50 mg by mouth every 2 (two) hours as needed for migraine. May  repeat in 2 hours if headache persists or recurs.   Yes [provider]    Allergies  Allergen Reactions  . Morphine And Related Itching and Other (See Comments)    Altered mental status "mean"  . Iodine Swelling and Rash  . Shellfish Allergy Swelling and Rash     Review of Systems:              General:                      normal appetite, decreased energy, no weight gain, no weight loss, no fever             Cardiac:                       +  chest pain with exertion, + chest pain at rest, + SOB with exertion, no resting SOB, no PND, no orthopnea, no palpitations, no arrhythmia, no atrial fibrillation, no LE edema, no dizzy spells, no syncope             Respiratory:                 no shortness of breath, no home oxygen, no productive cough, no dry cough, no bronchitis, no wheezing, no hemoptysis, no asthma, no pain with inspiration or cough, no sleep apnea, no CPAP at night             GI:                               no difficulty swallowing, no reflux, no frequent heartburn, no hiatal hernia, no abdominal pain, no constipation, no diarrhea, no hematochezia, no hematemesis, no melena             GU:                              no dysuria,  no frequency, no urinary tract infection, no hematuria, no kidney stones, no kidney disease             Vascular:                     no pain suggestive of claudication, no pain in feet, no leg cramps, no varicose veins, no DVT, no non-healing foot ulcer             Neuro:                         no stroke, no TIA's, no seizures, + migraine headaches, no temporary blindness one eye,  no slurred speech, no peripheral neuropathy but some numbness in both hands, no chronic pain, no instability of gait, no memory/cognitive dysfunction             Musculoskeletal:         no arthritis, no joint swelling, no myalgias, no difficulty walking, no mobility              Skin:                            no rash, no itching, no skin infections, no  pressure sores or ulcerations             Psych:                         no anxiety, no depression, no nervousness, no unusual recent stress             Eyes:                           no blurry vision, no floaters, no recent vision changes, + wears glasses or contacts             ENT:                            no hearing loss, no loose or painful teeth, no dentures  Hematologic:               no easy bruising, no abnormal bleeding, no clotting disorder, no frequent epistaxis             Endocrine:                   no diabetes, does not check CBG's at home                                                       Physical Exam:              BP 124/72   Pulse 72   Resp 20   Ht 5\' 6"  (1.676 m)   Wt 166 lb (75.3 kg)   SpO2 96% Comment: RA  BMI 26.79 kg/m              General:                        well-appearing             HEENT:                       Unremarkable              Neck:                           no JVD, no bruits, no adenopathy              Chest:                          clear to auscultation, symmetrical breath sounds, no wheezes, no rhonchi              CV:                              RRR, grade III/VI holosystolic murmur heard best at LUSB,  no diastolic murmur             Abdomen:                    soft, non-tender, no masses              Extremities:                 warm, well-perfused, pulses diminished, no LE edema             Rectal/GU                   Deferred             Neuro:                         Grossly non-focal and symmetrical throughout             Skin:                            Clean and dry, no rashes, no breakdown   Diagnostic Tests:  Transthoracic Echocardiography  Patient: Tirsa, Gail  MR #: [id number] Study Date: 08/29/2016 Gender: F Age: 60 Height: 167.6 cm Weight: 73 kg BSA: 1.86 m^2 Pt. Status: Room:  SONOGRAPHER [facility] ATTENDING Kate Sable,  MD [place], MD [place], MD PERFORMING Rayford Halsted  cc:  ------------------------------------------------------------------- LV EF: 60% - 65%  ------------------------------------------------------------------- Indications: Chest pain 786.51.  ------------------------------------------------------------------- History: PMH: Former Smoker, GERD, Retrosternal chest pain, Emotional stress. Murmur.  ------------------------------------------------------------------- Study Conclusions  - Left ventricle: The cavity size was normal. Wall thickness was normal. Systolic function was normal. The estimated ejection fraction was in the range of 60% to 65%. Wall motion was normal; there were no regional wall motion abnormalities. Left ventricular diastolic function parameters were normal. - Aortic valve: Mildly calcified annulus. Trileaflet; normal thickness leaflets. Valve area (VTI): 2.24 cm^2. Valve area (Vmax): 2.47 cm^2. - Left atrium: The atrium was moderately dilated. - Right ventricle: The ventricular septum is flattened in systole suggesting RV pressure overload. The cavity size was moderately dilated. Systolic function was normal. TAPSE: 28.1 mm . - Right atrium: The atrium was mildly dilated.  ------------------------------------------------------------------- Study data: Comparison was made to the study of 12/04/2014. Study status: Routine. Procedure: Transthoracic echocardiography. Image quality was adequate. Transthoracic echocardiography. M-mode, complete 2D, spectral Doppler, and color Doppler. Birthdate: Patient birthdate: 11-Oct-1960. Age: Patient is 57 yr old. Sex: Gender: female. BMI: 26 kg/m^2. Patient status: Outpatient. Study date: Study date: 08/29/2016. Study time: 11:01 AM. Location: Echo  laboratory.  -------------------------------------------------------------------  ------------------------------------------------------------------- Left ventricle: The cavity size was normal. Wall thickness was normal. Systolic function was normal. The estimated ejection fraction was in the range of 60% to 65%. Wall motion was normal; there were no regional wall motion abnormalities. Left ventricular diastolic function parameters were normal.  ------------------------------------------------------------------- Aortic valve: Mildly calcified annulus. Trileaflet; normal thickness leaflets. Doppler: VTI ratio of LVOT to aortic valve: 0.79. Valve area (VTI): 2.24 cm^2. Indexed valve area (VTI): 1.21 cm^2/m^2. Peak velocity ratio of LVOT to aortic valve: 0.87. Valve area (Vmax): 2.47 cm^2. Indexed valve area (Vmax): 1.33 cm^2/m^2. Mean gradient (S): 3 mm Hg. Peak gradient (S): 6 mm Hg.  ------------------------------------------------------------------- Aorta: Aortic root: The aortic root was normal in size.  ------------------------------------------------------------------- Mitral valve: Normal thickness leaflets . Doppler: There was no evidence for stenosis. There was no significant regurgitation. Peak gradient (D): 3 mm Hg.  ------------------------------------------------------------------- Left atrium: The atrium was moderately dilated.  ------------------------------------------------------------------- Right ventricle: The ventricular septum is flattened in systole suggesting RV pressure overload. The cavity size was moderately dilated. Systolic function was normal.  ------------------------------------------------------------------- Pulmonic valve: Not well visualized. Doppler: There was no evidence for stenosis. There was no significant regurgitation.  ------------------------------------------------------------------- Tricuspid  valve: Normal thickness leaflets. Doppler: There was no evidence for stenosis. There was mild regurgitation.  ------------------------------------------------------------------- Right atrium: The atrium was mildly dilated.  ------------------------------------------------------------------- Pericardium: There is a trivial circumferential pericardial effusion.  ------------------------------------------------------------------- Systemic veins: Inferior vena cava: The vessel was normal in size. The respirophasic diameter changes were in the normal range (>= 50%), consistent with normal central venous pressure.  ------------------------------------------------------------------- Measurements  Left ventricle Value Reference LV ID, ED, PLAX chordal (L) 34.7 mm 43 - 52 LV ID, ES, PLAX chordal 25.5 mm 23 - 38 LV fx shortening, PLAX chordal (L) 27 % >=29 LV PW thickness, ED 9 mm ---------- IVS/LV PW ratio, ED 1.09 <=1.3 LV ejection fraction, 1-p A4C 58 % ---------- LV end-diastolic volume, 2-p 58 ml ---------- LV end-systolic volume, 2-p 26 ml ---------- LV ejection fraction, 2-p 56 % ----------  Stroke volume, 2-p 32 ml ---------- LV end-diastolic volume/bsa, 31 ml/m^2 ---------- 2-p LV end-systolic volume/bsa, 2-p 14 ml/m^2 ---------- Stroke volume/bsa, 2-p 17.4 ml/m^2 ---------- LV e&', lateral 11.7 cm/s ---------- LV E/e&', lateral 7.01 ---------- LV e&', medial 6.96 cm/s ---------- LV E/e&', medial 11.78  ---------- LV e&', average 9.33 cm/s ---------- LV E/e&', average 8.79 ---------- LV ejection time 350 ms ----------  Ventricular septum Value Reference IVS thickness, ED 9.85 mm ----------  LVOT Value Reference LVOT ID, S 19 mm ---------- LVOT area 2.84 cm^2 ---------- LVOT peak velocity, S 106.56 cm/s ---------- LVOT VTI, S 20.75 cm ---------- Stroke volume (SV), LVOT DP 58.8 ml ---------- Stroke index (SV/bsa), LVOT DP 31.7 ml/m^2 ----------  Aortic valve Value Reference Aortic valve peak velocity, S 122.06 cm/s ---------- Aortic valve mean velocity, S 75.89 cm/s ---------- Aortic valve VTI, S 26.19 cm ---------- Aortic mean gradient, S 3 mm Hg ---------- Aortic peak gradient, S 6 mm Hg ---------- VTI ratio, LVOT/AV 0.79 ---------- Aortic valve area, VTI 2.24 cm^2 ---------- Aortic valve area/bsa, VTI 1.21 cm^2/m^2 ---------- Velocity ratio, peak, LVOT/AV 0.87 ---------- Aortic valve area, peak 2.47 cm^2 ---------- velocity Aortic valve area/bsa, peak 1.33 cm^2/m^2 ---------- velocity  Aorta Value Reference Aortic root ID, ED 26 mm ----------  Left atrium Value Reference LA ID, A-P, ES  37 mm ---------- LA volume/bsa, S 34.2 ml/m^2 ----------  Mitral valve Value Reference Mitral E-wave peak velocity 82 cm/s ---------- Mitral A-wave peak velocity 43.7 cm/s ---------- Mitral deceleration time (H) 250 ms 150 - 230 Mitral peak gradient, D 3 mm Hg ---------- Mitral E/A ratio, peak 1.9 ----------  Right atrium Value Reference RA ID, S-I, ES, A4C (H) 50.2 mm 34 - 49 RA area, ES, A4C 18.6 cm^2 8.3 - 19.5 RA volume, ES, A/L 57.3 ml ---------- RA volume/bsa, ES, A/L 30.9 ml/m^2 ----------  Systemic veins Value Reference Estimated CVP 3 mm Hg ----------  Right ventricle Value Reference TAPSE 28.1 mm ---------- RV pressure, S, DP (H) 31 mm Hg <=30 RV s&', lateral, S 13.1 cm/s ----------  Legend: (L) and (H) mark values outside specified reference range.  ------------------------------------------------------------------- Prepared and Electronically Authenticated by  Kerry Hough, M.D. 2018-08-10T12:35:03    RIGHT/LEFT HEART CATH AND CORONARY ANGIOGRAPHY  Conclusion    Normal coronary arteries.  Normal LV size and function.  Normal pulmonary artery pressures.  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   Qp/Qs greater than 2.7:1 (using IVC  O2 sat as mixed venous in calculation).  RECOMMENDATIONS:   Needs to have morphology identified to determine pulmonary vein drainage, and exclude ASD.  Discussed morphologic evaluation with Dr. Ena Dawley and the next step should be cardiac CT Angio.   No indication for antiplatelet therapy at this time.  Indications   Right ventricular enlargement [I51.7 (ICD-10-CM)]  Dyspnea on exertion [R06.09 (ICD-10-CM)]  Chest pain at rest [R07.9 (ICD-10-CM)]  Procedural Details/Technique   Technical Details The right radial area was sterilely prepped and draped. Intravenous sedation with Versed and fentanyl was administered. 1% Xylocaine was infiltrated to achieve local analgesia. Using real-time vascular ultrasound, a double wall stick with an angiocath was utilized to obtain intra-arterial access. A VUS image was saved for the permanent record.The modified Seldinger technique was used to place a 9F " Slender" sheath in the right radial artery. Weight based heparin was administered. Coronary angiography was  done using 5 F catheters. Right coronary angiography was performed with a JR4. Left ventricular hemodymic recordings and angiography was done using the JR 4 catheter and hand injection. Left coronary angiography was performed with a JL 3.5 cm.  Right heart catheterization was performed by exchanging a previously placed antecubital IV angio-cath for a 5 French Slender sheath. 1% Xylocaine was used to locally nesthetize the area around the IV site. The IV catheter was wired using an .018 guidewire. The modified Seldinger technique was used to place the 5 [place] sheath. Double glove technique was used to enhance sterility. After sheath insertion, right heart cath was performed using a 5 French balloon tipped catheter and fluoroscopic guidance. Pressures were recorded in each chamber and in the pulmonary capillary wedge position.. The main pulmonary artery O2 saturation was sampled.   Hemostasis  was achieved using a pneumatic band.  During this procedure the patient is administered a total of Versed 1 mg and Fentanyl 50 mg to achieve and maintain moderate conscious sedation. The patient's heart rate, blood pressure, and oxygen saturation are monitored continuously during the procedure. The period of conscious sedation is 46 minutes, of which I was present face-to-face 100% of this time.   Estimated blood loss <50 mL.  During this procedure the patient was administered the following to achieve and maintain moderate conscious sedation: Versed 1 mg, Fentanyl 50 mcg, while the patient's heart rate, blood pressure, and oxygen saturation were continuously monitored. The period of conscious sedation was 46 minutes, of which I was present face-to-face 100% of this time.  Coronary Findings   Diagnostic  Dominance: Right  Left Circumflex  First Obtuse Marginal Branch  Vessel is large in size.  Second Obtuse Marginal Branch  Vessel is small in size.  Third Obtuse Marginal Branch  Vessel is small in size.  Intervention   No interventions have been documented.  Right Heart   Right Heart Pressures Hemodynamic findings consistent with pulmonary hypertension. LV EDP is normal.  Right Ventricle The right ventricle is dilated.  Wall Motion        Resting               Left Heart   Left Ventricle The left ventricular size is normal. The left ventricular systolic function is normal. LV end diastolic pressure is normal. The left ventricular ejection fraction is greater than 65% by visual estimate. No regional wall motion abnormalities.  Coronary Diagrams   Diagnostic Diagram       Implants       No implant documentation for this case.  MERGE Images   Show images for CARDIAC CATHETERIZATION   Link to Procedure Log   Procedure Log    Hemo Data    Most Recent Value  Fick Cardiac Output 42.08 L/min  Fick Cardiac Output Index 22.81 (L/min)/BSA  RA A Wave 14  mmHg  RA V Wave 13 mmHg  RA Mean 11 mmHg  RV Systolic Pressure 36 mmHg  RV Diastolic Pressure 5 mmHg  RV EDP 14 mmHg  PA Systolic Pressure 31 mmHg  PA Diastolic Pressure 14 mmHg  PA Mean 22 mmHg  PW A Wave 19 mmHg  PW V Wave 25 mmHg  PW Mean 17 mmHg  AO Systolic Pressure 712 mmHg  AO Diastolic Pressure 79 mmHg  AO Mean 458 mmHg  LV Systolic Pressure 099 mmHg  LV Diastolic Pressure 11 mmHg  LV EDP 19 mmHg  AOp Systolic Pressure 833 mmHg  AOp Diastolic Pressure 77  mmHg  AOp Mean Pressure 440 mmHg  LVp Systolic Pressure 102 mmHg  LVp Diastolic Pressure 11 mmHg  LVp EDP Pressure 18 mmHg  TSVR Index 4.47 HRUI    Cardiac CTA  MEDICATIONS: Sub lingual nitro. 4mg  x 2 and lopressor 5mg  IV  TECHNIQUE: The patient was scanned on a Siemens 725 slice scanner. Gantry rotation speed was 250 msecs. Collimation was 0.8 mm. A 100 kV prospective scan was triggered in the ascending thoracic aorta at 35-75% of the R-R interval. Average HR during the scan was 60 bpm. The 3D data set was interpreted on a dedicated work station using MPR, MIP and VRT modes. A total of 80cc of contrast was used.  FINDINGS: Non-cardiac: See separate report from [facility].  Calcium Score: 0 Agatston units.  Coronary Arteries: Right dominant with no anomalies  LM: No plaque or stenosis.  LAD system: No plaque or stenosis.  Circumflex system: Relatively small vessel, distal LCx is quite small and not well visualized. However, no plaque or stenosis seen.  RCA system: The field of view used for this study was too small and a small segment of the distal RCA was cut out of the study. Besides this small segment, there is no plaque or stenosis.  IMPRESSION: 1. Coronary artery calcium score 0 Agatston units, suggesting low risk for future cardiac events.  2. No plaque or stenosis noted in the coronary arteries. One small section of the distal RCA was not visualized as the field of  view for the study was too small.  Dalton Mclean   Electronically Signed By: Loralie Champagne M.D. 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  I personally reviewed the cardiac gated CT angiogram of the heart and CT angiogram of the chest, abdomen, and pelvis performed earlier today.  Official results of these tests are not currently available.  The CT angiogram clearly demonstrates the presence of a sinus venosus type atrial septal defect.  There appears to be the usual arrangement of the pulmonary venous drainage with drainage of the right superior pulmonary vein directly into the superior vena cava just above the junction between the superior vena cava and the right atrium.  The right inferior pulmonary vein appears to drain into the left atrium as do both left-sided veins.  No other significant abnormalities are noted and there do not appear to be any contraindications to peripheral cannulation for surgery.    Plan:  I have again reviewed the indications, risk, potential benefits of closure of sinus venosus type atrial septal defect with correction of partial anomalous pulmonary venous return the patient in the office today.  Expectations for the patient's postoperative convalescence have been discussed.  Risks specifically related to surgical correction including the possibility of sinus node dysfunction, need for permanent pacemaker placement, and irregular heart rhythms were emphasized. The patient understands and accepts allpotentialpotential associated risks of surgery including but not limited to risk of death, stroke, myocardial infarction, congestive heart failure, respiratory failure, renal failure, bleeding requiring blood transfusion and/or reexploration, arrhythmia, heart block or bradycardia requiring permanent pacemaker, pneumonia, pleural effusion, wound  infection, pulmonary embolus or other thromboembolic complication, chronic pain or other delayed complications.Alternative surgical approaches have been discussed including a comparison between conventional sternotomy and minimally-invasive techniques. The relative risks and benefits of each have been reviewed as they pertain to the patient's specific circumstances, and all of their questions have  been addressed. Specific risks potentially related to the minimally-invasive approach were discussed at length, including but not limited to risk of conversion to full or partial sternotomy, aortic dissection or other major vascular complication, unilateral acute lung injury or pulmonary edema, phrenic nerve dysfunction or paralysis, rib fracture, chronic pain, lung hernia, or lymphocele. All questions answered.  For surgery on Wednesday.  We will specifically request that the anesthesia team use central venous access placed on the left side without placement of Swan-Ganz pulmonary artery catheter.     Valentina Gu. [name], MD 10/19/2017 1:27 PM   Addendum:  Cardiac Gated CTA  TECHNIQUE: The patient was scanned on a Siemens Force 245 slice scanner. Gantry rotation speed was 250 msec with a temporal resolution of 66 msec. A prospective scan was triggered in the ascending thoracic aorta at 140 HU's Data sets were reconstructed with full mA between 35% and 75% of the R-R interval Images were reviewed using VRT, MIP and MPR modes. Double oblique images were used to measure the PV diameter and areas. The patient received 80 cc of contrast at 5 cc/sec  CONTRAST:  Isovue 370 total 80 cc  COMPARISON:  Cardiac CT done 08/26/17  FINDINGS: The right sided cardiac chambers were severely dilated. 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 The left sided PV's were normal. There was no LAA thrombus  LUPV:  Ostium 17.8 mm   area 3.0 cm2  LLPV:   Ostium 17.8 mm  area 1.4 cm2 Elliptical  RUPV:  Ostium 18 mm  area 2.3 cm2  anomalous draining into the SVC  RLPV:  Ostium 17.7 mm  area 2.4 cm2  RMPV: Ostium 16.1 mm area 1.94 cm2  IMPRESSION: 1.  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  Anomalous RSPV draining into the SVC  3.  Severe RA/RV enlargement  4. Normal right dominant coronary arteries with no calcium seen better on CT 08/26/17   Electronically Signed   By: Jenkins Rouge M.D.   On: 10/19/2017 13:45

## 2017-10-19 NOTE — Anesthesia Preprocedure Evaluation (Addendum)
Anesthesia Evaluation  Patient identified by MRN, date of birth, ID band Patient awake    Reviewed: Allergy & Precautions, NPO status , Patient's Chart, lab work & pertinent test results  History of Anesthesia Complications Negative for: history of anesthetic complications  Airway Mallampati: III  TM Distance: <3 FB Neck ROM: Full    Dental  (+) Teeth Intact,    Pulmonary shortness of breath, former smoker,    breath sounds clear to auscultation       Cardiovascular + Valvular Problems/Murmurs  Rhythm:Regular     Neuro/Psych  Headaches, PSYCHIATRIC DISORDERS Depression Bipolar Disorder  Neuromuscular disease    GI/Hepatic Neg liver ROS, GERD  Medicated and Controlled,  Endo/Other  negative endocrine ROS  Renal/GU negative Renal ROS     Musculoskeletal negative musculoskeletal ROS (+)   Abdominal   Peds  Hematology negative hematology ROS (+)   Anesthesia Other Findings 7/19 cath:   Normal coronary arteries.  Normal LV size and function.  Normal pulmonary artery pressures.  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    Qp/Qs greater than 2.7:1 (using IVC O2 sat as mixed venous in calculation).  Reproductive/Obstetrics                            Anesthesia Physical Anesthesia Plan  ASA: II  Anesthesia Plan: General   Post-op Pain Management:    Induction: Intravenous  PONV Risk Score and Plan: 3 and Ondansetron and Dexamethasone  Airway Management Planned: Double Lumen EBT  Additional Equipment: Arterial line, CVP, TEE and Ultrasound Guidance Line Placement  Intra-op Plan:   Post-operative Plan: Possible Post-op intubation/ventilation  Informed Consent: I have reviewed the patients History and Physical, chart, labs and  discussed the procedure including the risks, benefits and alternatives for the proposed anesthesia with the patient or authorized representative who has indicated his/her understanding and acceptance.   Dental advisory given  Plan Discussed with: CRNA  Anesthesia Plan Comments: (Per TCTS RN Ryan: Dr. [name] wants this patient on Wednesday 10/2 NOT to have any Towana Badger at all and Central line to be placed in the left only. Myra Gianotti, PA-C)       Anesthesia Quick Evaluation

## 2017-10-19 NOTE — Progress Notes (Addendum)
Anesthesia Chart Review:  Case:  [id number] Date/Time:  10/21/17 0815   Procedures:      MINIMALLY INVASIVE REPAIR OF SINUS VENOSUS ATRIAL SEPTAL DEFECT (ASD) (N/A Chest)     TRANSESOPHAGEAL ECHOCARDIOGRAM (TEE) (N/A )   Anesthesia type:  General   Pre-op diagnosis:  SINUS VENOSUS ATRIAL SEPTAL DEFECT   Location:  [facility] ROOM 15 / [facility]   Surgeon:  Rexene Alberts, MD      DISCUSSION: Kristy Lewis is a 57 year old female scheduled for the above procedure.   History includes former smoker (quit 08/05/17), murmur/ASD, exertional dyspnea, hypercholesterolemia, dysphagia, migraines. She reported chronic chest pain for years.    She had additional imaging done today including CT coronary as difficult to tell if there might be multiple veins on previous testing. If complete review of the anatomy is not feasible on this exam then would consider cardiac MRI. Dr. [name] reviewed results today (see his 10/19/17 note). She is scheduled to proceed as planned. Per Dr. [name]: "We will specifically request that the anesthesia team use central venous access placed on the left side without placement of Swan-Ganz pulmonary artery catheter." (This was added to anesthesia plan.)   VS: BP 134/86   Pulse 60   Temp 36.6 C   Resp 20   Ht 5\' 6"  (1.676 m)   Wt 75.7 kg   SpO2 100%   BMI 26.92 kg/m   PROVIDERS: Monico Blitz, MD is PCP Kate Sable, MD is cardiologist   LABS: Labs reviewed: Acceptable for surgery. (all labs ordered are listed, but only abnormal results are displayed)  Labs Reviewed  BLOOD GAS, ARTERIAL - Abnormal; Notable for the following components:      Result Value   pH, Arterial 7.455 (*)    pO2, Arterial 120 (*)    All other components within normal limits  CBC - Abnormal; Notable for the following components:   MCV 102.4 (*)    All other components within normal limits  URINALYSIS, ROUTINE W REFLEX MICROSCOPIC - Abnormal; Notable for the following components:   Color, Urine STRAW  (*)    Hgb urine dipstick SMALL (*)    Bacteria, UA RARE (*)    All other components within normal limits  SURGICAL PCR SCREEN  APTT  COMPREHENSIVE METABOLIC PANEL  HEMOGLOBIN A1C  PROTIME-INR  TYPE AND SCREEN  ABO/RH   PFTs 10/08/16: FVC 2.98 (81%), FEV1 2.48 (86%), DLCO unc 21.39 (79%).   IMAGES: CTA chest/abd/pelvis 10/19/17. IMPRESSION: - Cardiovascular: Four-chamber cardiac enlargement. Small pericardial effusion. Dilated central pulmonary arteries. Satisfactory opacification of pulmonary arteries noted, and there is no evidence of pulmonary emboli. Large ASD and anomalous superior right pulmonary vein draining into the SVC as previously demonstrated; see concurrent cardiac CTA interpreted separately. Adequate contrast opacification of the thoracic aorta with no evidence of dissection, aneurysm, or stenosis. There is classic 3-vessel brachiocephalic arch anatomy without proximal stenosis. - Aortoiliac atherosclerosis (ICD10-170.0) without dissection or aneurysm.  CXR 10/19/17:  IMPRESSION: 1. Mild linear atelectasis within the right mid and left lower lung. 2. Mild cardiomegaly without edema.   EKG: 10/19/17: SB at 56 bpm.    CV: CT coronary (gated) 10/19/17:  IMPRESSION: 1.  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  Anomalous RSPV draining into the SVC 3.  Severe RA/RV enlargement 4. Normal right dominant coronary arteries with no calcium seen better on CT 08/26/17  CT coronary 08/26/17: IMPRESSION: 1. Coronary artery calcium score 0 Agatston units, suggesting low risk  for future cardiac events. 2. No plaque or stenosis noted in the coronary arteries. One small section of the distal RCA was not visualized as the field of view for the study was too small. ADDENDUM: The images were reviewed with Dr [name]. There is a connection between right atrium and left atrium at the supero-posterior portion of the interatrial septum measuring 15 x 16 mm. Right upper  pulmonary vein drains anomalously into the SVC 13 mm above the sinoatrial junction. Right lower pulmonary vein drains into the left atrium adjacent to the ASD. Left sided pulmonary veins have normal drainage into the left atrium. 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 Field of view (FOV) not large enough to evaluate which lung lobes drain into the right upper pulmonary vein. Consider gated chest CTA for further evaluation.  Cardiac cath 08/05/17:  Normal coronary arteries.  Normal LV size and function.  Normal pulmonary artery pressures.  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    Qp/Qs greater than 2.7:1 (using IVC O2 sat as mixed venous in calculation). RECOMMENDATIONS:  Needs to have morphology identified to determine pulmonary vein drainage, and exclude ASD.  Discussed morphologic evaluation with Dr. Ena Dawley and the next step should be cardiac CT Angio.   Echo 08/29/16: Study Conclusions - Left ventricle: The cavity size was normal. Wall thickness was   normal. Systolic function was normal. The estimated ejection   fraction was in the range of 60% to 65%. Wall motion was normal;   there were no regional wall motion abnormalities. Left   ventricular diastolic function parameters were normal. - Aortic valve: Mildly calcified annulus. Trileaflet; normal   thickness leaflets. Valve area (VTI): 2.24 cm^2. Valve area   (Vmax): 2.47 cm^2. - Left atrium: The atrium was moderately dilated. - Right ventricle: The ventricular septum is flattened in systole   suggesting RV pressure overload. The cavity size was moderately   dilated. Systolic function was normal. TAPSE: 28.1 mm . - Right atrium: The atrium was mildly dilated.  Carotid U/S 10/19/17  (Preliminary): 1-39% ICA stenosis bilaterally.   Past Medical History:  Diagnosis Date  . Anginal pain ([place])    Comes and goes, pt states its normal to have every day   . ASD (atrial septal defect), sinus venosus defect   . Bipolar disorder ([place])    pt denies  . Cardiac murmur   . Carpal tunnel syndrome   . Depressive disorder   . Dysphagia   . Dyspnea    Upon exertion  . Esophageal reflux   . Genital herpes    at age 36  . Hypercholesteremia   . Migraines   . Urinary incontinence     Past Surgical History:  Procedure Laterality Date  . ABDOMINAL HYSTERECTOMY     with bladder surgery. Ovaries remained  . COLONOSCOPY  10/15/2011   Procedure: COLONOSCOPY;  Surgeon: Rogene Houston, MD;  Location: [facility];  Service: Endoscopy;  Laterality: N/A;  200  . HEMORRHOID SURGERY    . left shoulder manipulation    . RIGHT/LEFT HEART CATH AND CORONARY ANGIOGRAPHY N/A 08/05/2017   Procedure: RIGHT/LEFT HEART CATH AND CORONARY ANGIOGRAPHY;  Surgeon: Belva Crome, MD;  Location: [facility] LAB;  Service: Cardiovascular;  Laterality: N/A;  . TONSILLECTOMY      MEDICATIONS: .  nitroGLYCERIN (NITROSTAT) 0.4 MG SL tablet  . Ascorbic Acid (VITAMIN C PO)  . diazepam (VALIUM) 2 MG tablet  . diphenhydrAMINE (BENADRYL) 25 mg capsule  . FLUoxetine (PROZAC) 40 MG capsule  . SUMAtriptan (IMITREX) 50 MG tablet   No current facility-administered medications for this encounter.     George Hugh [facility] Short Stay Center/Anesthesiology Phone [phone] 10/20/2017 1:54 PM

## 2017-10-19 NOTE — Progress Notes (Signed)
Pre-ASD repair testing has been completed. 1-39% ICA stenosis bilaterally.  Palmar Waveforms Right Palmar waveforms are obliterated with radial and ulnar compression. Left Palmar waveforms are obliterated with radial and are diminished greater than fifty percent with ulnar compression.  10/19/17 9:48 AM Kristy Lewis RVT

## 2017-10-19 NOTE — Pre-Procedure Instructions (Signed)
Infant Doane [place]  10/19/2017      [facility] - [place], [place], [place] [address] [place] [address] Phone: [phone] Fax: [phone]    Your procedure is scheduled on Oct. 2  Report to [facility] at 6:30 A.M.  Call this number if you have problems the morning of surgery:  [phone]   Remember:  Do not eat or drink after midnight.      Take these medicines the morning of surgery with A SIP OF WATER :               Diazepam (valium)              Fluoxetine (prozac)              Nitroglycerine if needed              Sumatriptan (imitrex)                  Do not wear jewelry, make-up or nail polish.  Do not wear lotions, powders, or perfumes, or deodorant.  Do not shave 48 hours prior to surgery.  Men may shave face and neck.  Do not bring valuables to the hospital.  [facility] is not responsible for any belongings or valuables.  Contacts, dentures or bridgework may not be worn into surgery.  Leave your suitcase in the car.  After surgery it may be brought to your room.  For patients admitted to the hospital, discharge time will be determined by your treatment team.  Patients discharged the day of surgery will not be allowed to drive home.    Special instructions:  [facility]- Preparing For Surgery  Before surgery, you can play an important role. Because skin is not sterile, your skin needs to be as free of germs as possible. You can reduce the number of germs on your skin by washing with CHG (chlorahexidine gluconate) Soap before surgery.  CHG is an antiseptic cleaner which kills germs and bonds with the skin to continue killing germs even after washing.    Oral Hygiene is also important to reduce your risk of infection.  Remember - BRUSH YOUR TEETH THE MORNING OF SURGERY WITH YOUR REGULAR TOOTHPASTE  Please do not use if you have an allergy to CHG or antibacterial soaps. If your skin becomes  reddened/irritated stop using the CHG.  Do not shave (including legs and underarms) for at least 48 hours prior to first CHG shower. It is OK to shave your face.  Please follow these instructions carefully.   1. Shower the NIGHT BEFORE SURGERY and the MORNING OF SURGERY with CHG.   2. If you chose to wash your hair, wash your hair first as usual with your normal shampoo.  3. After you shampoo, rinse your hair and body thoroughly to remove the shampoo.  4. Use CHG as you would any other liquid soap. You can apply CHG directly to the skin and wash gently with a scrungie or a clean washcloth.   5. Apply the CHG Soap to your body ONLY FROM THE NECK DOWN.  Do not use on open wounds or open sores. Avoid contact with your eyes, ears, mouth and genitals (private parts). Wash Face and genitals (private parts)  with your normal soap.  6. Wash thoroughly, paying special attention to the area where your surgery will be performed.  7. Thoroughly rinse your body with  warm water from the neck down.  8. DO NOT shower/wash with your normal soap after using and rinsing off the CHG Soap.  9. Pat yourself dry with a CLEAN TOWEL.  10. Wear CLEAN PAJAMAS to bed the night before surgery, wear comfortable clothes the morning of surgery  11. Place CLEAN SHEETS on your bed the night of your first shower and DO NOT SLEEP WITH PETS.    Day of Surgery:  Do not apply any deodorants/lotions.  Please wear clean clothes to the hospital/surgery center.   Remember to brush your teeth WITH YOUR REGULAR TOOTHPASTE.    Please read over the following fact sheets that you were given. Coughing and Deep Breathing, MRSA Information and Surgical Site Infection Prevention

## 2017-10-19 NOTE — Patient Instructions (Signed)
   Continue taking all current medications without change through the day before surgery.  Have nothing to eat or drink after midnight the night before surgery.  On the morning of surgery do not take any medications   

## 2017-10-19 NOTE — Progress Notes (Signed)
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  She was originally seen in consultation on October 01, 2017.  She underwent cardiac gated CT angiogram of the heart and CT angiogram of the aortoiliac vessels earlier today at the time of her routine preoperative visit.  She reports no new problems or complaints since her original office consultation.  She states that she has cut back on her smoking but she still continues to smoke and occasional cigarette.   Current Outpatient Medications  Medication Sig Dispense Refill  . Ascorbic Acid (VITAMIN C PO) Take 1 tablet by mouth daily.    . diazepam (VALIUM) 2 MG tablet Take 2 mg by mouth 2 (two) times daily as needed for anxiety.     . diphenhydrAMINE (BENADRYL) 25 mg capsule Take 25 mg by mouth at bedtime as needed for allergies or sleep.    Marland Kitchen FLUoxetine (PROZAC) 40 MG capsule Take 40 mg by mouth daily.     . nitroGLYCERIN (NITROSTAT) 0.4 MG SL tablet Place 1 tablet (0.4 mg total) under the tongue every 5 (five) minutes as needed for chest pain. 30 tablet 0  . SUMAtriptan (IMITREX) 50 MG tablet Take 50 mg by mouth every 2 (two) hours as needed for migraine. May repeat in 2 hours if headache persists or recurs.     No current facility-administered medications for this visit.       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  I personally reviewed the cardiac gated CT angiogram of the heart and CT angiogram of the chest, abdomen, and pelvis performed earlier today.  Official results of these tests are not currently available.  The CT angiogram clearly demonstrates the presence of a sinus venosus type atrial septal defect.  There appears to be the usual arrangement of the pulmonary venous drainage with drainage of the right superior pulmonary vein directly into the superior vena cava just above the junction between the superior vena cava and the right atrium.  The right inferior pulmonary vein appears to drain into the left atrium as do both left-sided veins.  No other significant abnormalities are noted and there do not appear to be any contraindications to peripheral cannulation for surgery.    Plan:  I have again reviewed the indications, risk, potential benefits of closure of sinus venosus type atrial septal defect with correction of partial anomalous pulmonary venous return the patient in the office today.  Expectations  for the patient's postoperative convalescence have been discussed.   Risks specifically related to surgical correction including the possibility of sinus node dysfunction, need for permanent pacemaker placement, and irregular heart rhythms were emphasized.  The patient understands and accepts all potential potential associated risks of surgery including but not limited to risk of death, stroke, myocardial infarction, congestive heart failure, respiratory failure, renal failure, bleeding requiring blood transfusion and/or reexploration, arrhythmia, heart block or bradycardia requiring permanent pacemaker, pneumonia, pleural effusion, wound  infection, pulmonary embolus or other thromboembolic complication, chronic pain or other delayed complications.  Alternative surgical approaches have been discussed including a comparison between conventional sternotomy and minimally-invasive techniques.  The relative risks and benefits of each have been reviewed as they pertain to the patient's specific circumstances, and all of their questions have been addressed.  Specific risks potentially related to the minimally-invasive approach were discussed at length, including but not limited to risk of conversion to full or partial sternotomy, aortic dissection or other major vascular complication, unilateral acute lung injury or pulmonary edema, phrenic nerve dysfunction or paralysis, rib fracture, chronic pain, lung hernia, or lymphocele.   All questions answered.  For surgery on Wednesday.  We will specifically request that the anesthesia team use central venous access placed on the left side without placement of Swan-Ganz pulmonary artery catheter.  I spent in excess of 15 minutes during the conduct of this office consultation and >50% of this time involved direct face-to-face encounter with the patient for counseling and/or coordination of their care.   Valentina Gu. [name], MD 10/19/2017 1:27 PM

## 2017-10-19 NOTE — Progress Notes (Signed)
PCP: Monico Blitz  Cardiologist: Dr. Bronson Ing  DM: denies  Pt stated she had chest pain this morning, but that is a normal every day occurrence for her.  Cardiologist is aware of pt symptoms.  Kristy Lewis also notified, as well.   Pt denies SOB, cough, or fever.  PFT: Done in 2018.  Per Dr. [name], pt does not need to redo PFT before surgery on Oct. 2nd.   Pt states understanding of instructions for day of surgery.

## 2017-10-20 ENCOUNTER — Ambulatory Visit: Payer: BLUE CROSS/BLUE SHIELD | Admitting: Thoracic Surgery (Cardiothoracic Vascular Surgery)

## 2017-10-20 LAB — HEMOGLOBIN A1C
Hgb A1c MFr Bld: 5.4 % (ref 4.8–5.6)
MEAN PLASMA GLUCOSE: 108 mg/dL

## 2017-10-20 MED ORDER — DOPAMINE-DEXTROSE 3.2-5 MG/ML-% IV SOLN
0.0000 ug/kg/min | INTRAVENOUS | Status: DC
  Filled 2017-10-20: qty 250

## 2017-10-20 MED ORDER — TRANEXAMIC ACID 1000 MG/10ML IV SOLN
1.5000 mg/kg/h | INTRAVENOUS | Status: AC
  Administered 2017-10-21: 1.5 mg/kg/h via INTRAVENOUS
  Filled 2017-10-20: qty 25

## 2017-10-20 MED ORDER — POTASSIUM CHLORIDE 2 MEQ/ML IV SOLN
80.0000 meq | INTRAVENOUS | Status: DC
  Filled 2017-10-20: qty 40

## 2017-10-20 MED ORDER — TRANEXAMIC ACID (OHS) BOLUS VIA INFUSION
15.0000 mg/kg | INTRAVENOUS | Status: AC
  Administered 2017-10-21: 1129.5 mg via INTRAVENOUS
  Filled 2017-10-20: qty 1130

## 2017-10-20 MED ORDER — VANCOMYCIN HCL 10 G IV SOLR
1250.0000 mg | INTRAVENOUS | Status: AC
  Administered 2017-10-21: 1250 mg via INTRAVENOUS
  Filled 2017-10-20: qty 1250

## 2017-10-20 MED ORDER — DEXMEDETOMIDINE HCL IN NACL 400 MCG/100ML IV SOLN
0.1000 ug/kg/h | INTRAVENOUS | Status: DC
  Filled 2017-10-20: qty 100

## 2017-10-20 MED ORDER — KENNESTONE BLOOD CARDIOPLEGIA (KBC) MANNITOL SYRINGE (20%, 32ML)
32.0000 mL | Freq: Once | INTRAVENOUS | Status: DC
  Filled 2017-10-20: qty 32

## 2017-10-20 MED ORDER — VANCOMYCIN HCL 1000 MG IV SOLR
INTRAVENOUS | Status: AC
  Administered 2017-10-21: 1000 mL
  Filled 2017-10-20: qty 1000

## 2017-10-20 MED ORDER — MILRINONE LACTATE IN DEXTROSE 20-5 MG/100ML-% IV SOLN
0.3000 ug/kg/min | INTRAVENOUS | Status: DC
  Filled 2017-10-20: qty 100

## 2017-10-20 MED ORDER — TRANEXAMIC ACID (OHS) PUMP PRIME SOLUTION
2.0000 mg/kg | INTRAVENOUS | Status: DC
  Filled 2017-10-20: qty 1.51

## 2017-10-20 MED ORDER — PLASMA-LYTE 148 IV SOLN
INTRAVENOUS | Status: DC
  Filled 2017-10-20: qty 2.5

## 2017-10-20 MED ORDER — EPINEPHRINE PF 1 MG/ML IJ SOLN
0.0000 ug/min | INTRAVENOUS | Status: DC
  Filled 2017-10-20: qty 4

## 2017-10-20 MED ORDER — SODIUM CHLORIDE 0.9 % IV SOLN
INTRAVENOUS | Status: DC
  Filled 2017-10-20: qty 1

## 2017-10-20 MED ORDER — SODIUM CHLORIDE 0.9 % IV SOLN
INTRAVENOUS | Status: DC
  Filled 2017-10-20: qty 30

## 2017-10-20 MED ORDER — KENNESTONE BLOOD CARDIOPLEGIA VIAL
13.0000 mL | Freq: Once | Status: DC
  Filled 2017-10-20: qty 13

## 2017-10-20 MED ORDER — SODIUM CHLORIDE 0.9 % IV SOLN
750.0000 mg | INTRAVENOUS | Status: DC
  Filled 2017-10-20: qty 750

## 2017-10-20 MED ORDER — PHENYLEPHRINE HCL-NACL 20-0.9 MG/250ML-% IV SOLN
30.0000 ug/min | INTRAVENOUS | Status: DC
  Filled 2017-10-20: qty 250

## 2017-10-20 MED ORDER — NITROGLYCERIN IN D5W 200-5 MCG/ML-% IV SOLN
2.0000 ug/min | INTRAVENOUS | Status: AC
  Administered 2017-10-21: 5 ug/min via INTRAVENOUS
  Filled 2017-10-20: qty 250

## 2017-10-20 MED ORDER — MAGNESIUM SULFATE 50 % IJ SOLN
40.0000 meq | INTRAMUSCULAR | Status: DC
  Filled 2017-10-20: qty 9.85

## 2017-10-20 MED ORDER — SODIUM CHLORIDE 0.9 % IV SOLN
1.5000 g | INTRAVENOUS | Status: AC
  Administered 2017-10-21: 1.5 g via INTRAVENOUS
  Filled 2017-10-20: qty 1.5

## 2017-10-21 ENCOUNTER — Inpatient Hospital Stay (HOSPITAL_COMMUNITY): Payer: BLUE CROSS/BLUE SHIELD | Admitting: Vascular Surgery

## 2017-10-21 ENCOUNTER — Inpatient Hospital Stay (HOSPITAL_COMMUNITY): Payer: BLUE CROSS/BLUE SHIELD

## 2017-10-21 ENCOUNTER — Encounter (HOSPITAL_COMMUNITY): Payer: Self-pay | Admitting: *Deleted

## 2017-10-21 ENCOUNTER — Encounter (HOSPITAL_COMMUNITY)
Admission: RE | Disposition: A | Discharge status: Home / Self Care | Payer: Self-pay | Source: Ambulatory Visit | Attending: Thoracic Surgery (Cardiothoracic Vascular Surgery)

## 2017-10-21 ENCOUNTER — Other Ambulatory Visit: Payer: Self-pay

## 2017-10-21 ENCOUNTER — Inpatient Hospital Stay (HOSPITAL_COMMUNITY)
Admission: RE | Admit: 2017-10-21 | Discharge: 2017-10-26 | DRG: 229 | Disposition: A | Discharge status: Home / Self Care | Payer: BLUE CROSS/BLUE SHIELD | Source: Ambulatory Visit | Attending: Thoracic Surgery (Cardiothoracic Vascular Surgery) | Admitting: Thoracic Surgery (Cardiothoracic Vascular Surgery)

## 2017-10-21 DIAGNOSIS — Z87891 Personal history of nicotine dependence: Secondary | ICD-10-CM | POA: Diagnosis not present

## 2017-10-21 DIAGNOSIS — I440 Atrioventricular block, first degree: Secondary | ICD-10-CM | POA: Diagnosis present

## 2017-10-21 DIAGNOSIS — Q211 Atrial septal defect, unspecified: Secondary | ICD-10-CM

## 2017-10-21 DIAGNOSIS — F419 Anxiety disorder, unspecified: Secondary | ICD-10-CM | POA: Diagnosis present

## 2017-10-21 DIAGNOSIS — K219 Gastro-esophageal reflux disease without esophagitis: Secondary | ICD-10-CM | POA: Diagnosis present

## 2017-10-21 DIAGNOSIS — I510 Cardiac septal defect, acquired: Secondary | ICD-10-CM

## 2017-10-21 DIAGNOSIS — Q2116 Sinus venosus atrial septal defect, unspecified: Secondary | ICD-10-CM

## 2017-10-21 DIAGNOSIS — D6959 Other secondary thrombocytopenia: Secondary | ICD-10-CM | POA: Diagnosis present

## 2017-10-21 DIAGNOSIS — E7800 Pure hypercholesterolemia, unspecified: Secondary | ICD-10-CM | POA: Diagnosis present

## 2017-10-21 DIAGNOSIS — Z8249 Family history of ischemic heart disease and other diseases of the circulatory system: Secondary | ICD-10-CM

## 2017-10-21 DIAGNOSIS — Q263 Partial anomalous pulmonary venous connection: Secondary | ICD-10-CM | POA: Diagnosis not present

## 2017-10-21 DIAGNOSIS — Z91013 Allergy to seafood: Secondary | ICD-10-CM

## 2017-10-21 DIAGNOSIS — G43909 Migraine, unspecified, not intractable, without status migrainosus: Secondary | ICD-10-CM | POA: Diagnosis present

## 2017-10-21 DIAGNOSIS — Z90710 Acquired absence of both cervix and uterus: Secondary | ICD-10-CM

## 2017-10-21 DIAGNOSIS — I272 Pulmonary hypertension, unspecified: Secondary | ICD-10-CM | POA: Diagnosis present

## 2017-10-21 DIAGNOSIS — Z91048 Other nonmedicinal substance allergy status: Secondary | ICD-10-CM

## 2017-10-21 DIAGNOSIS — J9811 Atelectasis: Secondary | ICD-10-CM

## 2017-10-21 DIAGNOSIS — Z885 Allergy status to narcotic agent status: Secondary | ICD-10-CM

## 2017-10-21 DIAGNOSIS — E8770 Fluid overload, unspecified: Secondary | ICD-10-CM | POA: Diagnosis not present

## 2017-10-21 DIAGNOSIS — Z09 Encounter for follow-up examination after completed treatment for conditions other than malignant neoplasm: Secondary | ICD-10-CM

## 2017-10-21 DIAGNOSIS — Z8774 Personal history of (corrected) congenital malformations of heart and circulatory system: Secondary | ICD-10-CM

## 2017-10-21 DIAGNOSIS — J939 Pneumothorax, unspecified: Secondary | ICD-10-CM

## 2017-10-21 HISTORY — PX: ASD REPAIR: SHX258

## 2017-10-21 HISTORY — DX: Personal history of (corrected) congenital malformations of heart and circulatory system: Z87.74

## 2017-10-21 HISTORY — PX: TEE WITHOUT CARDIOVERSION: SHX5443

## 2017-10-21 LAB — POCT I-STAT, CHEM 8
BUN: 12 mg/dL (ref 6–20)
BUN: 11 mg/dL (ref 6–20)
BUN: 9 mg/dL (ref 6–20)
BUN: 10 mg/dL (ref 6–20)
BUN: 10 mg/dL (ref 6–20)
BUN: 9 mg/dL (ref 6–20)
CALCIUM ION: 1.19 mmol/L (ref 1.15–1.40)
CALCIUM ION: 1.01 mmol/L — AB (ref 1.15–1.40)
CALCIUM ION: 1.02 mmol/L — AB (ref 1.15–1.40)
CALCIUM ION: 1.04 mmol/L — AB (ref 1.15–1.40)
CHLORIDE: 107 mmol/L (ref 98–111)
CHLORIDE: 108 mmol/L (ref 98–111)
CHLORIDE: 106 mmol/L (ref 98–111)
CHLORIDE: 106 mmol/L (ref 98–111)
CREATININE: 0.6 mg/dL (ref 0.44–1.00)
CREATININE: 0.5 mg/dL (ref 0.44–1.00)
CREATININE: 0.5 mg/dL (ref 0.44–1.00)
CREATININE: 0.5 mg/dL (ref 0.44–1.00)
CREATININE: 0.6 mg/dL (ref 0.44–1.00)
Calcium, Ion: 1.24 mmol/L (ref 1.15–1.40)
Calcium, Ion: 1.11 mmol/L — ABNORMAL LOW (ref 1.15–1.40)
Chloride: 105 mmol/L (ref 98–111)
Chloride: 109 mmol/L (ref 98–111)
Creatinine, Ser: 0.6 mg/dL (ref 0.44–1.00)
GLUCOSE: 99 mg/dL (ref 70–99)
GLUCOSE: 116 mg/dL — AB (ref 70–99)
GLUCOSE: 140 mg/dL — AB (ref 70–99)
GLUCOSE: 126 mg/dL — AB (ref 70–99)
GLUCOSE: 162 mg/dL — AB (ref 70–99)
Glucose, Bld: 106 mg/dL — ABNORMAL HIGH (ref 70–99)
HCT: 36 % (ref 36.0–46.0)
HCT: 28 % — ABNORMAL LOW (ref 36.0–46.0)
HCT: 27 % — ABNORMAL LOW (ref 36.0–46.0)
HCT: 30 % — ABNORMAL LOW (ref 36.0–46.0)
HEMATOCRIT: 34 % — AB (ref 36.0–46.0)
HEMATOCRIT: 35 % — AB (ref 36.0–46.0)
HEMOGLOBIN: 9.2 g/dL — AB (ref 12.0–15.0)
HEMOGLOBIN: 11.9 g/dL — AB (ref 12.0–15.0)
Hemoglobin: 11.6 g/dL — ABNORMAL LOW (ref 12.0–15.0)
Hemoglobin: 12.2 g/dL (ref 12.0–15.0)
Hemoglobin: 9.5 g/dL — ABNORMAL LOW (ref 12.0–15.0)
Hemoglobin: 10.2 g/dL — ABNORMAL LOW (ref 12.0–15.0)
POTASSIUM: 3.7 mmol/L (ref 3.5–5.1)
POTASSIUM: 3.9 mmol/L (ref 3.5–5.1)
POTASSIUM: 4.1 mmol/L (ref 3.5–5.1)
Potassium: 3.7 mmol/L (ref 3.5–5.1)
Potassium: 4.3 mmol/L (ref 3.5–5.1)
Potassium: 4.2 mmol/L (ref 3.5–5.1)
SODIUM: 140 mmol/L (ref 135–145)
Sodium: 141 mmol/L (ref 135–145)
Sodium: 140 mmol/L (ref 135–145)
Sodium: 141 mmol/L (ref 135–145)
Sodium: 137 mmol/L (ref 135–145)
Sodium: 139 mmol/L (ref 135–145)
TCO2: 26 mmol/L (ref 22–32)
TCO2: 23 mmol/L (ref 22–32)
TCO2: 26 mmol/L (ref 22–32)
TCO2: 26 mmol/L (ref 22–32)
TCO2: 22 mmol/L (ref 22–32)
TCO2: 25 mmol/L (ref 22–32)

## 2017-10-21 LAB — POCT I-STAT 3, ART BLOOD GAS (G3+)
ACID-BASE DEFICIT: 4 mmol/L — AB (ref 0.0–2.0)
ACID-BASE DEFICIT: 3 mmol/L — AB (ref 0.0–2.0)
ACID-BASE DEFICIT: 2 mmol/L (ref 0.0–2.0)
ACID-BASE EXCESS: 1 mmol/L (ref 0.0–2.0)
Acid-Base Excess: 1 mmol/L (ref 0.0–2.0)
BICARBONATE: 21.6 mmol/L (ref 20.0–28.0)
BICARBONATE: 25.7 mmol/L (ref 20.0–28.0)
Bicarbonate: 25.5 mmol/L (ref 20.0–28.0)
Bicarbonate: 27.4 mmol/L (ref 20.0–28.0)
Bicarbonate: 24.6 mmol/L (ref 20.0–28.0)
O2 SAT: 100 %
O2 SAT: 95 %
O2 SAT: 98 %
O2 Saturation: 96 %
O2 Saturation: 98 %
PCO2 ART: 40.7 mmHg (ref 32.0–48.0)
PCO2 ART: 49.5 mmHg — AB (ref 32.0–48.0)
PCO2 ART: 45.3 mmHg (ref 32.0–48.0)
PO2 ART: 105 mmHg (ref 83.0–108.0)
Patient temperature: 98.2
Patient temperature: 97.3
TCO2: 27 mmol/L (ref 22–32)
TCO2: 23 mmol/L (ref 22–32)
TCO2: 27 mmol/L (ref 22–32)
TCO2: 29 mmol/L (ref 22–32)
TCO2: 26 mmol/L (ref 22–32)
pCO2 arterial: 40.8 mmHg (ref 32.0–48.0)
pCO2 arterial: 59.5 mmHg — ABNORMAL HIGH (ref 32.0–48.0)
pH, Arterial: 7.405 (ref 7.350–7.450)
pH, Arterial: 7.331 — ABNORMAL LOW (ref 7.350–7.450)
pH, Arterial: 7.244 — ABNORMAL LOW (ref 7.350–7.450)
pH, Arterial: 7.35 (ref 7.350–7.450)
pH, Arterial: 7.339 — ABNORMAL LOW (ref 7.350–7.450)
pO2, Arterial: 465 mmHg — ABNORMAL HIGH (ref 83.0–108.0)
pO2, Arterial: 80 mmHg — ABNORMAL LOW (ref 83.0–108.0)
pO2, Arterial: 102 mmHg (ref 83.0–108.0)
pO2, Arterial: 117 mmHg — ABNORMAL HIGH (ref 83.0–108.0)

## 2017-10-21 LAB — PROTIME-INR
INR: 1.2
Prothrombin Time: 15.1 seconds (ref 11.4–15.2)

## 2017-10-21 LAB — CBC
HEMATOCRIT: 42.3 % (ref 36.0–46.0)
HEMATOCRIT: 39.2 % (ref 36.0–46.0)
HEMOGLOBIN: 14.1 g/dL (ref 12.0–15.0)
Hemoglobin: 13 g/dL (ref 12.0–15.0)
MCH: 33.6 pg (ref 26.0–34.0)
MCH: 33.2 pg (ref 26.0–34.0)
MCHC: 33.3 g/dL (ref 30.0–36.0)
MCHC: 33.2 g/dL (ref 30.0–36.0)
MCV: 100.7 fL — AB (ref 78.0–100.0)
MCV: 100.3 fL — ABNORMAL HIGH (ref 78.0–100.0)
PLATELETS: 71 10*3/uL — AB (ref 150–400)
PLATELETS: 71 10*3/uL — AB (ref 150–400)
RBC: 4.2 MIL/uL (ref 3.87–5.11)
RBC: 3.91 MIL/uL (ref 3.87–5.11)
RDW: 13 % (ref 11.5–15.5)
RDW: 13 % (ref 11.5–15.5)
WBC: 21 10*3/uL — AB (ref 4.0–10.5)
WBC: 20.5 10*3/uL — AB (ref 4.0–10.5)

## 2017-10-21 LAB — GLUCOSE, CAPILLARY
GLUCOSE-CAPILLARY: 150 mg/dL — AB (ref 70–99)
Glucose-Capillary: 129 mg/dL — ABNORMAL HIGH (ref 70–99)
Glucose-Capillary: 169 mg/dL — ABNORMAL HIGH (ref 70–99)

## 2017-10-21 LAB — CREATININE, SERUM
Creatinine, Ser: 0.67 mg/dL (ref 0.44–1.00)
GFR calc Af Amer: >60 mL/min (ref >60)

## 2017-10-21 LAB — POCT I-STAT 4, (NA,K, GLUC, HGB,HCT)
Glucose, Bld: 167 mg/dL — ABNORMAL HIGH (ref 70–99)
HCT: 39 % (ref 36.0–46.0)
HEMOGLOBIN: 13.3 g/dL (ref 12.0–15.0)
POTASSIUM: 4.2 mmol/L (ref 3.5–5.1)
SODIUM: 142 mmol/L (ref 135–145)

## 2017-10-21 LAB — PLATELET COUNT: Platelets: 83 10*3/uL — ABNORMAL LOW (ref 150–400)

## 2017-10-21 LAB — HEMOGLOBIN AND HEMATOCRIT, BLOOD
HEMATOCRIT: 30.1 % — AB (ref 36.0–46.0)
HEMOGLOBIN: 10.3 g/dL — AB (ref 12.0–15.0)

## 2017-10-21 LAB — APTT: aPTT: 25 seconds (ref 24–36)

## 2017-10-21 LAB — MAGNESIUM: Magnesium: 3.7 mg/dL — ABNORMAL HIGH (ref 1.7–2.4)

## 2017-10-21 SURGERY — REPAIR, ATRIAL SEPTAL DEFECT
Anesthesia: General | Site: Chest

## 2017-10-21 MED ORDER — MIDAZOLAM HCL 2 MG/2ML IJ SOLN: 2.0000 mg | INTRAMUSCULAR | Status: DC | PRN

## 2017-10-21 MED ORDER — CHLORHEXIDINE GLUCONATE 0.12 % MT SOLN
15.0000 mL | Freq: Once | OROMUCOSAL | Status: AC
  Administered 2017-10-21: 15 mL via OROMUCOSAL
  Filled 2017-10-21: qty 15

## 2017-10-21 MED ORDER — PROPOFOL 10 MG/ML IV BOLUS
INTRAVENOUS | Status: AC
  Filled 2017-10-21: qty 20

## 2017-10-21 MED ORDER — GLUTARALDEHYDE 0.625% SOAKING SOLUTION
TOPICAL | Status: DC
  Filled 2017-10-21: qty 50

## 2017-10-21 MED ORDER — ACETAMINOPHEN 500 MG PO TABS
1000.0000 mg | ORAL_TABLET | Freq: Four times a day (QID) | ORAL | Status: DC
  Administered 2017-10-21 – 2017-10-26 (×17): 1000 mg via ORAL
  Filled 2017-10-21 (×17): qty 2

## 2017-10-21 MED ORDER — MORPHINE SULFATE (PF) 2 MG/ML IV SOLN: 1.0000 mg | INTRAVENOUS | Status: DC | PRN

## 2017-10-21 MED ORDER — PHENYLEPHRINE HCL-NACL 20-0.9 MG/250ML-% IV SOLN
0.0000 ug/min | INTRAVENOUS | Status: DC
  Filled 2017-10-21: qty 250

## 2017-10-21 MED ORDER — PHENYLEPHRINE HCL 10 MG/ML IJ SOLN
INTRAMUSCULAR | Status: DC | PRN
  Administered 2017-10-21: 80 ug via INTRAVENOUS

## 2017-10-21 MED ORDER — CHLORHEXIDINE GLUCONATE CLOTH 2 % EX PADS
6.0000 | MEDICATED_PAD | Freq: Every day | CUTANEOUS | Status: DC
  Administered 2017-10-21 – 2017-10-24 (×3): 6 via TOPICAL

## 2017-10-21 MED ORDER — PROTAMINE SULFATE 10 MG/ML IV SOLN
INTRAVENOUS | Status: AC
  Filled 2017-10-21: qty 50

## 2017-10-21 MED ORDER — PHENYLEPHRINE 40 MCG/ML (10ML) SYRINGE FOR IV PUSH (FOR BLOOD PRESSURE SUPPORT)
PREFILLED_SYRINGE | INTRAVENOUS | Status: AC
  Filled 2017-10-21: qty 10

## 2017-10-21 MED ORDER — PROTAMINE SULFATE 10 MG/ML IV SOLN
INTRAVENOUS | Status: DC | PRN
  Administered 2017-10-21: 250 mg via INTRAVENOUS

## 2017-10-21 MED ORDER — ASPIRIN 81 MG PO CHEW: 324.0000 mg | CHEWABLE_TABLET | Freq: Every day | ORAL | Status: DC

## 2017-10-21 MED ORDER — POTASSIUM CHLORIDE 10 MEQ/50ML IV SOLN: 10.0000 meq | INTRAVENOUS | Status: AC

## 2017-10-21 MED ORDER — DEXMEDETOMIDINE HCL IN NACL 200 MCG/50ML IV SOLN
INTRAVENOUS | Status: DC | PRN
  Administered 2017-10-21: .3 ug/kg/h via INTRAVENOUS

## 2017-10-21 MED ORDER — CHLORHEXIDINE GLUCONATE 0.12 % MT SOLN
15.0000 mL | OROMUCOSAL | Status: AC
  Administered 2017-10-21: 15 mL via OROMUCOSAL

## 2017-10-21 MED ORDER — ROCURONIUM BROMIDE 50 MG/5ML IV SOSY
PREFILLED_SYRINGE | INTRAVENOUS | Status: AC
  Filled 2017-10-21: qty 20

## 2017-10-21 MED ORDER — PROPOFOL 10 MG/ML IV BOLUS
INTRAVENOUS | Status: DC | PRN
  Administered 2017-10-21 (×2): 20 mg via INTRAVENOUS
  Administered 2017-10-21: 160 mg via INTRAVENOUS

## 2017-10-21 MED ORDER — METOPROLOL TARTRATE 12.5 MG HALF TABLET: 12.5000 mg | ORAL_TABLET | Freq: Once | ORAL | Status: DC

## 2017-10-21 MED ORDER — FAMOTIDINE IN NACL 20-0.9 MG/50ML-% IV SOLN
20.0000 mg | Freq: Two times a day (BID) | INTRAVENOUS | Status: DC
  Administered 2017-10-21: 20 mg via INTRAVENOUS

## 2017-10-21 MED ORDER — FENTANYL CITRATE (PF) 250 MCG/5ML IJ SOLN
INTRAMUSCULAR | Status: AC
  Filled 2017-10-21: qty 25

## 2017-10-21 MED ORDER — BUPIVACAINE 0.5 % ON-Q PUMP SINGLE CATH 400 ML
400.0000 mL | INJECTION | Status: DC
  Filled 2017-10-21: qty 400

## 2017-10-21 MED ORDER — ROCURONIUM BROMIDE 10 MG/ML (PF) SYRINGE
PREFILLED_SYRINGE | INTRAVENOUS | Status: DC | PRN
  Administered 2017-10-21: 10 mg via INTRAVENOUS
  Administered 2017-10-21: 20 mg via INTRAVENOUS
  Administered 2017-10-21: 70 mg via INTRAVENOUS
  Administered 2017-10-21 (×2): 10 mg via INTRAVENOUS

## 2017-10-21 MED ORDER — LACTATED RINGERS IV SOLN
INTRAVENOUS | Status: DC | PRN
  Administered 2017-10-21: 09:00:00 via INTRAVENOUS

## 2017-10-21 MED ORDER — MAGNESIUM SULFATE 4 GM/100ML IV SOLN
4.0000 g | Freq: Once | INTRAVENOUS | Status: AC
  Administered 2017-10-21: 4 g via INTRAVENOUS
  Filled 2017-10-21: qty 100

## 2017-10-21 MED ORDER — SODIUM CHLORIDE 0.9 % IV SOLN
INTRAVENOUS | Status: DC | PRN
  Administered 2017-10-21: 25 ug/min via INTRAVENOUS

## 2017-10-21 MED ORDER — VANCOMYCIN HCL IN DEXTROSE 1-5 GM/200ML-% IV SOLN
1000.0000 mg | Freq: Once | INTRAVENOUS | Status: AC
  Administered 2017-10-21: 1000 mg via INTRAVENOUS
  Filled 2017-10-21: qty 200

## 2017-10-21 MED ORDER — SODIUM CHLORIDE 0.9% FLUSH: 3.0000 mL | INTRAVENOUS | Status: DC | PRN

## 2017-10-21 MED ORDER — ACETAMINOPHEN 160 MG/5ML PO SOLN: 650.0000 mg | Freq: Once | ORAL | Status: AC

## 2017-10-21 MED ORDER — TRAMADOL HCL 50 MG PO TABS
50.0000 mg | ORAL_TABLET | ORAL | Status: DC | PRN
  Administered 2017-10-22: 50 mg via ORAL
  Administered 2017-10-22 (×2): 100 mg via ORAL
  Administered 2017-10-23 – 2017-10-24 (×4): 50 mg via ORAL
  Administered 2017-10-24 – 2017-10-26 (×3): 100 mg via ORAL
  Filled 2017-10-21 (×2): qty 1
  Filled 2017-10-21 (×2): qty 2
  Filled 2017-10-21: qty 1
  Filled 2017-10-21: qty 2
  Filled 2017-10-21 (×2): qty 1
  Filled 2017-10-21 (×2): qty 2

## 2017-10-21 MED ORDER — SODIUM CHLORIDE 0.9 % IV SOLN
INTRAVENOUS | Status: DC | PRN
  Administered 2017-10-21: 250 mL via INTRAVENOUS

## 2017-10-21 MED ORDER — SODIUM CHLORIDE 0.9% FLUSH
10.0000 mL | Freq: Two times a day (BID) | INTRAVENOUS | Status: DC
  Administered 2017-10-22 – 2017-10-24 (×3): 10 mL

## 2017-10-21 MED ORDER — PROTAMINE SULFATE 10 MG/ML IV SOLN
INTRAVENOUS | Status: AC
  Filled 2017-10-21: qty 5

## 2017-10-21 MED ORDER — ONDANSETRON HCL 4 MG/2ML IJ SOLN
INTRAMUSCULAR | Status: DC | PRN
  Administered 2017-10-21: 4 mg via INTRAVENOUS

## 2017-10-21 MED ORDER — HEPARIN SODIUM (PORCINE) 1000 UNIT/ML IJ SOLN
INTRAMUSCULAR | Status: AC
  Filled 2017-10-21: qty 1

## 2017-10-21 MED ORDER — INSULIN ASPART 100 UNIT/ML ~~LOC~~ SOLN
0.0000 [IU] | SUBCUTANEOUS | Status: DC
  Administered 2017-10-21: 3 [IU] via SUBCUTANEOUS
  Administered 2017-10-21 (×2): 4 [IU] via SUBCUTANEOUS
  Administered 2017-10-22: 3 [IU] via SUBCUTANEOUS

## 2017-10-21 MED ORDER — PANTOPRAZOLE SODIUM 40 MG PO TBEC
40.0000 mg | DELAYED_RELEASE_TABLET | Freq: Every day | ORAL | Status: DC
  Administered 2017-10-23 – 2017-10-26 (×4): 40 mg via ORAL
  Filled 2017-10-21 (×4): qty 1

## 2017-10-21 MED ORDER — ACETAMINOPHEN 160 MG/5ML PO SOLN: 1000.0000 mg | Freq: Four times a day (QID) | ORAL | Status: DC

## 2017-10-21 MED ORDER — SODIUM CHLORIDE 0.9% FLUSH: 10.0000 mL | INTRAVENOUS | Status: DC | PRN

## 2017-10-21 MED ORDER — SODIUM CHLORIDE 0.9 % IV SOLN
250.0000 mL | INTRAVENOUS | Status: DC
  Administered 2017-10-22: 250 mL via INTRAVENOUS

## 2017-10-21 MED ORDER — DOCUSATE SODIUM 100 MG PO CAPS
200.0000 mg | ORAL_CAPSULE | Freq: Every day | ORAL | Status: DC
  Administered 2017-10-22 – 2017-10-26 (×4): 200 mg via ORAL
  Filled 2017-10-21 (×5): qty 2

## 2017-10-21 MED ORDER — ACETAMINOPHEN 650 MG RE SUPP
650.0000 mg | Freq: Once | RECTAL | Status: AC
  Administered 2017-10-21: 650 mg via RECTAL

## 2017-10-21 MED ORDER — MIDAZOLAM HCL 5 MG/5ML IJ SOLN
INTRAMUSCULAR | Status: DC | PRN
  Administered 2017-10-21 (×3): 1 mg via INTRAVENOUS

## 2017-10-21 MED ORDER — DEXMEDETOMIDINE HCL IN NACL 200 MCG/50ML IV SOLN
0.0000 ug/kg/h | INTRAVENOUS | Status: DC
  Filled 2017-10-21: qty 50

## 2017-10-21 MED ORDER — SODIUM CHLORIDE 0.9 % IV SOLN: INTRAVENOUS | Status: DC

## 2017-10-21 MED ORDER — SODIUM CHLORIDE 0.9 % IV SOLN
INTRAVENOUS | Status: DC | PRN
  Administered 2017-10-21: 10 ug/min via INTRAVENOUS

## 2017-10-21 MED ORDER — SUGAMMADEX SODIUM 200 MG/2ML IV SOLN
INTRAVENOUS | Status: DC | PRN
  Administered 2017-10-21: 149.6 mg via INTRAVENOUS

## 2017-10-21 MED ORDER — HEPARIN SODIUM (PORCINE) 1000 UNIT/ML IJ SOLN
INTRAMUSCULAR | Status: DC | PRN
  Administered 2017-10-21: 27000 [IU] via INTRAVENOUS

## 2017-10-21 MED ORDER — FENTANYL CITRATE (PF) 100 MCG/2ML IJ SOLN
50.0000 ug | INTRAMUSCULAR | Status: DC | PRN
  Administered 2017-10-21 (×4): 50 ug via INTRAVENOUS
  Filled 2017-10-21 (×4): qty 2

## 2017-10-21 MED ORDER — EPHEDRINE 5 MG/ML INJ
INTRAVENOUS | Status: AC
  Filled 2017-10-21: qty 10

## 2017-10-21 MED ORDER — SODIUM CHLORIDE 0.9 % IV SOLN
INTRAVENOUS | Status: DC
  Filled 2017-10-21: qty 1

## 2017-10-21 MED ORDER — BUPIVACAINE HCL (PF) 0.5 % IJ SOLN
INTRAMUSCULAR | Status: AC
  Filled 2017-10-21: qty 10

## 2017-10-21 MED ORDER — SODIUM CHLORIDE 0.9 % IV SOLN
1.5000 g | Freq: Two times a day (BID) | INTRAVENOUS | Status: AC
  Administered 2017-10-21 – 2017-10-23 (×4): 1.5 g via INTRAVENOUS
  Filled 2017-10-21 (×4): qty 1.5

## 2017-10-21 MED ORDER — INSULIN REGULAR BOLUS VIA INFUSION
0.0000 [IU] | Freq: Three times a day (TID) | INTRAVENOUS | Status: DC
  Filled 2017-10-21: qty 10

## 2017-10-21 MED ORDER — ORAL CARE MOUTH RINSE: 15.0000 mL | Freq: Two times a day (BID) | OROMUCOSAL | Status: DC

## 2017-10-21 MED ORDER — MIDAZOLAM HCL 10 MG/2ML IJ SOLN
INTRAMUSCULAR | Status: AC
  Filled 2017-10-21: qty 2

## 2017-10-21 MED ORDER — SUGAMMADEX SODIUM 500 MG/5ML IV SOLN
INTRAVENOUS | Status: AC
  Filled 2017-10-21: qty 5

## 2017-10-21 MED ORDER — BISACODYL 10 MG RE SUPP: 10.0000 mg | Freq: Every day | RECTAL | Status: DC

## 2017-10-21 MED ORDER — SODIUM CHLORIDE 0.9 % IV SOLN
INTRAVENOUS | Status: DC | PRN
  Administered 2017-10-21: 750 mg via INTRAVENOUS

## 2017-10-21 MED ORDER — ONDANSETRON HCL 4 MG/2ML IJ SOLN
4.0000 mg | Freq: Four times a day (QID) | INTRAMUSCULAR | Status: DC | PRN
  Administered 2017-10-21 – 2017-10-23 (×4): 4 mg via INTRAVENOUS
  Filled 2017-10-21 (×5): qty 2

## 2017-10-21 MED ORDER — ONDANSETRON HCL 4 MG/2ML IJ SOLN
INTRAMUSCULAR | Status: AC
  Filled 2017-10-21: qty 2

## 2017-10-21 MED ORDER — BUPIVACAINE HCL (PF) 0.5 % IJ SOLN
INTRAMUSCULAR | Status: DC | PRN
  Administered 2017-10-21: 5 mL

## 2017-10-21 MED ORDER — SODIUM CHLORIDE 0.45 % IV SOLN
INTRAVENOUS | Status: DC | PRN
  Administered 2017-10-21: 14:00:00 via INTRAVENOUS

## 2017-10-21 MED ORDER — CHLORHEXIDINE GLUCONATE 4 % EX LIQD: 30.0000 mL | CUTANEOUS | Status: DC

## 2017-10-21 MED ORDER — LACTATED RINGERS IV SOLN: INTRAVENOUS | Status: DC

## 2017-10-21 MED ORDER — LACTATED RINGERS IV SOLN: 500.0000 mL | Freq: Once | INTRAVENOUS | Status: DC | PRN

## 2017-10-21 MED ORDER — SODIUM CHLORIDE 0.9% FLUSH
3.0000 mL | Freq: Two times a day (BID) | INTRAVENOUS | Status: DC
  Administered 2017-10-22 – 2017-10-25 (×4): 3 mL via INTRAVENOUS

## 2017-10-21 MED ORDER — NITROGLYCERIN IN D5W 200-5 MCG/ML-% IV SOLN: 0.0000 ug/min | INTRAVENOUS | Status: DC

## 2017-10-21 MED ORDER — ASPIRIN EC 325 MG PO TBEC: 325.0000 mg | DELAYED_RELEASE_TABLET | Freq: Every day | ORAL | Status: DC

## 2017-10-21 MED ORDER — SODIUM CHLORIDE 0.9 % IV SOLN
INTRAVENOUS | Status: AC
  Administered 2017-10-21: 15:00:00 via INTRAVENOUS

## 2017-10-21 MED ORDER — ALBUMIN HUMAN 5 % IV SOLN: 250.0000 mL | INTRAVENOUS | Status: AC | PRN

## 2017-10-21 MED ORDER — MORPHINE SULFATE (PF) 2 MG/ML IV SOLN
1.0000 mg | INTRAVENOUS | Status: DC | PRN
  Filled 2017-10-21: qty 1

## 2017-10-21 MED ORDER — BISACODYL 5 MG PO TBEC
10.0000 mg | DELAYED_RELEASE_TABLET | Freq: Every day | ORAL | Status: DC
  Administered 2017-10-22 – 2017-10-26 (×3): 10 mg via ORAL
  Filled 2017-10-21 (×5): qty 2

## 2017-10-21 MED ORDER — OXYCODONE HCL 5 MG PO TABS
5.0000 mg | ORAL_TABLET | ORAL | Status: DC | PRN
  Administered 2017-10-21 – 2017-10-23 (×4): 5 mg via ORAL
  Administered 2017-10-24 – 2017-10-26 (×7): 10 mg via ORAL
  Filled 2017-10-21 (×2): qty 1
  Filled 2017-10-21 (×3): qty 2
  Filled 2017-10-21 (×2): qty 1
  Filled 2017-10-21 (×2): qty 2
  Filled 2017-10-21: qty 1
  Filled 2017-10-21: qty 2

## 2017-10-21 MED ORDER — METOPROLOL TARTRATE 5 MG/5ML IV SOLN: 2.5000 mg | INTRAVENOUS | Status: DC | PRN

## 2017-10-21 MED ORDER — FENTANYL CITRATE (PF) 100 MCG/2ML IJ SOLN
INTRAMUSCULAR | Status: DC | PRN
  Administered 2017-10-21 (×4): 50 ug via INTRAVENOUS
  Administered 2017-10-21: 100 ug via INTRAVENOUS
  Administered 2017-10-21 (×2): 50 ug via INTRAVENOUS
  Administered 2017-10-21: 300 ug via INTRAVENOUS

## 2017-10-21 SURGICAL SUPPLY — 100 items
ADAPTER CARDIO PERF ANTE/RETRO (ADAPTER) ×3 IMPLANT
ADH SKN CLS APL DERMABOND .7 (GAUZE/BANDAGES/DRESSINGS) ×2
ADPR PRFSN 84XANTGRD RTRGD (ADAPTER) ×2
ATTRACTOMAT 16X20 MAGNETIC DRP (DRAPES) ×2 IMPLANT
BAG DECANTER FOR FLEXI CONT (MISCELLANEOUS) ×3 IMPLANT
BLADE CLIPPER SURG (BLADE) IMPLANT
BLADE STERNUM SYSTEM 6 (BLADE) ×3 IMPLANT
BLADE SURG 11 STRL SS (BLADE) ×3 IMPLANT
CANISTER SUCT 3000ML PPV (MISCELLANEOUS) ×3 IMPLANT
CANNULA AORTIC ROOT 20012 (MISCELLANEOUS) ×3 IMPLANT
CANNULA ARTERIAL NVNT 3/8 22FR (MISCELLANEOUS) IMPLANT
CANNULA GUNDRY RCSP 15FR (MISCELLANEOUS) ×1 IMPLANT
CANNULA SUMP PERICARDIAL (CANNULA) ×1 IMPLANT
CATH CPB KIT OWEN (MISCELLANEOUS) ×3 IMPLANT
CATH HEART VENT LEFT (CATHETERS) ×2 IMPLANT
CATH KIT ON-Q SILVERSOAK 5 (CATHETERS) IMPLANT
CATH KIT ON-Q SILVERSOAK 5IN (CATHETERS) ×3 IMPLANT
CATH ROBINSON RED A/P 18FR (CATHETERS) ×9 IMPLANT
CATH THORACIC 36FR (CATHETERS) ×3 IMPLANT
CATH THORACIC 36FR RT ANG (CATHETERS) ×3 IMPLANT
CONN 1/2X1/2X1/2  BEN (MISCELLANEOUS) ×1
CONN 1/2X1/2X1/2 BEN (MISCELLANEOUS) ×2 IMPLANT
CONN 3/8X1/2 ST GISH (MISCELLANEOUS) ×6 IMPLANT
CONN ST 1/4X3/8  BEN (MISCELLANEOUS) ×2
CONN ST 1/4X3/8 BEN (MISCELLANEOUS) IMPLANT
CONNECTOR 1/2X3/8X1/2 3 WAY (MISCELLANEOUS) ×1
CONNECTOR 1/2X3/8X1/2 3WAY (MISCELLANEOUS) IMPLANT
COVER PROBE W GEL 5X96 (DRAPES) ×1 IMPLANT
COVER WAND RF STERILE (DRAPES) ×3 IMPLANT
CRADLE DONUT ADULT HEAD (MISCELLANEOUS) ×3 IMPLANT
DERMABOND ADVANCED (GAUZE/BANDAGES/DRESSINGS) ×1
DERMABOND ADVANCED .7 DNX12 (GAUZE/BANDAGES/DRESSINGS) IMPLANT
DRAIN CHANNEL 32F RND 10.7 FF (WOUND CARE) ×2 IMPLANT
DRAPE C-ARM 42X72 X-RAY (DRAPES) ×1 IMPLANT
DRAPE CARDIOVASCULAR INCISE (DRAPES) ×3
DRAPE SLUSH/WARMER DISC (DRAPES) ×3 IMPLANT
DRAPE SRG 135X102X78XABS (DRAPES) ×2 IMPLANT
DRSG AQUACEL AG ADV 3.5X10 (GAUZE/BANDAGES/DRESSINGS) ×1 IMPLANT
DRSG COVADERM 4X14 (GAUZE/BANDAGES/DRESSINGS) ×3 IMPLANT
ELECT BLADE 6.5 EXT (BLADE) ×1 IMPLANT
ELECT REM PT RETURN 9FT ADLT (ELECTROSURGICAL) ×6
ELECTRODE REM PT RTRN 9FT ADLT (ELECTROSURGICAL) ×4 IMPLANT
GAUZE SPONGE 4X4 12PLY STRL (GAUZE/BANDAGES/DRESSINGS) ×5 IMPLANT
GAUZE SPONGE 4X4 12PLY STRL LF (GAUZE/BANDAGES/DRESSINGS) ×1 IMPLANT
GLOVE ORTHO TXT STRL SZ7.5 (GLOVE) ×9 IMPLANT
GOWN STRL REUS W/ TWL LRG LVL3 (GOWN DISPOSABLE) ×8 IMPLANT
GOWN STRL REUS W/TWL LRG LVL3 (GOWN DISPOSABLE) ×12
HEMOSTAT POWDER SURGIFOAM 1G (HEMOSTASIS) ×6 IMPLANT
INSERT FOGARTY XLG (MISCELLANEOUS) ×3 IMPLANT
KIT BASIN OR (CUSTOM PROCEDURE TRAY) ×3 IMPLANT
KIT DILATOR VASC 18G NDL (KITS) ×1 IMPLANT
KIT DRAINAGE VACCUM ASSIST (KITS) ×1 IMPLANT
KIT SUCTION CATH 14FR (SUCTIONS) ×3 IMPLANT
KIT TURNOVER KIT B (KITS) ×3 IMPLANT
LEAD PACING MYOCARDI (MISCELLANEOUS) ×3 IMPLANT
LINE VENT (MISCELLANEOUS) ×1 IMPLANT
NDL AORTIC ROOT 14G 7F (CATHETERS) IMPLANT
NEEDLE AORTIC ROOT 14G 7F (CATHETERS) ×3 IMPLANT
NS IRRIG 1000ML POUR BTL (IV SOLUTION) ×13 IMPLANT
PACK E OPEN HEART (SUTURE) ×2 IMPLANT
PACK OPEN HEART (CUSTOM PROCEDURE TRAY) ×3 IMPLANT
PAD ARMBOARD 7.5X6 YLW CONV (MISCELLANEOUS) ×6 IMPLANT
SET CARDIOPLEGIA MPS 5001102 (MISCELLANEOUS) ×1 IMPLANT
STRIP PERIGUARD 6X8 (Vascular Products) ×1 IMPLANT
SUCKER INTRACARDIAC WEIGHTED (SUCKER) ×3 IMPLANT
SUT BONE WAX W31G (SUTURE) ×3 IMPLANT
SUT ETHIBOND 2 0 SH (SUTURE) IMPLANT
SUT ETHIBOND 2 0 SH 36X2 (SUTURE) IMPLANT
SUT ETHIBOND 2 0 V4 (SUTURE) IMPLANT
SUT ETHIBOND 2 0V4 GREEN (SUTURE) IMPLANT
SUT ETHIBOND X763 2 0 SH 1 (SUTURE) ×9 IMPLANT
SUT GORETEX CV 4 TH 22 36 (SUTURE) ×1 IMPLANT
SUT GORETEX CV4 TH-18 (SUTURE) ×2 IMPLANT
SUT MNCRL AB 3-0 PS2 18 (SUTURE) ×6 IMPLANT
SUT MNCRL AB 4-0 PS2 18 (SUTURE) ×6 IMPLANT
SUT PDS AB 1 CTX 36 (SUTURE) ×6 IMPLANT
SUT PROLENE 3 0 SH DA (SUTURE) ×4 IMPLANT
SUT PROLENE 3 0 SH1 36 (SUTURE) ×5 IMPLANT
SUT PROLENE 4 0 RB 1 (SUTURE) ×39
SUT PROLENE 4-0 RB1 .5 CRCL 36 (SUTURE) ×4 IMPLANT
SUT PROLENE 5 0 CC1 (SUTURE) IMPLANT
SUT SILK  1 MH (SUTURE) ×1
SUT SILK 1 MH (SUTURE) ×2 IMPLANT
SUT STEEL 6MS V (SUTURE) IMPLANT
SUT VIC AB 2-0 CTX 27 (SUTURE) IMPLANT
SUT VIC AB 3-0 X1 27 (SUTURE) IMPLANT
SYSTEM SAHARA CHEST DRAIN ATS (WOUND CARE) ×3 IMPLANT
TAPE CLOTH SURG 4X10 WHT LF (GAUZE/BANDAGES/DRESSINGS) ×1 IMPLANT
TOWEL GREEN STERILE (TOWEL DISPOSABLE) ×3 IMPLANT
TOWEL GREEN STERILE FF (TOWEL DISPOSABLE) ×3 IMPLANT
TRAY FOLEY SLVR 14FR TEMP STAT (SET/KITS/TRAYS/PACK) ×3 IMPLANT
TROCAR XCEL NON-BLD 11X100MML (ENDOMECHANICALS) ×1 IMPLANT
TROCAR XCEL NON-BLD 5MMX100MML (ENDOMECHANICALS) ×1 IMPLANT
TUBE SUCT INTRACARD DLP 20F (MISCELLANEOUS) ×1 IMPLANT
TUBING INSUFFLATION 10FT LAP (TUBING) ×3 IMPLANT
TUNNELER SHEATH ON-Q 11GX8 DSP (PAIN MANAGEMENT) ×1 IMPLANT
UNDERPAD 30X30 (UNDERPADS AND DIAPERS) ×3 IMPLANT
VENT LEFT HEART 12002 (CATHETERS) ×3
WATER STERILE IRR 1000ML POUR (IV SOLUTION) ×6 IMPLANT
WIRE EMERALD 3MM-J .035X150CM (WIRE) ×1 IMPLANT

## 2017-10-21 NOTE — Anesthesia Procedure Notes (Signed)
Procedure Name: Intubation Date/Time: 10/21/2017 8:56 AM Performed by: Glynda Jaeger, CRNA Pre-anesthesia Checklist: Patient identified, Patient being monitored, Timeout performed, Emergency Drugs available and Suction available Patient Re-evaluated:Patient Re-evaluated prior to induction Oxygen Delivery Method: Circle System Utilized Preoxygenation: Pre-oxygenation with 100% oxygen Induction Type: IV induction Ventilation: Mask ventilation without difficulty Laryngoscope Size: Mac and 4 Grade View: Grade I Tube type: Oral Endobronchial tube: Double lumen EBT and EBT position confirmed by fiberoptic bronchoscope and 37 Fr Number of attempts: 1 Airway Equipment and Method: Stylet Placement Confirmation: ETT inserted through vocal cords under direct vision,  positive ETCO2 and breath sounds checked- equal and bilateral Tube secured with: Tape Dental Injury: Teeth and Oropharynx as per pre-operative assessment

## 2017-10-21 NOTE — Op Note (Addendum)
CARDIOTHORACIC SURGERY OPERATIVE NOTE  Date of Procedure:  10/21/2017  Preoperative Diagnosis:   Sinus Venosus Atrial Septal Defect  Partial Anomalous Pulmonary Venous Return  Postoperative Diagnosis:   Sinus Venosus Atrial Septal Defect  Partial Anomalous Pulmonary Venous Return  Patent Foramen Ovale  Procedure:    Minimally-Invasive Repair of Sinus Venosus Atrial Septal Defect  Double patch repair including autologous pericardial patch closure of ASD and correction of psrtial anomalous pulmonary venous return  Bovine pericardial patch closure of superior vena cava  Closure of patent foramen ovale    Surgeon: Valentina Gu. 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  The patient describes a several year history of symptoms of substernal chest pain that occur both with activity and at rest. Symptoms are described as a dull pressure-like discomfort across the chest that is typically exacerbated by physical activity and alleviated by rest but occasionally occurs with minimal activity or at rest. Symptoms are sometimes associated with shortness of breath. Patient has developed progressive fatigue and shortness of breath with more strenuous exertion. She was initially evaluated by Dr. Jerrell Mylar than a year ago where she underwent echocardiogram and stress echocardiogram. Echocardiogram performed August 29, 2016 revealed  normal left ventricular size and systolic function. There was septal flattening suggesting right ventricular pressure overload in the right ventricle was moderately dilated. The right atrium was mildly dilated. There were no significant valvular abnormalities. Stress echocardiography was performed and although the patient only reached 74% of target heart rate there was felt to be no evidence for ischemia. The patient subsequently underwent nuclear medicine stress test that was felt to be low risk with ejection fraction calculated 69%. The patient was initially lost to follow-up for a period of time, but she returned earlier this summer with worsening symptoms. She underwent left and right heart catheterization by Dr. Tamala Julian on August 05, 2017. She was found to have normal coronary arteries with normal left ventricular size and systolic function. Patient was noted to have a large left to right shunt at the atrial level with Qp/Qs measured greater than 2.7 to1. Cardiac gated CT angiogram of the heart was recommended to evaluate possible congenital heart disease such as anomalous pulmonary venous return and/or atrial septal defect. The patient subsequently underwent cardiac gated CTA but it was ordered and performed as a coronary study and reported normal. Study was reportedly subsequently reviewed and a sinus venosus atrial septal defect discovered. The patient was referred for surgical consultation.  The patient has been seen in consultation and counseled at length regarding the indications, risks and potential benefits of surgery.  All questions have been answered, and the patient provides full informed consent for the operation as described.    DETAILS OF THE OPERATIVE PROCEDURE  Preparation:  The patient is brought to the operating room on the above mentioned date and central monitoring was established by the anesthesia team including placement of central venous catheter through the left  internal jugular  vein.  A radial arterial line is placed. The patient is placed in the supine position on the operating table.  Intravenous antibiotics are administered. General endotracheal anesthesia is induced uneventfully. The patient is initially intubated using a dual lumen endotracheal tube.  A Foley catheter is placed.  Baseline transesophageal echocardiogram was performed.  Findings were notable for a large atrial septal defect located along the superior and posterior aspect of the intra-atrial septum.  There was a small patent foramen ovale in the fossa ovalis.  There was normal left ventricular systolic function.  The right ventricle was enlarged but there was normal right ventricular function.  A soft roll is placed behind the patient's left scapula and the neck gently extended and turned to the left.   The patient's right neck, chest, abdomen, both groins, and both lower extremities are prepared and draped in a sterile manner. A time out procedure is performed.   Surgical Approach:  A right miniature anterolateral thoracotomy incision is performed. The incision is placed just lateral to and superior to the right nipple. The pectoralis major muscle is retracted medially and completely preserved. The right pleural space is entered through the 3rd intercostal space. A soft tissue retractor is placed.  Two 11 mm ports are placed through separate stab incisions inferiorly. The right pleural space is insufflated continuously with carbon dioxide gas through the posterior port during the remainder of the operation.  A pledgeted sutures placed through the dome of the right hemidiaphragm and retracted inferiorly to facilitate exposure.  A longitudinal incision is made in the pericardium 3 cm anterior to the phrenic nerve and silk traction sutures are placed on either side of the incision for exposure.   Extracorporeal Cardiopulmonary Bypass and Myocardial Protection:  A small incision is made in the  right inguinal crease and the anterior surface of the right common femoral artery and right common femoral vein are identified.  The patient is placed in Trendelenburg position. The right internal jugular vein is cannulated with Seldinger technique and a guidewire advanced into the right atrium. The patient is heparinized systemically. The right internal jugular vein is cannulated with a 14 [place] pediatric femoral venous cannula. Pursestring sutures are placed on the anterior surface of the right common femoral vein and right common femoral artery. The right common femoral vein is cannulated with the Seldinger technique and a guidewire is advanced under transesophageal echocardiogram guidance through the right atrium. The femoral vein is cannulated with a long 22 French femoral venous cannula. The right common femoral artery is cannulated with Seldinger technique and a flexible guidewire is advanced until it can be appreciated intraluminally in the descending thoracic aorta on transesophageal echocardiogram. The femoral artery is cannulated with an 18 French femoral arterial cannula.  Adequate heparinization is verified.     The entire pre-bypass portion of the operation was notable for stable hemodynamics.  Cardiopulmonary bypass was begun.  Vacuum assist venous drainage is utilized. The incision in the pericardium is extended in both directions. Venous drainage and exposure are notably excellent.  A large portion of the patient's pericardium is removed and subsequently tanned in 0.625% glutaraldehyde solution for 3 minutes followed by soaks and consecutive baths of saline for 30 minutes.  An antegrade cardioplegia cannula is placed in the ascending aorta.    The reflection of the pericardium is incised overlying the junction of the superior vena cava and the right atrium.  Superior vena cava is dissected proximally and the right superior pulmonary vein can  be easily appreciated draining and appropriately  into the superior vena cava.  An umbilical tape was placed around the superior vena cava above the anomalous pulmonary vein.  A second umbilical tape was placed around the inferior vena cava.  The patient is cooled to 32C systemic temperature.  The aortic cross clamp is applied and cardioplegia is delivered initially in an antegrade fashion through the aortic root using modified [place] cold blood cardioplegia ([facility] blood cardioplegia protocol).   The initial cardioplegic arrest is rapid with early diastolic arrest.  Myocardial protection was felt to be excellent.   Repair of Sinus Venosus Type Atrial Septal Defect, Correction of Partial Anomalous Pulmonary Venous Return, and Closure of Patent Foramen Ovale:  A longitudinal incision is made in the distal aspect of the superior vena cava and the proximal lateral wall of the right atrium.  The inferior vena cava cannula is pulled down until its tip is outside of the right atrium and both umbilical tapes were snared.  The right superior pulmonary vein can be appreciated draining into the sidewall of the superior vena cava.  There is a very large sinus stenosis type atrial septal defect.  The right inferior pulmonary vein drains into the sidewall of the left atrium.  There is a patent foramen ovale.  A sump drain is placed across the atrial septal defect through the left atrium into the left superior pulmonary vein to service event.  The patent foramen ovale is closed using running 4-0 Prolene suture.  A pair of pledgeted Prolene sutures were placed around the edge of the right atriotomy incision to facilitate exposure.  The patient's autologous pericardial patch is trimmed to a long elliptical shape to be utilized for baffle repair of the partially anomalous pulmonary venous return and closure of the atrial septal defect.  The patch is sewn along the back wall the superior vena cava to redirect the blood flow from the right superior pulmonary vein  across the atrial septal defect into the left atrium.  The patch is sewn circumferentially using 2 layer closure of running 4-0 Prolene suture.  The left ventricular vent is left in place with a single pledgeted suture on Rummel tourniquet.  Once the patch repair of the sinus stenosis atrial septal defect and partially anomalous pulmonary venous return is been completed, the right atriotomy incision and incision extending into the superior vena cava is closed using a second elliptical patch constructed from bovine pericardium and effort to avoid venous obstruction of the drainage from the superior vena cava into the right atrium.  This patch is sewn in place with running 4-0 Prolene suture.   Procedure Completion:  One final dose of warm "reanimation dose" cardioplegia was administered through the aortic root.  The aortic cross clamp was removed after a total cross clamp time of 68 minutes.  Epicardial pacing wires are fixed to the inferior wall of the right ventricule and to the right atrial appendage. The patient is rewarmed to 37C temperature. The left ventricular vent is removed.  The antegrade cardioplegia cannula iremoved. The patient is weaned and disconnected from cardiopulmonary bypass.  The patient's rhythm at separation from bypass was AV paced.  The patient was weaned from bypass without any inotropic support. Total cardiopulmonary bypass time for the operation was 118 minutes.  Followup transesophageal echocardiogram performed after separation from bypass revealed no residual intra-atrial communication at any level.  Left ventricular function remain normal.  There was turbulent blood flow traversing through the superior vena  cava and the right atrium, but flow did not appear to be significantly obstructed.  The femoral arterial and venous cannulae were removed uneventfully. There was a palpable pulse in the distal right common femoral artery after removal of the cannula. Protamine was  administered to reverse the anticoagulation. The right internal jugular cannula was removed and manual pressure held on the neck for 15 minutes.  Single lung ventilation was begun. The atriotomy closure was inspected for hemostasis. The pericardial sac was drained using a 28 French Bard drain placed through the anterior port incision.  The right pleural space is irrigated with saline solution and inspected for hemostasis.   The On-Q pain management system is utilized for postoperative analgesia.  A single lumen catheter is passed through the subcutaneous tissues from the anterior chest wall to the posterior port incision.  The catheter was then passed through the port incision into the pleural space and tunneled into the subpleural space posteriorly to cover the second through the sixth intercostal nerve roots.  The catheter was flushed with 0.5% bupivacaine solution and ultimately connected to a continuous infusion pump.  The right pleural space was drained using a 28 French Bard drain placed through the posterior port incision. The miniature thoracotomy incision was closed in multiple layers in routine fashion. The right groin incision was inspected for hemostasis and closed in multiple layers in routine fashion.  The post-bypass portion of the operation was notable for stable rhythm and hemodynamics.  No blood products were administered during the operation.   Disposition:  The patient tolerated the procedure well.  The patient was extubated in the operating room and transported to the surgical intensive care unit in stable condition. There were no intraoperative complications. All sponge instrument and needle counts are verified correct at completion of the operation.     Valentina Gu. [name] MD 10/21/2017 1:38 PM

## 2017-10-21 NOTE — Interval H&P Note (Signed)
History and Physical Interval Note:  10/21/2017 7:06 AM  Kristy Lewis  has presented today for surgery, with the diagnosis of SINUS VENOSUS ATRIAL SEPTAL DEFECT  The various methods of treatment have been discussed with the patient and family. After consideration of risks, benefits and other options for treatment, the patient has consented to  Procedure(s): MINIMALLY INVASIVE REPAIR OF SINUS VENOSUS ATRIAL SEPTAL DEFECT (ASD) (N/A) TRANSESOPHAGEAL ECHOCARDIOGRAM (TEE) (N/A) as a surgical intervention .  The patient's history has been reviewed, patient examined, no change in status, stable for surgery.  I have reviewed the patient's chart and labs.  Questions were answered to the patient's satisfaction.     Rexene Alberts

## 2017-10-21 NOTE — Anesthesia Procedure Notes (Signed)
Arterial Line Insertion Start/End10/02/2017 7:45 AM, 10/21/2017 7:53 AM Performed by: Verdie Drown, CRNA, CRNA  Preanesthetic checklist: patient identified, IV checked, site marked, risks and benefits discussed, surgical consent, monitors and equipment checked, pre-op evaluation, timeout performed and anesthesia consent Lidocaine 1% used for infiltration and patient sedated Left, radial was placed Catheter size: 20 G Hand hygiene performed  and maximum sterile barriers used  Allen's test indicative of satisfactory collateral circulation Attempts: 2 Procedure performed without using ultrasound guided technique. Following insertion, dressing applied and Biopatch. Post procedure assessment: normal  Patient tolerated the procedure well with no immediate complications.

## 2017-10-21 NOTE — Anesthesia Procedure Notes (Signed)
Central Venous Catheter Insertion Performed by: Roberts Gaudy, MD, anesthesiologist Start/End10/02/2017 7:50 AM, 10/21/2017 8:00 AM Patient location: Pre-op. Preanesthetic checklist: patient identified, IV checked, site marked, risks and benefits discussed, surgical consent, monitors and equipment checked, pre-op evaluation, timeout performed and anesthesia consent Lidocaine 1% used for infiltration and patient sedated Hand hygiene performed  and maximum sterile barriers used  Catheter size: 8 Fr Total catheter length 16. Central line was placed.Double lumen Procedure performed using ultrasound guided technique. Ultrasound Notes:image(s) printed for medical record Attempts: 1 Following insertion, dressing applied and line sutured. Post procedure assessment: blood return through all ports  Patient tolerated the procedure well with no immediate complications.

## 2017-10-21 NOTE — Transfer of Care (Signed)
Immediate Anesthesia Transfer of Care Note  Patient: Kristy Lewis  Procedure(s) Performed: MINIMALLY INVASIVE REPAIR OF SINUS VENOSUS ATRIAL SEPTAL DEFECT (ASD), CORRECTION OF PARTIAL ANOMALOUS PULMONARY VENOUS RETURN, CLOSURE OF PATENT FORAMEN OVALE (N/A Chest) TRANSESOPHAGEAL ECHOCARDIOGRAM (TEE) (N/A )  Patient Location: ICU  Anesthesia Type:General  Level of Consciousness: awake, patient cooperative and responds to stimulation  Airway & Oxygen Therapy: Patient Spontanous Breathing and Patient connected to face mask oxygen  Post-op Assessment: Report given to RN, Post -op Vital signs reviewed and stable and Patient moving all extremities  Post vital signs: Reviewed and stable  Last Vitals:  Vitals Value Taken Time  BP    Temp    Pulse    Resp 19 10/21/2017  2:28 PM  SpO2    Vitals shown include unvalidated device data.  Last Pain:  Vitals:   10/21/17 0722  TempSrc:   PainSc: 0-No pain         Complications: No apparent anesthesia complications

## 2017-10-21 NOTE — Progress Notes (Signed)
[facility] BRIEF SICU PROGRESS NOTE  Day of Surgery  S/P Procedure(s) (LRB): MINIMALLY INVASIVE REPAIR OF SINUS VENOSUS ATRIAL SEPTAL DEFECT (ASD), CORRECTION OF PARTIAL ANOMALOUS PULMONARY VENOUS RETURN, CLOSURE OF PATENT FORAMEN OVALE (N/A) TRANSESOPHAGEAL ECHOCARDIOGRAM (TEE) (N/A)   Awake and alert.  Reports pain in chest.  Hasn't received any pain medicine yet. NSR w/ stable BP Breathing comfortably on BiPAP UOP adequate Labs okay  Plan: Continue routine early postop  Rexene Alberts, MD 10/21/2017 5:12 PM

## 2017-10-21 NOTE — Brief Op Note (Signed)
10/21/2017  1:34 PM  PATIENT:  Kristy Lewis  57 y.o. female  PRE-OPERATIVE DIAGNOSIS:  SINUS VENOSUS ATRIAL SEPTAL DEFECT  POST-OPERATIVE DIAGNOSIS:  * No post-op diagnosis entered *  PROCEDURE:  Procedure(s): MINIMALLY INVASIVE REPAIR OF SINUS VENOSUS ATRIAL SEPTAL DEFECT (ASD), CORRECTION OF PARTIAL ANOMALOUS PULMONARY VENOUS RETURN, CLOSURE OF PATENT FORAMEN OVALE (N/A) TRANSESOPHAGEAL ECHOCARDIOGRAM (TEE) (N/A)  SURGEON:  Surgeon(s) and Role:    Rexene Alberts, MD - Primary  PHYSICIAN ASSISTANT:   Nicholes Rough, PA-C   ANESTHESIA:   general  EBL:  600 mL   BLOOD ADMINISTERED:none  DRAINS: ROUTINE   LOCAL MEDICATIONS USED:  NONE  SPECIMEN:  No Specimen  DISPOSITION OF SPECIMEN:  N/A  COUNTS:  YES  DICTATION: .Dragon Dictation  PLAN OF CARE: Admit to inpatient   PATIENT DISPOSITION:  ICU - intubated and hemodynamically stable.   Delay start of Pharmacological VTE agent (>24hrs) due to surgical blood loss or risk of bleeding: yes

## 2017-10-22 ENCOUNTER — Inpatient Hospital Stay (HOSPITAL_COMMUNITY): Payer: BLUE CROSS/BLUE SHIELD

## 2017-10-22 ENCOUNTER — Encounter (HOSPITAL_COMMUNITY): Payer: Self-pay | Admitting: Thoracic Surgery (Cardiothoracic Vascular Surgery)

## 2017-10-22 LAB — GLUCOSE, CAPILLARY
GLUCOSE-CAPILLARY: 163 mg/dL — AB (ref 70–99)
GLUCOSE-CAPILLARY: 128 mg/dL — AB (ref 70–99)
Glucose-Capillary: 103 mg/dL — ABNORMAL HIGH (ref 70–99)
Glucose-Capillary: 107 mg/dL — ABNORMAL HIGH (ref 70–99)
Glucose-Capillary: 118 mg/dL — ABNORMAL HIGH (ref 70–99)
Glucose-Capillary: 94 mg/dL (ref 70–99)
Glucose-Capillary: 99 mg/dL (ref 70–99)

## 2017-10-22 LAB — CBC
HCT: 39.7 % (ref 36.0–46.0)
HEMATOCRIT: 36.5 % (ref 36.0–46.0)
HEMOGLOBIN: 13.2 g/dL (ref 12.0–15.0)
HEMOGLOBIN: 11.9 g/dL — AB (ref 12.0–15.0)
MCH: 33.3 pg (ref 26.0–34.0)
MCH: 33.3 pg (ref 26.0–34.0)
MCHC: 33.2 g/dL (ref 30.0–36.0)
MCHC: 32.6 g/dL (ref 30.0–36.0)
MCV: 100.3 fL — AB (ref 78.0–100.0)
MCV: 102.2 fL — AB (ref 78.0–100.0)
Platelets: 86 10*3/uL — ABNORMAL LOW (ref 150–400)
Platelets: 71 10*3/uL — ABNORMAL LOW (ref 150–400)
RBC: 3.96 MIL/uL (ref 3.87–5.11)
RBC: 3.57 MIL/uL — AB (ref 3.87–5.11)
RDW: 13.2 % (ref 11.5–15.5)
RDW: 13.2 % (ref 11.5–15.5)
WBC: 18.2 10*3/uL — ABNORMAL HIGH (ref 4.0–10.5)
WBC: 14.5 10*3/uL — ABNORMAL HIGH (ref 4.0–10.5)

## 2017-10-22 LAB — BASIC METABOLIC PANEL
Anion gap: 5 (ref 5–15)
BUN: 9 mg/dL (ref 6–20)
CHLORIDE: 108 mmol/L (ref 98–111)
CO2: 24 mmol/L (ref 22–32)
Calcium: 7.7 mg/dL — ABNORMAL LOW (ref 8.9–10.3)
Creatinine, Ser: 0.63 mg/dL (ref 0.44–1.00)
GFR calc Af Amer: >60 mL/min (ref >60)
GFR calc non Af Amer: >60 mL/min (ref >60)
GLUCOSE: 138 mg/dL — AB (ref 70–99)
POTASSIUM: 4.4 mmol/L (ref 3.5–5.1)
SODIUM: 137 mmol/L (ref 135–145)

## 2017-10-22 LAB — POCT I-STAT, CHEM 8
BUN: 9 mg/dL (ref 6–20)
CALCIUM ION: 1.17 mmol/L (ref 1.15–1.40)
CREATININE: 0.7 mg/dL (ref 0.44–1.00)
Chloride: 101 mmol/L (ref 98–111)
Glucose, Bld: 101 mg/dL — ABNORMAL HIGH (ref 70–99)
HEMATOCRIT: 34 % — AB (ref 36.0–46.0)
HEMOGLOBIN: 11.6 g/dL — AB (ref 12.0–15.0)
Potassium: 3.8 mmol/L (ref 3.5–5.1)
SODIUM: 138 mmol/L (ref 135–145)
TCO2: 27 mmol/L (ref 22–32)

## 2017-10-22 LAB — MAGNESIUM
MAGNESIUM: 2.7 mg/dL — AB (ref 1.7–2.4)
Magnesium: 2.4 mg/dL (ref 1.7–2.4)

## 2017-10-22 LAB — CREATININE, SERUM
Creatinine, Ser: 0.66 mg/dL (ref 0.44–1.00)
GFR calc Af Amer: >60 mL/min (ref >60)
GFR calc non Af Amer: >60 mL/min (ref >60)

## 2017-10-22 MED ORDER — POTASSIUM CHLORIDE 10 MEQ/50ML IV SOLN
10.0000 meq | INTRAVENOUS | Status: AC
  Administered 2017-10-22 (×3): 10 meq via INTRAVENOUS
  Filled 2017-10-22 (×3): qty 50

## 2017-10-22 MED ORDER — WARFARIN - PHYSICIAN DOSING INPATIENT
Freq: Every day | Status: DC
  Administered 2017-10-22 – 2017-10-23 (×2)

## 2017-10-22 MED ORDER — WARFARIN SODIUM 2.5 MG PO TABS
2.5000 mg | ORAL_TABLET | Freq: Every day | ORAL | Status: DC
  Administered 2017-10-22 – 2017-10-24 (×3): 2.5 mg via ORAL
  Filled 2017-10-22 (×3): qty 1

## 2017-10-22 MED ORDER — ASPIRIN EC 81 MG PO TBEC
81.0000 mg | DELAYED_RELEASE_TABLET | Freq: Every day | ORAL | Status: DC
  Administered 2017-10-23 – 2017-10-26 (×4): 81 mg via ORAL
  Filled 2017-10-22 (×4): qty 1

## 2017-10-22 MED ORDER — KETOROLAC TROMETHAMINE 15 MG/ML IJ SOLN
15.0000 mg | Freq: Four times a day (QID) | INTRAMUSCULAR | Status: AC
  Administered 2017-10-22 – 2017-10-23 (×5): 15 mg via INTRAVENOUS
  Filled 2017-10-22 (×5): qty 1

## 2017-10-22 MED ORDER — FLUOXETINE HCL 20 MG PO CAPS
40.0000 mg | ORAL_CAPSULE | Freq: Every day | ORAL | Status: DC
  Administered 2017-10-22 – 2017-10-26 (×5): 40 mg via ORAL
  Filled 2017-10-22 (×5): qty 2

## 2017-10-22 MED ORDER — FLUOXETINE HCL 40 MG PO CAPS: 40.0000 mg | ORAL_CAPSULE | Freq: Every day | ORAL | Status: DC

## 2017-10-22 MED ORDER — ENOXAPARIN SODIUM 40 MG/0.4ML ~~LOC~~ SOLN
40.0000 mg | Freq: Every day | SUBCUTANEOUS | Status: DC
  Administered 2017-10-22 – 2017-10-25 (×4): 40 mg via SUBCUTANEOUS
  Filled 2017-10-22 (×4): qty 0.4

## 2017-10-22 MED ORDER — DIAZEPAM 2 MG PO TABS: 2.0000 mg | ORAL_TABLET | Freq: Two times a day (BID) | ORAL | Status: DC | PRN

## 2017-10-22 MED ORDER — SUMATRIPTAN SUCCINATE 50 MG PO TABS
50.0000 mg | ORAL_TABLET | ORAL | Status: DC | PRN
  Filled 2017-10-22: qty 1

## 2017-10-22 MED ORDER — ASPIRIN EC 325 MG PO TBEC
325.0000 mg | DELAYED_RELEASE_TABLET | Freq: Every day | ORAL | Status: AC
  Administered 2017-10-22: 325 mg via ORAL
  Filled 2017-10-22: qty 1

## 2017-10-22 MED ORDER — FUROSEMIDE 10 MG/ML IJ SOLN
20.0000 mg | Freq: Two times a day (BID) | INTRAMUSCULAR | Status: AC
  Administered 2017-10-22 (×2): 20 mg via INTRAVENOUS
  Filled 2017-10-22 (×2): qty 2

## 2017-10-22 NOTE — Progress Notes (Signed)
CT surgery p.m. Rounds  Patient having soreness but feeling better Vital signs stable Minimal chest tube drainage Ambulated in hall P.m. Labs satisfactory

## 2017-10-22 NOTE — Progress Notes (Signed)
Nice thanks.

## 2017-10-22 NOTE — Progress Notes (Addendum)
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  She has received nausea and pain medication and states she is feeling better.  States she is "tough"  Objective: Vital signs in last 24 hours: Temp:  [98.2 F (36.8 C)-99.5 F (37.5 C)] 99.3 F (37.4 C) (10/03 0700) Pulse Rate:  [58-86] 67 (10/03 0700) Cardiac Rhythm: Atrial paced (10/03 0400) Resp:  [12-23] 19 (10/03 0700) BP: (93-127)/(65-82) 127/70 (10/03 0700) SpO2:  [80 %-100 %] 97 % (10/03 0700) Arterial Line BP: (92-151)/(59-76) 142/69 (10/03 0700) Weight:  [80 kg] 80 kg (10/03 0500)  Filed Weights   10/21/17 0722 10/22/17 0500  Weight: 74.8 kg 80 kg    Weight change:    Hemodynamic parameters for last 24 hours: CVP:  [11 mmHg-23 mmHg] 15 mmHg  Intake/Output from previous day: 10/02 0701 - 10/03 0700 In: 3545.5 [I.V.:2806.4; Blood:257; IV Piggyback:482.1] Out: 3244 [Urine:2350; Blood:600; Chest Tube:540]  Intake/Output this shift: No intake/output data recorded.  Current Meds: Scheduled Meds: . acetaminophen  1,000 mg Oral Q6H  . aspirin EC  325 mg Oral Daily  . [START ON 10/23/2017] aspirin EC  81 mg Oral Daily  . bisacodyl  10 mg Oral Daily   Or  . bisacodyl  10 mg Rectal Daily  . Chlorhexidine Gluconate Cloth  6 each Topical Daily  . docusate sodium  200 mg Oral Daily  . enoxaparin (LOVENOX) injection  40 mg Subcutaneous QHS  . FLUoxetine  40 mg Oral Daily  . insulin aspart  0-20 Units Subcutaneous Q4H  . ketorolac  15 mg Intravenous Q6H  . mouth rinse  15 mL Mouth Rinse BID  . [START ON 10/23/2017]  pantoprazole  40 mg Oral Daily  . sodium chloride flush  10-40 mL Intracatheter Q12H  . sodium chloride flush  3 mL Intravenous Q12H  . warfarin  2.5 mg Oral q1800  . Warfarin - Physician Dosing Inpatient   Does not apply q1800   Continuous Infusions: . sodium chloride    . sodium chloride Stopped (10/21/17 1741)  . albumin human    . cefUROXime (ZINACEF)  IV Stopped (10/22/17 0548)  . lactated ringers    . lactated ringers    . lactated ringers     PRN Meds:.sodium chloride, albumin human, diazepam, fentaNYL (SUBLIMAZE) injection, lactated ringers, metoprolol tartrate, ondansetron (ZOFRAN) IV, oxyCODONE, sodium chloride flush, sodium chloride flush, SUMAtriptan, traMADol  General appearance: alert, cooperative and no distress Heart: regular rate and rhythm Lungs: diminished breath sounds bibasilar Abdomen: soft, non-tender; bowel sounds normal; no masses,  no organomegaly Extremities: edema trace Wound: clean and dry, aquacel in place on Thoracotomy  Lab Results: CBC: Recent Labs    10/21/17 2015 10/22/17 0345  WBC 20.5* 18.2*  HGB 13.0 13.2  HCT 39.2 39.7  PLT 71* 86*   BMET:  Recent Labs    10/19/17 1048  10/21/17 1959 10/21/17 2015 10/22/17 0345  NA 140   < > 139  --  137  K 4.3   < > 4.1  --  4.4  CL 106   < > 109  --  108  CO2 24  --   --   --  24  GLUCOSE 88   < > 162*  --  138*  BUN 11   < > 9  --  9  CREATININE 0.81   < > 0.60 0.67 0.63  CALCIUM 9.2  --   --   --  7.7*   < > = values in this interval not displayed.    CMET: Lab Results  Component Value Date   WBC 18.2 (H) 10/22/2017   HGB 13.2 10/22/2017   HCT 39.7 10/22/2017   PLT 86 (L) 10/22/2017   GLUCOSE 138 (H) 10/22/2017   ALT 17 10/19/2017   AST 22 10/19/2017   NA 137 10/22/2017   K 4.4 10/22/2017   CL 108 10/22/2017   CREATININE 0.63 10/22/2017   BUN 9 10/22/2017   CO2 24 10/22/2017   INR 1.20 10/21/2017   HGBA1C 5.4 10/19/2017      PT/INR:  Recent Labs    10/21/17 1353    LABPROT 15.1  INR 1.20   Radiology: Dg Chest Port 1 View  Result Date: 10/21/2017 CLINICAL DATA:  Atelectasis, follow-up EXAM: PORTABLE CHEST 1 VIEW COMPARISON:  CT chest of 10/19/2017 FINDINGS: Minimal linear atelectasis or scarring remains at the left lung base. No pneumonia or effusion is seen. Right chest tube remains, and no definite pneumothorax is seen. Left IJ central venous line extends to the level of the aortic knob and is in uncertain position. Mild cardiomegaly is stable. IMPRESSION: 1. Linear scarring or atelectasis remains at the left lung base. 2. Right chest tube is present.  No pneumothorax. 3. Left central venous line tip is in on certain position. Correlate clinically. Electronically Signed   By: Ivar Drape M.D.   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 CV- EKG reviewed, Sinus Brady rate in the 60s this morning-hold off of BB for now, can restart as HR increases, HTN off NTG drip 2. Pulm- chest tube output 580 cc since surgery, leave in place today, CXR with atelectasis, encouraged use of IS 3. Renal- creatinine WNL, weight is elevated, may benefit from some gentle IV diuretics, will discuss with Dr. [name] 4. Expected post operative blood loss anemia, mild Hgb is WNL at 13.9 5. Expected thrombocytopenia, 86K will monitor 6. Dispo- patient stable POD #1, BP will likely tolerate diuretics, if okay will start 20 mg IV this morning, leave chest tubes, POD #1 progression orders     Ellwood Handler 10/22/2017 7:32 AM    I have seen and examined the patient and agree with the assessment and plan as outlined.  Will also start Coumadin with plans for 3 months anticoagulation to decrease risk of thrombotic occlusion of SVC.  Rexene Alberts,  MD 10/22/2017 9:00 AM

## 2017-10-22 NOTE — Plan of Care (Signed)
  Problem: Activity: Goal: Risk for activity intolerance will decrease Outcome: Progressing   Problem: Elimination: Goal: Will not experience complications related to urinary retention Outcome: Progressing   Problem: Pain Managment: Goal: General experience of comfort will improve Outcome: Progressing   Problem: Safety: Goal: Ability to remain free from injury will improve Outcome: Progressing   Problem: Skin Integrity: Goal: Risk for impaired skin integrity will decrease Outcome: Progressing   

## 2017-10-22 NOTE — Anesthesia Postprocedure Evaluation (Signed)
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, CLOSURE OF PATENT FORAMEN OVALE (N/A Chest) TRANSESOPHAGEAL ECHOCARDIOGRAM (TEE) (N/A )     Patient location during evaluation: SICU Anesthesia Type: General Level of consciousness: awake and patient cooperative Pain management: pain level controlled Vital Signs Assessment: post-procedure vital signs reviewed and stable Respiratory status: spontaneous breathing and non-rebreather facemask Cardiovascular status: stable Postop Assessment: no apparent nausea or vomiting Anesthetic complications: no Comments: Discussed initiating BiPAP with respiratory therapy and need for aggressive pulmonary toilet    Last Vitals:  Vitals:   10/22/17 1000 10/22/17 1100  BP: 100/73 102/68  Pulse: 69 68  Resp: 16 (!) 33  Temp: 37.3 C 36.9 C  SpO2: 92% 96%    Last Pain:  Vitals:   10/22/17 1057  TempSrc:   PainSc: Asleep                 Ivey Nembhard

## 2017-10-23 ENCOUNTER — Inpatient Hospital Stay (HOSPITAL_COMMUNITY): Payer: BLUE CROSS/BLUE SHIELD

## 2017-10-23 LAB — CBC
HCT: 36.6 % (ref 36.0–46.0)
Hemoglobin: 12 g/dL (ref 12.0–15.0)
MCH: 33.4 pg (ref 26.0–34.0)
MCHC: 32.8 g/dL (ref 30.0–36.0)
MCV: 101.9 fL — ABNORMAL HIGH (ref 78.0–100.0)
PLATELETS: 78 10*3/uL — AB (ref 150–400)
RBC: 3.59 MIL/uL — ABNORMAL LOW (ref 3.87–5.11)
RDW: 13.1 % (ref 11.5–15.5)
WBC: 13.1 10*3/uL — ABNORMAL HIGH (ref 4.0–10.5)

## 2017-10-23 LAB — BASIC METABOLIC PANEL
Anion gap: 5 (ref 5–15)
BUN: 10 mg/dL (ref 6–20)
CALCIUM: 8.2 mg/dL — AB (ref 8.9–10.3)
CO2: 29 mmol/L (ref 22–32)
CREATININE: 0.69 mg/dL (ref 0.44–1.00)
Chloride: 103 mmol/L (ref 98–111)
GFR calc Af Amer: >60 mL/min (ref >60)
Glucose, Bld: 95 mg/dL (ref 70–99)
Potassium: 4.2 mmol/L (ref 3.5–5.1)
Sodium: 137 mmol/L (ref 135–145)

## 2017-10-23 LAB — GLUCOSE, CAPILLARY
GLUCOSE-CAPILLARY: 95 mg/dL (ref 70–99)
Glucose-Capillary: 92 mg/dL (ref 70–99)

## 2017-10-23 LAB — PROTIME-INR
INR: 1.16
PROTHROMBIN TIME: 14.8 s (ref 11.4–15.2)

## 2017-10-23 MED ORDER — SODIUM CHLORIDE 0.9 % IV SOLN: 250.0000 mL | INTRAVENOUS | Status: DC | PRN

## 2017-10-23 MED ORDER — POTASSIUM CHLORIDE CRYS ER 20 MEQ PO TBCR
20.0000 meq | EXTENDED_RELEASE_TABLET | Freq: Every day | ORAL | Status: DC
  Administered 2017-10-23 – 2017-10-26 (×4): 20 meq via ORAL
  Filled 2017-10-23 (×4): qty 1

## 2017-10-23 MED ORDER — METOCLOPRAMIDE HCL 5 MG/ML IJ SOLN
10.0000 mg | Freq: Four times a day (QID) | INTRAMUSCULAR | Status: AC
  Administered 2017-10-23 (×4): 10 mg via INTRAVENOUS
  Filled 2017-10-23 (×4): qty 2

## 2017-10-23 MED ORDER — FUROSEMIDE 10 MG/ML IJ SOLN
40.0000 mg | Freq: Once | INTRAMUSCULAR | Status: AC
  Administered 2017-10-23: 40 mg via INTRAVENOUS
  Filled 2017-10-23: qty 4

## 2017-10-23 MED ORDER — SODIUM CHLORIDE 0.9% FLUSH: 3.0000 mL | INTRAVENOUS | Status: DC | PRN

## 2017-10-23 MED ORDER — MOVING RIGHT ALONG BOOK
Freq: Once | Status: AC
  Administered 2017-10-23: 10:00:00
  Filled 2017-10-23: qty 1

## 2017-10-23 MED ORDER — INSULIN ASPART 100 UNIT/ML ~~LOC~~ SOLN: 0.0000 [IU] | Freq: Three times a day (TID) | SUBCUTANEOUS | Status: DC

## 2017-10-23 MED ORDER — FUROSEMIDE 40 MG PO TABS
40.0000 mg | ORAL_TABLET | Freq: Every day | ORAL | Status: DC
  Administered 2017-10-24: 40 mg via ORAL
  Filled 2017-10-23: qty 1

## 2017-10-23 MED ORDER — METOPROLOL TARTRATE 12.5 MG HALF TABLET
12.5000 mg | ORAL_TABLET | Freq: Two times a day (BID) | ORAL | Status: DC
  Administered 2017-10-23 – 2017-10-26 (×7): 12.5 mg via ORAL
  Filled 2017-10-23 (×7): qty 1

## 2017-10-23 MED ORDER — SODIUM CHLORIDE 0.9% FLUSH
3.0000 mL | Freq: Two times a day (BID) | INTRAVENOUS | Status: DC
  Administered 2017-10-23 – 2017-10-25 (×5): 3 mL via INTRAVENOUS

## 2017-10-23 MED ORDER — POTASSIUM CHLORIDE CRYS ER 10 MEQ PO TBCR: 10.0000 meq | EXTENDED_RELEASE_TABLET | Freq: Every day | ORAL | Status: DC

## 2017-10-23 MED FILL — Sodium Bicarbonate IV Soln 8.4%: INTRAVENOUS | Qty: 50 | Status: AC

## 2017-10-23 MED FILL — Mannitol IV Soln 20%: INTRAVENOUS | Qty: 1000 | Status: AC

## 2017-10-23 MED FILL — Heparin Sodium (Porcine) Inj 1000 Unit/ML: INTRAMUSCULAR | Qty: 10 | Status: AC

## 2017-10-23 MED FILL — Sodium Chloride IV Soln 0.9%: INTRAVENOUS | Qty: 2000 | Status: AC

## 2017-10-23 MED FILL — Electrolyte-R (PH 7.4) Solution: INTRAVENOUS | Qty: 4000 | Status: AC

## 2017-10-23 MED FILL — Lidocaine HCl(Cardiac) IV PF Soln Pref Syr 100 MG/5ML (2%): INTRAVENOUS | Qty: 25 | Status: AC

## 2017-10-23 NOTE — Progress Notes (Addendum)
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  Symptoms relieved with pain medication and anti-emetics.  She did ambulate which she states went ok.  No BM, no passing gas.  Objective: Vital signs in last 24 hours: Temp:  [98.2 F (36.8 C)-99.3 F (37.4 C)] 99.3 F (37.4 C) (10/04 0700) Pulse Rate:  [66-78] 75 (10/04 0700) Cardiac Rhythm: Normal sinus rhythm (10/04 0300) Resp:  [15-33] 18 (10/04 0700) BP: (100-153)/(68-107) 132/88 (10/04 0700) SpO2:  [91 %-97 %] 94 % (10/04 0700) Arterial Line BP: (125-128)/(62-64) 125/62 (10/03 0900) Weight:  [79.2 kg] 79.2 kg (10/04 0500)  Filed Weights   10/21/17 0722 10/22/17 0500 10/23/17 0500  Weight: 74.8 kg 80 kg 79.2 kg    Weight change: 4.357 kg   Intake/Output from previous day: 10/03 0701 - 10/04 0700 In: 746.9 [P.O.:600; IV Piggyback:146.9] Out: 3700 [Urine:3320; Chest Tube:380]  Current Meds: Scheduled Meds: . acetaminophen  1,000 mg Oral Q6H  . aspirin EC  81 mg Oral Daily  . bisacodyl  10 mg Oral Daily   Or  . bisacodyl  10 mg Rectal Daily  . Chlorhexidine Gluconate Cloth  6 each Topical Daily  . docusate sodium  200 mg Oral Daily  . enoxaparin (LOVENOX) injection  40 mg Subcutaneous QHS  . FLUoxetine  40 mg Oral Daily  . furosemide  40 mg Intravenous Once  . insulin aspart  0-20 Units Subcutaneous Q4H  . metoCLOPramide (REGLAN) injection  10 mg Intravenous Q6H  . pantoprazole  40 mg Oral Daily  . potassium chloride  10 mEq Oral Daily  . sodium chloride flush  10-40 mL Intracatheter Q12H   . sodium chloride flush  3 mL Intravenous Q12H  . warfarin  2.5 mg Oral q1800  . Warfarin - Physician Dosing Inpatient   Does not apply q1800   Continuous Infusions: . sodium chloride 250 mL (10/22/17 1706)  . sodium chloride Stopped (10/21/17 1741)  . lactated ringers    . lactated ringers    . lactated ringers     PRN Meds:.sodium chloride, diazepam, fentaNYL (SUBLIMAZE) injection, lactated ringers, metoprolol tartrate, ondansetron (ZOFRAN) IV, oxyCODONE, sodium chloride flush, sodium chloride flush, SUMAtriptan, traMADol  General appearance: alert, cooperative and no distress Heart: regular rate and rhythm Lungs: diminished breath sounds bibasilar Abdomen: soft, non-tender; bowel sounds normal; no masses,  no organomegaly Extremities: edema trace Wound: clean and dry R Groin, aquacel remains in place on right thoracotomy  Lab Results: CBC: Recent Labs    10/22/17 1700 10/22/17 1715 10/23/17 0409  WBC 14.5*  --  13.1*  HGB 11.9* 11.6* 12.0  HCT 36.5 34.0* 36.6  PLT 71*  --  78*   BMET:  Recent Labs    10/22/17 0345  10/22/17 1715 10/23/17 0409  NA 137  --  138 137  K 4.4  --  3.8 4.2  CL 108  --  101 103  CO2 24  --   --  29  GLUCOSE 138*  --  101* 95  BUN 9  --  9 10  CREATININE 0.63   < > 0.70 0.69  CALCIUM 7.7*  --   --  8.2*   < > = values in this interval not displayed.    CMET: Lab Results  Component Value Date   WBC 13.1 (H) 10/23/2017   HGB 12.0 10/23/2017   HCT 36.6 10/23/2017   PLT 78 (L) 10/23/2017   GLUCOSE 95 10/23/2017   ALT 17 10/19/2017   AST 22 10/19/2017   NA 137 10/23/2017   K 4.2 10/23/2017   CL 103 10/23/2017   CREATININE 0.69 10/23/2017   BUN 10 10/23/2017   CO2 29 10/23/2017   INR 1.16 10/23/2017   HGBA1C 5.4 10/19/2017      PT/INR:  Recent Labs    10/23/17 0409  LABPROT 14.8  INR 1.16   Radiology: No results found.   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 CV- NSR, BP elevated at times- will start low dose Lopressor, continue coumadin at 2.5 mg daily for now... If no arrhythmias today will plan to remove EPW in AM, as patient is taking coumadin 2. Pulm- chest tubes with 320 cc output (level at 900) yesterday, CXR with continued atelectasis, small left pleural effusion- leave chest tubes in place, add flutter valve 3. Renal- creatinine WNL, weight remains about 10 lbs above baseline, K is WNL, will give IV Lasix 40 mg today 4. GI- persistent nausea, does respond to anti-emetics, good BS on exam, however per patient is not passing gas yet, will give 4 doses of reglan 5. Expected post operative thrombocytopenia, minimal improvement, monitor, if platelet count doesn't improve will need to stop Lovenox 6. Dispo- patient stable, start low dose BB today, continue coumadin, IV Lasix 40 mg this morning, will d/c foley this afternoon, watch GI function, platelet count, can likely transfer to step down today   Erin Barrett 10/23/2017 7:32 AM   I have seen and examined the patient and agree with the assessment and plan as outlined.  Mobilize.  Diuresis.  Start low dose metoprolol.  Leave chest tubes in 1 more day.  Transfer step down.  Rexene Alberts, MD 10/23/2017 7:55 AM

## 2017-10-23 NOTE — Significant Event (Signed)
Patient transferring to 4E10, via wheelchair with charge RN Nira Conn. Report has been given. RN called patient's mother-no answered; will try calling again. All personal belongings (cards and balloons) taken to new room. VS stable prior to transfer.    Kristy Lewis

## 2017-10-23 NOTE — Progress Notes (Signed)
CARDIAC REHAB PHASE I   PRE:  Rate/Rhythm: 78 first deg    BP: sitting 121/82    SaO2: 95 RA  MODE:  Ambulation: 350 ft   POST:  Rate/Rhythm: 86 first deg    BP: sitting 131/87     SaO2: 96 RA  Pt asleep upon arrival. Awoke more and willing to walk. Did IS before walking and only 125 mL. Quite congested but not breaking up. Pt able to walk very slowly with RW. If she walked too long or fast, she felt overwhelmingly tired. Several rests. Her congested did start to break up toward end of walk. She coughed after walking and then was able to do 250 mL on IS. Return to bed. Encouraged x4 walks a day and IS/flutter and recliner. Mother with her. Asked for pain meds. [place], ACSM 10/23/2017 2:44 PM

## 2017-10-23 NOTE — Discharge Summary (Signed)
Physician Discharge Summary  Patient ID: Kristy Lewis MRN: [id number] DOB/AGE: 57/04/1960 57 y.o.  Admit date: 10/21/2017 Discharge date: 10/26/2017  Admission Diagnoses:  Patient Active Problem List   Diagnosis Date Noted  . ASD (atrial septal defect), sinus venosus defect   . Chest pain   . Right ventricular enlargement   . Rectal bleeding 09/15/2011   Discharge Diagnoses:   Patient Active Problem List   Diagnosis Date Noted  . 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 ASD (atrial septal defect), sinus venosus defect   . Chest pain   . Right ventricular enlargement   . Rectal bleeding 09/15/2011   Discharged Condition: good  History of Present Illness:  Kristy Lewis is a 57 year-old female with history of atypical chest pain and migraine headaches referred for surgical consultation to discuss treatment options for management of sinus venosus atrial septal defect.  The patient describes a several year history of symptoms of substernal chest pain that occur both with activity and at rest. Symptoms are described as a dull pressure-like discomfort across the chest that is typically exacerbated by physical activity and alleviated by rest but occasionally occurs with minimal activity or at rest. Symptoms are sometimes associated with shortness of breath. Patient has developed progressive fatigue and shortness of breath with more strenuous exertion. Kristy Lewis was initially evaluated by Dr. Jerrell Mylar than a year ago where Kristy Lewis underwent echocardiogram and stress echocardiogram. Echocardiogram performed August 29, 2016 revealed normal left ventricular size and systolic function. There was septal flattening suggesting right ventricular pressure overload in the right ventricle was moderately dilated. The right atrium was mildly dilated. There were no significant valvular abnormalities. Stress  echocardiography was performed and although the patient only reached 74% of target heart rate there was felt to be no evidence for ischemia. The patient subsequently underwent nuclear medicine stress test that was felt to be low risk with ejection fraction calculated 69%. The patient was initially lost to follow-up for a period of time, but Kristy Lewis returned earlier this summer with worsening symptoms. Kristy Lewis underwent left and right heart catheterization by Dr. Tamala Julian on August 05, 2017. Kristy Lewis was found to have normal coronary arteries with normal left ventricular size and systolic function. Patient was noted to have a large left to right shunt at the atrial level with Qp/Qs measured greater than 2.7 to1. Cardiac gated CT angiogram of the heart was recommended to evaluate possible congenital heart disease such as anomalous pulmonary venous return and/or atrial septal defect. The patient subsequently underwent cardiac gated CTA but it was ordered and performed as a coronary study and reported normal. Study was reportedly subsequently reviewed and a sinus venosus atrial septal defect discovered. The patient was referred for surgical consultation.  Kristy Lewis was evaluated by Dr. [name] who felt the patient would benefit from surgical intervention.  The risks and benefits of the procedure were explained to the patient and Kristy Lewis was agreeable to proceed.  Hospital Course:   Kristy Lewis presented to [facility] on 10[facility]21/2017.  Kristy Lewis was taken to the operating room and underwent Minimally Invasive Repair of Sinus Venosus Atrial Septal Defect and Closure of Patent Foramen Ovale.  Kristy Lewis tolerated the procedure without difficulty and was taken to the SICU in stable condition.  Kristy Lewis was extubated the evening of surgery.  During her stay in the SICU the patient was weaned off NTG drip.  Kristy Lewis was started on low dose Coumadin for decreased  risk of thrombotic occlusion of the SVC.  Kristy Lewis was started on gentle diuresis from mild  hypervolemia.  Kristy Lewis was mildly hypertensive and was started on low dose BB for control.   Kristy Lewis was maintaining NSR and was transferred to the step down unit on 10/23/2017.  Kristy Lewis continued to make progress.  Kristy Lewis remains in NSR and her pacing wires were removed on 10/05. Chest tube output decreased and they were also removed on 10/05.  Kristy Lewis has been tolerating a diet and has had a bowel movement. Kristy Lewis is ambulating on room air. Her wound is clean and dry and there is no sign of infection. Kristy Lewis remains on Coumadin at 5 mg daily.  Her most recent INR is 1.16 with a goal of 2.0.  Kristy Lewis continues to ambulate independently.  Her incision is healing without evidence of infection.  Kristy Lewis is felt to be medically stable for discharge home today.  Significant Diagnostic Studies: radiology: Cardiac Morphology   1.  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  Anomalous RSPV draining into the SVC  3.  Severe RA/RV enlargement  4. Normal right dominant coronary arteries with no calcium seen better on CT 08/26/17  Treatments: surgery:    Minimally-Invasive Repair of Sinus Venosus Atrial Septal Defect             Double patch repair including autologous pericardial patch closure of ASD and correction of psrtial anomalous pulmonary venous return             Bovine pericardial patch closure of superior vena cava             Closure of patent foramen ovale  Discharge Exam: Blood pressure 104/76, pulse 74, temperature 98 F (36.7 C), temperature source Oral, resp. rate 18, height 5\' 6"  (1.676 m), weight 74.2 kg, SpO2 94 %. Cardiovascular: RRR, no murmur Pulmonary: Slightly diminished at bases R>L Abdomen: Soft, non tender, bowel sounds present. Extremities: Trace bilateral lower extremity edema. Wounds: Clean and dry.  Disposition: Home  Discharge Medications:  The patient has been discharged on:   1.Beta Blocker:  Yes [ x  ]                              No   [   ]                              If No,  reason:  2.Ace Inhibitor/ARB: Yes [   ]                                     No  [   x ]                                     If No, reason: labile BP  3.Statin:   Yes [  ]                  No  [ x  ]                  If No, reason: no CAD  4.Shela CommonsVelta Addison  [ x  ]  No   [   ]                  If No, reason:     Discharge Instructions    Amb Referral to Cardiac Rehabilitation   Complete by:  As directed    Diagnosis:  Other     Allergies as of 10/26/2017      Reactions   Iodine Swelling, Rash   SWELLING REACTION UNSPECIFIED    Morphine And Related Itching, Other (See Comments)   Altered mental status "mean"   Shellfish Allergy Swelling, Rash   SWELLING REACTION UNSPECIFIED       Medication List    TAKE these medications   acetaminophen 500 MG tablet Commonly known as:  TYLENOL Take 2 tablets (1,000 mg total) by mouth every 6 (six) hours as needed.   aspirin 81 MG EC tablet Take 1 tablet (81 mg total) by mouth daily.   cyclobenzaprine 5 MG tablet Commonly known as:  FLEXERIL Take 1 tablet (5 mg total) by mouth 3 (three) times daily as needed for muscle spasms.   diazepam 2 MG tablet Commonly known as:  VALIUM Take 2 mg by mouth 2 (two) times daily as needed for anxiety.   diphenhydrAMINE 25 mg capsule Commonly known as:  BENADRYL Take 25 mg by mouth at bedtime as needed for allergies or sleep.   FLUoxetine 40 MG capsule Commonly known as:  PROZAC Take 40 mg by mouth daily.   furosemide 40 MG tablet Commonly known as:  LASIX Take 1 tablet (40 mg total) by mouth daily.   metoprolol tartrate 25 MG tablet Commonly known as:  LOPRESSOR Take 0.5 tablets (12.5 mg total) by mouth 2 (two) times daily.   nitroGLYCERIN 0.4 MG SL tablet Commonly known as:  NITROSTAT Place 1 tablet (0.4 mg total) under the tongue every 5 (five) minutes as needed for chest pain.   potassium chloride SA 20 MEQ tablet Commonly known as:  K-DUR,KLOR-CON Take 1 tablet  (20 mEq total) by mouth daily.   SUMAtriptan 50 MG tablet Commonly known as:  IMITREX Take 50 mg by mouth every 2 (two) hours as needed for migraine. May repeat in 2 hours if headache persists or recurs.   traMADol 50 MG tablet Commonly known as:  ULTRAM Take 2 tablets (100 mg total) by mouth every 4 (four) hours as needed for moderate pain.   VITAMIN C PO Take 1 tablet by mouth daily.   warfarin 5 MG tablet Commonly known as:  COUMADIN Take 1 tablet (5 mg total) by mouth daily at 6 PM.      Follow-up Information    Rexene Alberts, MD Follow up on 11/09/2017.   Specialty:  Cardiothoracic Surgery Why:  Appointment is at 3:30, please get CXR at [facility] at 3:00 located on first floor of our office building Contact information: [place] [address] [phone]        Almyra Deforest, [place] Follow up on 11/04/2017.   Specialties:  Cardiology, Radiology Why:  Appointment is at 2:00 Contact information: [address] [place] [address] [phone]        Herminio Commons, MD Follow up on 10/28/2017.   Specialty:  Cardiology Why:  Appointment is at 2:30 for PT/INR check Contact information: [place] [address] [phone]           Signed: Ellwood Handler 10/26/2017, 7:46 AM

## 2017-10-24 ENCOUNTER — Inpatient Hospital Stay (HOSPITAL_COMMUNITY): Payer: BLUE CROSS/BLUE SHIELD

## 2017-10-24 LAB — BASIC METABOLIC PANEL
Anion gap: 9 (ref 5–15)
BUN: 10 mg/dL (ref 6–20)
CALCIUM: 8.6 mg/dL — AB (ref 8.9–10.3)
CO2: 27 mmol/L (ref 22–32)
Chloride: 101 mmol/L (ref 98–111)
Creatinine, Ser: 0.71 mg/dL (ref 0.44–1.00)
GFR calc Af Amer: >60 mL/min (ref >60)
Glucose, Bld: 108 mg/dL — ABNORMAL HIGH (ref 70–99)
Potassium: 3.6 mmol/L (ref 3.5–5.1)
Sodium: 137 mmol/L (ref 135–145)

## 2017-10-24 LAB — PROTIME-INR
INR: 1.15
Prothrombin Time: 14.6 seconds (ref 11.4–15.2)

## 2017-10-24 LAB — CBC
HCT: 36.8 % (ref 36.0–46.0)
Hemoglobin: 12.3 g/dL (ref 12.0–15.0)
MCH: 33.4 pg (ref 26.0–34.0)
MCHC: 33.4 g/dL (ref 30.0–36.0)
MCV: 100 fL (ref 78.0–100.0)
PLATELETS: 96 10*3/uL — AB (ref 150–400)
RBC: 3.68 MIL/uL — AB (ref 3.87–5.11)
RDW: 13.1 % (ref 11.5–15.5)
WBC: 11.9 10*3/uL — ABNORMAL HIGH (ref 4.0–10.5)

## 2017-10-24 MED ORDER — POTASSIUM CHLORIDE CRYS ER 20 MEQ PO TBCR
40.0000 meq | EXTENDED_RELEASE_TABLET | Freq: Once | ORAL | Status: AC
  Administered 2017-10-24: 40 meq via ORAL
  Filled 2017-10-24: qty 2

## 2017-10-24 NOTE — Progress Notes (Addendum)
      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 She denies nausea this am. She has had a bowel movement. She is emotional this am so I spent some time talking with her and reassuring her that everyday, things will slowly improve.  Objective: Vital signs in last 24 hours: Temp:  [98 F (36.7 C)-99.1 F (37.3 C)] 98.6 F (37 C) (10/05 0729) Pulse Rate:  [73-84] 84 (10/05 0729) Cardiac Rhythm: Normal sinus rhythm;Heart block (10/04 1900) Resp:  [16-29] 21 (10/05 0729) BP: (112-140)/(78-94) 124/91 (10/05 0729) SpO2:  [92 %-97 %] 97 % (10/05 0729) Weight:  [75.9 kg] 75.9 kg (10/05 0245)  Pre op weight 74.8 kg Current Weight  10/24/17 75.9 kg       Intake/Output from previous day: 10/04 0701 - 10/05 0700 In: 120 [P.O.:120] Out: 1530 [Urine:1390; Chest Tube:140]   Physical Exam:  Cardiovascular: RRR, no murmur Pulmonary: Slightly diminished at bases R>L Abdomen: Soft, non tender, bowel sounds present. Extremities: Trace bilateral lower extremity edema. Wounds: Aquacel intact.  Lab Results: CBC: Recent Labs    10/23/17 0409 10/24/17 0315  WBC 13.1* 11.9*  HGB 12.0 12.3  HCT 36.6 36.8  PLT 78* 96*   BMET:  Recent Labs    10/23/17 0409 10/24/17 0315  NA 137 137  K 4.2 3.6  CL 103 101  CO2 29 27  GLUCOSE 95 108*  BUN 10 10  CREATININE 0.69 0.71  CALCIUM 8.2* 8.6*    PT/INR:  Lab Results  Component Value Date   INR 1.15 10/24/2017   INR 1.16 10/23/2017   INR 1.20 10/21/2017   ABG:  INR: Will add last result for INR, ABG once components are confirmed Will add last 4 CBG results once components are  confirmed  Assessment/Plan:  1. CV - SR, first degree heart block. On Lopressor 12.5 mg bid and Coumadin. INR 1.15-may need to increase Coumadin if INR not increased in am. 2.  Pulmonary - Chest tubes with 140 cc of output last 24 hours. CXR appears stable. Will remove epicardial pacing wires then chest tubes. Check CXR in am. Encourage incentive spirometer. 3. Volume Overload - On Lasix 40 mg daily 4. GI-has persistent nausea and anti emetics do not help;was given several doses of Reglan, which did help 6. Thrombocytopenia-platelets increased to 95,000 this am 7. Supplement potassium  Donielle M ZimmermanPA-C 10/24/2017,7:40 AM [phone]   Chart reviewed, patient examined, agree with above. Chest tubes are out now and she feels much better.

## 2017-10-24 NOTE — Progress Notes (Addendum)
CARDIAC REHAB PHASE I   PRE:  Rate/Rhythm: 80  BP:  Sitting: 139/86     SaO2: 95ra  MODE:  Ambulation: 250 ft   POST:  Rate/Rhythm: 84  BP:  Sitting: 132/89     SaO2: 98ra  10:29a-11:00a Patient ambulated with x1 assist and walker. Complained of overall fatigue and sob. Needed one rest break. Long walk this AM. Encouraged one more walk today. Patient in chair with call bell in reach. Referral placed for Cardiac Rehab at [facility].  [place], [place] 10/24/2017 11:00 AM

## 2017-10-24 NOTE — Progress Notes (Signed)
Pt ambulated this AM about 940 ft. Room air, rolling walker, standby assist. Pt tolerated well, assisted to bathroom and back to bed.  Jaymes Graff, RN

## 2017-10-25 ENCOUNTER — Inpatient Hospital Stay (HOSPITAL_COMMUNITY): Payer: BLUE CROSS/BLUE SHIELD

## 2017-10-25 LAB — PROTIME-INR
INR: 1.12
Prothrombin Time: 14.3 seconds (ref 11.4–15.2)

## 2017-10-25 MED ORDER — FUROSEMIDE 10 MG/ML IJ SOLN
40.0000 mg | Freq: Once | INTRAMUSCULAR | Status: AC
  Administered 2017-10-25: 40 mg via INTRAVENOUS
  Filled 2017-10-25: qty 4

## 2017-10-25 MED ORDER — WARFARIN SODIUM 5 MG PO TABS
5.0000 mg | ORAL_TABLET | Freq: Every day | ORAL | Status: DC
  Administered 2017-10-25: 5 mg via ORAL
  Filled 2017-10-25: qty 1

## 2017-10-25 MED ORDER — FUROSEMIDE 40 MG PO TABS
40.0000 mg | ORAL_TABLET | Freq: Every day | ORAL | Status: DC
  Administered 2017-10-26: 40 mg via ORAL
  Filled 2017-10-25: qty 1

## 2017-10-25 NOTE — Discharge Instructions (Signed)
Discharge Instructions:  1. You may shower, please wash incisions daily with soap and water and keep dry.  If you wish to cover wounds with dressing you may do so but please keep clean and change daily.  No tub baths or swimming until incisions have completely healed.  If your incisions become red or develop any drainage please call our office at [phone]  2. No Driving until cleared by Dr. Guy Sandifer office and you are no longer using narcotic pain medications  3. Monitor your weight daily.. Please use the same scale and weigh at same time... If you gain 5-10 lbs in 48 hours with associated lower extremity swelling, please contact our office at [phone]  4. Fever of 101.5 for at least 24 hours with no source, please contact our office at [phone]  5. Activity- up as tolerated, please walk at least 3 times per day.  Avoid strenuous activity for several weeks.  Do not lift right arm above your head until cleared by Dr. Guy Sandifer office  6. If any questions or concerns arise, please do not hesitate to contact our office at [phone]   Vitamin K Foods and Warfarin Warfarin is a blood thinner (anticoagulant). Anticoagulant medicines help prevent the formation of blood clots. These medicines work by decreasing the activity of vitamin K, which promotes normal blood clotting. When you take warfarin, problems can occur from suddenly increasing or decreasing the amount of vitamin K that you eat from one day to the next. Problems may include:  Blood clots.  Bleeding.  What general guidelines do I need to follow? To avoid problems when taking warfarin:  Eat a balanced diet that includes: ? Fresh fruits and vegetables. ? Whole grains. ? Low-fat dairy products. ? Lean proteins, such as fish, eggs, and lean cuts of meat.  Keep your intake of vitamin K consistent from day to day. To do this: ? Avoid eating large amounts of vitamin K one day and low amounts of vitamin K the next day. ? If  you take a multivitamin that contains vitamin K, be sure to take it every day. ? Know which foods contain vitamin K. Use the lists below to understand serving sizes and the amount of vitamin K in one serving.  Avoid major changes in your diet. If you are going to change your diet, talk with your health care provider before making changes.  Work with a Financial planner (dietitian) to develop a meal plan that works best for you.  High vitamin K foods Foods that are high in vitamin K contain more than 100 mcg (micrograms) per serving. These include:  Broccoli (cooked) -  cup has 110 mcg.  Brussels sprouts (cooked) -  cup has 109 mcg.  Greens, beet (cooked) -  cup has 350 mcg.  Greens, collard (cooked) -  cup has 418 mcg.  Greens, turnip (cooked) -  cup has 265 mcg.  Green onions or scallions -  cup has 105 mcg.  Kale (fresh or frozen) -  cup has 531 mcg.  Parsley (raw) - 10 sprigs has 164 mcg.  Spinach (cooked) -  cup has 444 mcg.  Swiss chard (cooked) -  cup has 287 mcg.  Moderate vitamin K foods Foods that have a moderate amount of vitamin K contain 25-100 mcg per serving. These include:  Asparagus (cooked) - 5 spears have 38 mcg.  Black-eyed peas (dried) -  cup has 32 mcg.  Cabbage (cooked) -  cup has 37 mcg.  Kiwi fruit -  1 medium has 31 mcg.  Lettuce - 1 cup has 57-63 mcg.  Okra (frozen) -  cup has 44 mcg.  Prunes (dried) - 5 prunes have 25 mcg.  Watercress (raw) - 1 cup has 85 mcg.  Low vitamin K foods Foods low in vitamin K contain less than 25 mcg per serving. These include:  Artichoke - 1 medium has 18 mcg.  Avocado - 1 oz. has 6 mcg.  Blueberries -  cup has 14 mcg.  Cabbage (raw) -  cup has 21 mcg.  Carrots (cooked) -  cup has 11 mcg.  Cauliflower (raw) -  cup has 11 mcg.  Cucumber with peel (raw) -  cup has 9 mcg.  Grapes -  cup has 12 mcg.  Mango - 1 medium has 9 mcg.  Nuts - 1 oz. has 15 mcg.  Pear - 1 medium has  8 mcg.  Peas (cooked) -  cup has 19 mcg.  Pickles - 1 spear has 14 mcg.  Pumpkin seeds - 1 oz. has 13 mcg.  Sauerkraut (canned) -  cup has 16 mcg.  Soybeans (cooked) -  cup has 16 mcg.  Tomato (raw) - 1 medium has 10 mcg.  Tomato sauce -  cup has 17 mcg.  Vitamin K-free foods If a food contain less than 5 mcg per serving, it is considered to have no vitamin K. These foods include:  Bread and cereal products.  Cheese.  Eggs.  Fish and shellfish.  Meat and poultry.  Milk and dairy products.  Sunflower seeds.  Actual amounts of vitamin K in foods may be different depending on processing. Talk with your dietitian about what foods you can eat and what foods you should avoid. This information is not intended to replace advice given to you by your health care provider. Make sure you discuss any questions you have with your health care provider. Document Released: 11/03/2008 Document Revised: 07/29/2015 Document Reviewed: 04/11/2015 Elsevier Interactive Patient Education  2018 [place] on my medicine - Coumadin   (Warfarin)  This medication education was reviewed with me or my healthcare representative as part of my discharge preparation.  The pharmacist that spoke with me during my hospital stay was:  Einar Grad, [facility]  Why was Coumadin prescribed for you? Coumadin was prescribed for you because you have a blood clot or a medical condition that can cause an increased risk of forming blood clots. Blood clots can cause serious health problems by blocking the flow of blood to the heart, lung, or brain. Coumadin can prevent harmful blood clots from forming. As a reminder your indication for Coumadin is:   Blood Clot Prevention After Heart Valve Surgery  What test will check on my response to Coumadin? While on Coumadin (warfarin) you will need to have an INR test regularly to ensure that your dose is keeping you in the desired range. The INR  (international normalized ratio) number is calculated from the result of the laboratory test called prothrombin time (PT).  If an INR APPOINTMENT HAS NOT ALREADY BEEN MADE FOR YOU please schedule an appointment to have this lab work done by your health care provider within 7 days. Your INR goal is usually a number between:  2 to 3 or your provider may give you a more narrow range like 2-2.5.  Ask your health care provider during an office visit what your goal INR is.  What  do you need to  know  About  COUMADIN? Take Coumadin (warfarin) exactly as prescribed by your healthcare provider about the same time each day.  DO NOT stop taking without talking to the doctor who prescribed the medication.  Stopping without other blood clot prevention medication to take the place of Coumadin may increase your risk of developing a new clot or stroke.  Get refills before you run out.  What do you do if you miss a dose? If you miss a dose, take it as soon as you remember on the same day then continue your regularly scheduled regimen the next day.  Do not take two doses of Coumadin at the same time.  Important Safety Information A possible side effect of Coumadin (Warfarin) is an increased risk of bleeding. You should call your healthcare provider right away if you experience any of the following: ? Bleeding from an injury or your nose that does not stop. ? Unusual colored urine (red or dark brown) or unusual colored stools (red or black). ? Unusual bruising for unknown reasons. ? A serious fall or if you hit your head (even if there is no bleeding).  Some foods or medicines interact with Coumadin (warfarin) and might alter your response to warfarin. To help avoid this: ? Eat a balanced diet, maintaining a consistent amount of Vitamin K. ? Notify your provider about major diet changes you plan to make. ? Avoid alcohol or limit your intake to 1 drink for women and 2 drinks for men per day. (1 drink is 5 oz.  wine, 12 oz. beer, or 1.5 oz. liquor.)  Make sure that ANY health care provider who prescribes medication for you knows that you are taking Coumadin (warfarin).  Also make sure the healthcare provider who is monitoring your Coumadin knows when you have started a new medication including herbals and non-prescription products.  Coumadin (Warfarin)  Major Drug Interactions  Increased Warfarin Effect Decreased Warfarin Effect  Alcohol (large quantities) Antibiotics (esp. Septra/Bactrim, Flagyl, Cipro) Amiodarone (Cordarone) Aspirin (ASA) Cimetidine (Tagamet) Megestrol (Megace) NSAIDs (ibuprofen, naproxen, etc.) Piroxicam (Feldene) Propafenone (Rythmol SR) Propranolol (Inderal) Isoniazid (INH) Posaconazole (Noxafil) Barbiturates (Phenobarbital) Carbamazepine (Tegretol) Chlordiazepoxide (Librium) Cholestyramine (Questran) Griseofulvin Oral Contraceptives Rifampin Sucralfate (Carafate) Vitamin K   Coumadin (Warfarin) Major Herbal Interactions  Increased Warfarin Effect Decreased Warfarin Effect  Garlic Ginseng Ginkgo biloba Coenzyme Q10 Green tea St. Johns wort    Coumadin (Warfarin) FOOD Interactions  Eat a consistent number of servings per week of foods HIGH in Vitamin K (1 serving =  cup)  Collards (cooked, or boiled & drained) Kale (cooked, or boiled & drained) Mustard greens (cooked, or boiled & drained) Parsley *serving size only =  cup Spinach (cooked, or boiled & drained) Swiss chard (cooked, or boiled & drained) Turnip greens (cooked, or boiled & drained)  Eat a consistent number of servings per week of foods MEDIUM-HIGH in Vitamin K (1 serving = 1 cup)  Asparagus (cooked, or boiled & drained) Broccoli (cooked, boiled & drained, or raw & chopped) Brussel sprouts (cooked, or boiled & drained) *serving size only =  cup Lettuce, raw (green leaf, endive, romaine) Spinach, raw Turnip greens, raw & chopped   These websites have more information on Coumadin  (warfarin):  FailFactory.se; VeganReport.com.au;

## 2017-10-25 NOTE — Progress Notes (Signed)
Patient ambulated in hallway with walker. Will monitor patient. Jaina Morin Jessup RN  

## 2017-10-25 NOTE — Progress Notes (Signed)
Patient ambulated in  [place] with family and walker. Sharne Linders, Bettina Gavia RN

## 2017-10-25 NOTE — Progress Notes (Addendum)
      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 But she is having a better day today than yesterday.  Her brother drove down from [place] yesterday and that helped brighten her mood as well.  Objective: Vital signs in last 24 hours: Temp:  [98 F (36.7 C)-98.5 F (36.9 C)] 98 F (36.7 C) (10/06 0305) Pulse Rate:  [74-84] 74 (10/06 0305) Cardiac Rhythm: Normal sinus rhythm (10/06 0342) Resp:  [18-27] 18 (10/06 0305) BP: (109-141)/(71-92) 118/80 (10/06 0305) SpO2:  [91 %-96 %] 91 % (10/06 0305) Weight:  [74.4 kg] 74.4 kg (10/06 0500)  Pre op weight 74.8 kg Current Weight  10/25/17 74.4 kg       Intake/Output from previous day: 10/05 0701 - 10/06 0700 In: 120 [P.O.:120] Out: -    Physical Exam:  Cardiovascular: RRR, no murmur Pulmonary: Slightly diminished at bases R>L Abdomen: Soft, non tender, bowel sounds present. Extremities: Trace bilateral lower extremity edema. Wounds: Clean and dry.  Lab Results: CBC: Recent Labs    10/23/17 0409 10/24/17 0315  WBC 13.1* 11.9*  HGB 12.0 12.3  HCT 36.6 36.8  PLT 78* 96*   BMET:  Recent Labs    10/23/17 0409 10/24/17 0315  NA 137 137  K 4.2 3.6  CL 103 101  CO2 29 27  GLUCOSE 95 108*  BUN 10 10  CREATININE 0.69 0.71  CALCIUM 8.2* 8.6*    PT/INR:  Lab Results  Component Value Date   INR 1.12 10/25/2017   INR 1.15 10/24/2017   INR 1.16 10/23/2017   ABG:  INR: Will add last result for INR, ABG once components are confirmed Will add last 4 CBG results once components are confirmed  Assessment/Plan:  1. CV - SR, first degree  heart block. On Lopressor 12.5 mg bid and Coumadin. INR slightly decreased to 1.12 so will increase Coumadin to 5 mg. 2.  Pulmonary - On room air. CXR this am shows no pneumothorax, bibasilar atelectasis, right pleural effusion. Encourage incentive spirometer. 3. Volume Overload - On Lasix 40 mg daily. Will give IV this am. 4. Thrombocytopenia-last platelets increased to 95,000 this am 5. Likely discharge in am  Sharalyn Ink ZimmermanPA-C 10/25/2017,7:38 AM [phone]   Chart reviewed, patient examined, agree with above. She is progressing well Coumadin increased to 5 mg since INR has not bumped. Weight down to preop but still has mild leg edema and small right pleural effusion. Continue diuresis today.

## 2017-10-25 NOTE — Progress Notes (Signed)
Patient with small nose bleed this AM. Patient held pressure on nose and bleeding stopped. Will monitor patient. Dessiree Sze, Bettina Gavia RN

## 2017-10-26 LAB — PROTIME-INR
INR: 1.16
PROTHROMBIN TIME: 14.7 s (ref 11.4–15.2)

## 2017-10-26 MED ORDER — FUROSEMIDE 40 MG PO TABS: 40.0000 mg | ORAL_TABLET | Freq: Every day | ORAL | 0 refills | Status: DC

## 2017-10-26 MED ORDER — POTASSIUM CHLORIDE CRYS ER 20 MEQ PO TBCR: 20.0000 meq | EXTENDED_RELEASE_TABLET | Freq: Every day | ORAL | 0 refills | Status: DC

## 2017-10-26 MED ORDER — ASPIRIN 81 MG PO TBEC: 81.0000 mg | DELAYED_RELEASE_TABLET | Freq: Every day | ORAL | Status: DC

## 2017-10-26 MED ORDER — TRAMADOL HCL 50 MG PO TABS: 100.0000 mg | ORAL_TABLET | ORAL | 0 refills | Status: DC | PRN

## 2017-10-26 MED ORDER — CYCLOBENZAPRINE HCL 5 MG PO TABS: 5.0000 mg | ORAL_TABLET | Freq: Three times a day (TID) | ORAL | 0 refills | Status: DC | PRN

## 2017-10-26 MED ORDER — ACETAMINOPHEN 500 MG PO TABS: 1000.0000 mg | ORAL_TABLET | Freq: Four times a day (QID) | ORAL | 0 refills | Status: AC | PRN

## 2017-10-26 MED ORDER — WARFARIN SODIUM 5 MG PO TABS: 5.0000 mg | ORAL_TABLET | Freq: Every day | ORAL | 3 refills | Status: DC

## 2017-10-26 MED ORDER — METOPROLOL TARTRATE 25 MG PO TABS: 12.5000 mg | ORAL_TABLET | Freq: Two times a day (BID) | ORAL | 3 refills | Status: DC

## 2017-10-26 NOTE — Progress Notes (Signed)
CARDIAC REHAB PHASE I   Offered to walk with pt, pt declining stating she was walking in hallway independently this am without a walker. Pt overwhelmed, states she has never been sick before and is used to taking care of others. Talked with pt about importance of taking care of herself, and ways to reduce stress. Reviewed d/c education with pt. Encouraged pt to take it a day at a time. Pt states she feels good about her plan, and is anxious to go home. Pt referred to [facility] [place].  [phone] Kristy Falco, RN BSN 10/26/2017 9:24 AM

## 2017-10-26 NOTE — Progress Notes (Addendum)
      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  She feels like she will be more comfortable at home.  + ambulation  + BM  Objective: Vital signs in last 24 hours: Temp:  [97.9 F (36.6 C)-98.1 F (36.7 C)] 98 F (36.7 C) (10/07 0335) Pulse Rate:  [74-91] 74 (10/07 0335) Cardiac Rhythm: Normal sinus rhythm (10/07 0701) Resp:  [13-22] 18 (10/07 0335) BP: (104-128)/(75-95) 104/76 (10/07 0335) SpO2:  [94 %-100 %] 94 % (10/07 0335) Weight:  [74.2 kg] 74.2 kg (10/07 0605)  Intake/Output from previous day: 10/06 0701 - 10/07 0700 In: 1560 [P.O.:1560] Out: 1000 [Urine:1000]  General appearance: alert, cooperative and no distress Heart: regular rate and rhythm Lungs: diminished breath sounds bibasilar Abdomen: soft, non-tender; bowel sounds normal; no masses,  no organomegaly Extremities: edema trace Wound: clean and dry  Lab Results: Recent Labs    10/24/17 0315  WBC 11.9*  HGB 12.3  HCT 36.8  PLT 96*   BMET:  Recent Labs    10/24/17 0315  NA 137  K 3.6  CL 101  CO2 27  GLUCOSE 108*  BUN 10  CREATININE 0.71  CALCIUM 8.6*    PT/INR:  Recent Labs    10/26/17 0338  LABPROT 14.7  INR 1.16   ABG    Component Value Date/Time   PHART 7.339 (L) 10/21/2017 1708   HCO3 24.6 10/21/2017 1708   TCO2 27 10/22/2017 1715   ACIDBASEDEF 2.0 10/21/2017 1708   O2SAT 98.0 10/21/2017 1708   CBG (last 3)  Recent Labs    10/23/17 0809  GLUCAP 95    Assessment/Plan: S/P Procedure(s) (LRB): 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 CV- NSR, continue Lopressor 2. INR 1.16, continue coumadin at 5 mg daily, plan for repeat PT/INR on Wednesday, goal INR will be 2.0 3. Pulm- no acute issues, off oxygen, small pleural effusions on CXR, continue IS 4. Renal- creatinine stable, weight has trended down, will give 7 days of lasix for effusions 5. Dispo- patient stable, maintaining NSR, will d/c home today   LOS: 5 days    Erin Barrett 10/26/2017  I have seen and examined the patient and agree with the assessment and plan as outlined.  Rexene Alberts, MD 10/26/2017 8:44 AM

## 2017-10-26 NOTE — Progress Notes (Signed)
Kristy Lewis to be D/C'd Home per MD order. Discussed with the patient and all questions fully answered.    An After Visit Summary was printed and given to the patient.   [name]  10/26/2017 12:11 PM

## 2017-10-27 MED FILL — Potassium Chloride Inj 2 mEq/ML: INTRAVENOUS | Qty: 40 | Status: AC

## 2017-10-27 MED FILL — Dexmedetomidine HCl in NaCl 0.9% IV Soln 400 MCG/100ML: INTRAVENOUS | Qty: 100 | Status: AC

## 2017-10-27 MED FILL — Insulin Regular (Human) Inj 100 Unit/ML: INTRAMUSCULAR | Qty: 1 | Status: AC

## 2017-10-27 MED FILL — Magnesium Sulfate Inj 50%: INTRAMUSCULAR | Qty: 10 | Status: AC

## 2017-10-27 MED FILL — Sodium Chloride IV Soln 0.9%: INTRAVENOUS | Qty: 100 | Status: AC

## 2017-10-27 MED FILL — Heparin Sodium (Porcine) Inj 1000 Unit/ML: INTRAMUSCULAR | Qty: 30 | Status: AC

## 2017-10-28 ENCOUNTER — Ambulatory Visit (INDEPENDENT_AMBULATORY_CARE_PROVIDER_SITE_OTHER): Payer: BLUE CROSS/BLUE SHIELD | Admitting: *Deleted

## 2017-10-28 DIAGNOSIS — Z5181 Encounter for therapeutic drug level monitoring: Secondary | ICD-10-CM | POA: Insufficient documentation

## 2017-10-28 DIAGNOSIS — Q211 Atrial septal defect, unspecified: Secondary | ICD-10-CM

## 2017-10-28 DIAGNOSIS — Q2116 Sinus venosus atrial septal defect, unspecified: Secondary | ICD-10-CM

## 2017-10-28 LAB — POCT INR: INR: 2.3 (ref 2.0–3.0)

## 2017-10-28 NOTE — Patient Instructions (Signed)
Start warfarin 1/2 tablet daily except 1 tablet on Sundays and Wednesdays Recheck on Tuesday

## 2017-10-29 LAB — ECHO TEE
AV Mean grad: 3 mmHg
LVOT diameter: 18 mm
Mean grad: 1 mmHg
SINUS: 3.1 cm
STJ: 2.3 cm

## 2017-11-03 ENCOUNTER — Ambulatory Visit (INDEPENDENT_AMBULATORY_CARE_PROVIDER_SITE_OTHER): Payer: BLUE CROSS/BLUE SHIELD | Admitting: *Deleted

## 2017-11-03 ENCOUNTER — Encounter: Payer: Self-pay | Admitting: Student

## 2017-11-03 ENCOUNTER — Other Ambulatory Visit (HOSPITAL_COMMUNITY)
Admission: RE | Admit: 2017-11-03 | Discharge: 2017-11-03 | Disposition: A | Discharge status: Home / Self Care | Payer: BLUE CROSS/BLUE SHIELD | Source: Ambulatory Visit | Attending: Student | Admitting: Student

## 2017-11-03 ENCOUNTER — Ambulatory Visit (INDEPENDENT_AMBULATORY_CARE_PROVIDER_SITE_OTHER): Payer: BLUE CROSS/BLUE SHIELD | Admitting: Student

## 2017-11-03 VITALS — BP 106/70 | HR 85 | Ht 66.0 in | Wt 166.2 lb

## 2017-11-03 DIAGNOSIS — Q211 Atrial septal defect, unspecified: Secondary | ICD-10-CM

## 2017-11-03 DIAGNOSIS — D696 Thrombocytopenia, unspecified: Secondary | ICD-10-CM

## 2017-11-03 DIAGNOSIS — Q249 Congenital malformation of heart, unspecified: Secondary | ICD-10-CM | POA: Diagnosis not present

## 2017-11-03 DIAGNOSIS — Z5181 Encounter for therapeutic drug level monitoring: Secondary | ICD-10-CM | POA: Diagnosis not present

## 2017-11-03 DIAGNOSIS — Q2116 Sinus venosus atrial septal defect, unspecified: Secondary | ICD-10-CM

## 2017-11-03 DIAGNOSIS — I10 Essential (primary) hypertension: Secondary | ICD-10-CM

## 2017-11-03 LAB — CBC WITH DIFFERENTIAL/PLATELET
Abs Immature Granulocytes: 0.02 10*3/uL (ref 0.00–0.07)
BASOS ABS: 0 10*3/uL (ref 0.0–0.1)
BASOS PCT: 0 %
EOS PCT: 1 %
Eosinophils Absolute: 0.1 10*3/uL (ref 0.0–0.5)
HEMATOCRIT: 40.3 % (ref 36.0–46.0)
Hemoglobin: 13 g/dL (ref 12.0–15.0)
IMMATURE GRANULOCYTES: 0 %
Lymphocytes Relative: 26 %
Lymphs Abs: 2.7 10*3/uL (ref 0.7–4.0)
MCH: 32.2 pg (ref 26.0–34.0)
MCHC: 32.3 g/dL (ref 30.0–36.0)
MCV: 99.8 fL (ref 80.0–100.0)
Monocytes Absolute: 0.6 10*3/uL (ref 0.1–1.0)
Monocytes Relative: 6 %
NEUTROS PCT: 67 %
NRBC: 0 % (ref 0.0–0.2)
Neutro Abs: 7 10*3/uL (ref 1.7–7.7)
PLATELETS: 372 10*3/uL (ref 150–400)
RBC: 4.04 MIL/uL (ref 3.87–5.11)
RDW: 13.4 % (ref 11.5–15.5)
WBC: 10.5 10*3/uL (ref 4.0–10.5)

## 2017-11-03 LAB — POCT INR: INR: 1.6 — AB (ref 2.0–3.0)

## 2017-11-03 NOTE — Patient Instructions (Signed)
Take coumadin 1 tablet tonight then increase dose to 1 tablet daily except 1/2 tablet on Tuesdays, Thursdays and Saturdays. Recheck on Tuesday 10/22

## 2017-11-03 NOTE — Progress Notes (Signed)
Cardiology Office Note    Date:  11/03/2017   ID:  Kristy Lewis, DOB [birth year], MRN [id number]  PCP:  Monico Blitz, MD  Cardiologist: Kate Sable, MD    Chief Complaint  Patient presents with  . Hospitalization Follow-up    History of Present Illness:    Kristy Lewis is a 57 y.o. female with past medical history of congenital heart disease, HTN, and tobacco use who presents to the office today for hospital follow-up.  She had been examined by Dr. Bronson Ing in 07/2017 and reported progressive chest pain and shortness of breath with activity. Cardiac catheterization was performed for definitive evaluation and this showed normal coronary arteries with likely congenital heart disease with left to right shunting and a Coronary CTA was recommended.  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  She was therefore referred to Dr. [name] with CT surgery and risk and benefits of surgical repair were reviewed with the patient and she agreed to proceed. 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 She overall progressed well following surgery and maintained normal sinus rhythm during admission. She did have postoperative thrombocytopenia but platelet count had improved to 96K on last check on 10/24/2017.  She was started on Coumadin for anticoagulation and INR was 1.16 at the time of discharge.  In talking with the patient today, she reports overall progressing well since her recent hospitalization. She has noted pain along the suture along along her right breast and where her chest tubes were removed but reports this continues to improve. She has only been using Tramadol once daily and has been taking Tylenol otherwise. She does report constipation with pain medication use and has been using a  laxative as needed.  She denies any specific orthopnea, PND, lower extremity edema, palpitations, or exertional chest pain. Does report her respiratory status has improved since discharge.   Past Medical History:  Diagnosis Date  . Anginal pain ([place])    Comes and goes, pt states its normal to have every day   . ASD (atrial septal defect), sinus venosus defect   . Bipolar disorder ([place])    pt denies  . Cardiac murmur   . Carpal tunnel syndrome   . Depressive disorder   . Dysphagia   . Dyspnea    Upon exertion  . Esophageal reflux   . Genital herpes    at age 75  . Hypercholesteremia   . Migraines   . 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 Urinary incontinence     Past Surgical History:  Procedure Laterality Date  . ABDOMINAL HYSTERECTOMY     with bladder surgery. Ovaries remained  . ASD REPAIR N/A 10/21/2017   Procedure: MINIMALLY INVASIVE REPAIR OF SINUS VENOSUS ATRIAL SEPTAL DEFECT (ASD), CORRECTION OF PARTIAL ANOMALOUS PULMONARY VENOUS RETURN, CLOSURE OF PATENT FORAMEN OVALE;  Surgeon: Rexene Alberts, MD;  Location: [facility];  Service: Open Heart Surgery;  Laterality: N/A;  . COLONOSCOPY  10/15/2011   Procedure: COLONOSCOPY;  Surgeon: Rogene Houston, MD;  [facility];  Service: Endoscopy;  Laterality: N/A;  200  . HEMORRHOID SURGERY    . left shoulder manipulation    . RIGHT/LEFT HEART CATH AND CORONARY ANGIOGRAPHY N/A 08/05/2017   Procedure: RIGHT/LEFT HEART  CATH AND CORONARY ANGIOGRAPHY;  Surgeon: Belva Crome, MD;  Location: [facility];  Service: Cardiovascular;  Laterality: N/A;  . TEE WITHOUT CARDIOVERSION N/A 10/21/2017   Procedure: TRANSESOPHAGEAL ECHOCARDIOGRAM (TEE);  Surgeon: Rexene Alberts, MD;  Location: [facility];  Service: Open Heart Surgery;  Laterality: N/A;  . TONSILLECTOMY      Current Medications: Outpatient Medications Prior to Visit  Medication Sig Dispense  Refill  . acetaminophen (TYLENOL) 500 MG tablet Take 2 tablets (1,000 mg total) by mouth every 6 (six) hours as needed. 30 tablet 0  . cyclobenzaprine (FLEXERIL) 5 MG tablet Take 1 tablet (5 mg total) by mouth 3 (three) times daily as needed for muscle spasms. 30 tablet 0  . diazepam (VALIUM) 2 MG tablet Take 2 mg by mouth 2 (two) times daily as needed for anxiety.     Marland Kitchen FLUoxetine (PROZAC) 40 MG capsule Take 40 mg by mouth daily.     . metoprolol tartrate (LOPRESSOR) 25 MG tablet Take 0.5 tablets (12.5 mg total) by mouth 2 (two) times daily. 60 tablet 3  . nitroGLYCERIN (NITROSTAT) 0.4 MG SL tablet Place 1 tablet (0.4 mg total) under the tongue every 5 (five) minutes as needed for chest pain. 30 tablet 0  . SUMAtriptan (IMITREX) 50 MG tablet Take 50 mg by mouth every 2 (two) hours as needed for migraine. May repeat in 2 hours if headache persists or recurs.    . traMADol (ULTRAM) 50 MG tablet Take 2 tablets (100 mg total) by mouth every 4 (four) hours as needed for moderate pain. 40 tablet 0  . warfarin (COUMADIN) 5 MG tablet Take 1 tablet (5 mg total) by mouth daily at 6 PM. 30 tablet 3  . Ascorbic Acid (VITAMIN C PO) Take 1 tablet by mouth daily.    . diphenhydrAMINE (BENADRYL) 25 mg capsule Take 25 mg by mouth at bedtime as needed for allergies or sleep.    . furosemide (LASIX) 40 MG tablet Take 1 tablet (40 mg total) by mouth daily. 7 tablet 0  . potassium chloride SA (K-DUR,KLOR-CON) 20 MEQ tablet Take 1 tablet (20 mEq total) by mouth daily. 7 tablet 0   No facility-administered medications prior to visit.      Allergies:   Iodine; Morphine and related; and Shellfish allergy   Social History   Socioeconomic History  . Marital status: Divorced    Spouse name: Not on file  . Number of children: Not on file  . Years of education: Not on file  . Highest education level: Not on file  Occupational History  . Not on file  Social Needs  . Financial resource strain: Not on file  . Food  insecurity:    Worry: Not on file    Inability: Not on file  . Transportation needs:    Medical: Not on file    Non-medical: Not on file  Tobacco Use  . Smoking status: Former Smoker    Packs/day: 0.50    Last attempt to quit: 08/05/2017    Years since quitting: 0.2  . Smokeless tobacco: Never Used  . Tobacco comment: quit 2 yrs ago after smoking for over 20 yrs.  Substance and Sexual Activity  . Alcohol use: Not Currently    Alcohol/week: 2.0 - 3.0 standard drinks    Types: 2 - 3 Glasses of wine per week    Comment: socially  . Drug use: No  . Sexual activity: Not on file  Lifestyle  .  Physical activity:    Days per week: Not on file    Minutes per session: Not on file  . Stress: Not on file  Relationships  . Social connections:    Talks on phone: Not on file    Gets together: Not on file    Attends religious service: Not on file    Active member of club or organization: Not on file    Attends meetings of clubs or organizations: Not on file    Relationship status: Not on file  Other Topics Concern  . Not on file  Social History Narrative  . Not on file     Family History:  The patient's family history includes Hypertension in her mother.   Review of Systems:   Please see the history of present illness.     General:  No chills, fever, night sweats or weight changes.  Cardiovascular:  No edema, orthopnea, palpitations, paroxysmal nocturnal dyspnea. Positive for dyspnea (improving) and pain along suture lines.  Dermatological: No rash, lesions/masses Respiratory: No cough, dyspnea Urologic: No hematuria, dysuria Abdominal:   No nausea, vomiting, diarrhea, bright red blood per rectum, melena, or hematemesis Neurologic:  No visual changes, wkns, changes in mental status. All other systems reviewed and are otherwise negative except as noted above.   Physical Exam:    VS:  BP 106/70   Pulse 85   Ht 5\' 6"  (1.676 m)   Wt 166 lb 3.2 oz (75.4 kg)   SpO2 95%   BMI  26.83 kg/m    General: Well developed, well nourished Caucasian female appearing in no acute distress. Head: Normocephalic, atraumatic, sclera non-icteric, no xanthomas, nares are without discharge.  Neck: No carotid bruits. JVD not elevated.  Lungs: Respirations regular and unlabored, without wheezes or rales.  Heart: Regular rate and rhythm. No S3 or S4.  No murmur, no rubs, or gallops appreciated. Suture lines appear well-healing without erythema or drainage noted. Abdomen: Soft, non-tender, non-distended with normoactive bowel sounds. No hepatomegaly. No rebound/guarding. No obvious abdominal masses. Msk:  Strength and tone appear normal for age. No joint deformities or effusions. Extremities: No clubbing or cyanosis. No lower extremity edema.  Distal pedal pulses are 2+ bilaterally. Neuro: Alert and oriented X 3. Moves all extremities spontaneously. No focal deficits noted. Psych:  Responds to questions appropriately with a normal affect. Skin: No rashes or lesions noted  Wt Readings from Last 3 Encounters:  11/03/17 166 lb 3.2 oz (75.4 kg)  10/26/17 163 lb 8 oz (74.2 kg)  10/19/17 166 lb (75.3 kg)     Studies/Labs Reviewed:   EKG:  EKG is not ordered today.   Recent Labs: 10/19/2017: ALT 17 10/22/2017: Magnesium 2.4 10/24/2017: BUN 10; Creatinine, Ser 0.71; Potassium 3.6; Sodium 137 11/03/2017: Hemoglobin 13.0; Platelets 372   Lipid Panel No results found for: CHOL, TRIG, HDL, CHOLHDL, VLDL, LDLCALC, LDLDIRECT  Additional studies/ records that were reviewed today include:   Cardiac Catheterization: 07/2017  Normal coronary arteries.  Normal LV size and function.  Normal pulmonary artery pressures.  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    Qp/Qs greater than 2.7:1 (using IVC O2 sat as mixed venous in  calculation).  RECOMMENDATIONS:   Needs to have morphology identified to determine pulmonary vein drainage, and exclude ASD.  Discussed morphologic evaluation with Dr. Ena Dawley and the next step should  be cardiac CT Angio.   TEE: 10/21/2017  Septum: Large sinus venosus interatrial septal defect present with left to right shunting visualized by color doppler. Small Patent Foramen Ovale present with left to right shunt visualized by color doppler.  Left atrium: Patent foramen ovale present with left to right shunting indicated by color flow Doppler. Large sinus venosus interatrial septal defect present with left to right shunting indicated by color flow Doppler.  Right ventricle: Normal wall thickness and ejection fraction. Cavity is severely dilated.  Tricuspid valve: Mild regurgitation. The tricuspid valve regurgitation jet is central.  Pericardium: Trivial pericardial effusion.  Pulmonary artery: Pulmonary artery is dilated.  Mitral valve: No leaflet thickening and calcification present. Mild mitral annular calcification.  Right atrium: Cavity is dilated.  Aortic valve: The valve is trileaflet. No stenosis. No regurgitation.  Assessment:    1. ASD (atrial septal defect), sinus venosus defect   2. Congenital heart disease   3. Essential hypertension   4. Thrombocytopenia ([place])      Plan:   In order of problems listed above:  1. 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  She has overall progressed well since surgery and reports pain along her surgical sites continues to improve. Asked for her sutures to be removed today and I informed her this will be performed by CT Surgery at the time of her follow-up.  - Plans are for her to remain on Coumadin for a duration of 3 months by review of CT Surgery notes. INR was recently checked  today and low at 1.6, therefore her Coumadin dosing was appropriately adjusted. She will remain on Lopressor 12.5 mg twice daily. She has completed her course of Lasix and appears euvolemic by examination. I encouraged her to follow daily weights and monitor sodium intake.    2. HTN - BP is well controlled at 106/70 during today's visit. Will continue Lopressor 12.5 mg twice daily.  3. Thrombocytopenia - platelet count was 96K upon hospital discharge. She does report having occasional hemorrhoids due to constipation but denies any other evidence of active bleeding. Will recheck CBC today.    Medication Adjustments/Labs and Tests Ordered: Current medicines are reviewed at length with the patient today.  Concerns regarding medicines are outlined above.  Medication changes, Labs and Tests ordered today are listed in the Patient Instructions below. Patient Instructions  Medication Instructions:  Your physician recommends that you continue on your current medications as directed. Please refer to the Current Medication list given to you today.  If you need a refill on your cardiac medications before your next appointment, please call your pharmacy.   Lab work: Your physician recommends that you return for lab work in: today.  If you have labs (blood work) drawn today and your tests are completely normal, you will receive your results only by: Marland Kitchen MyChart Message (if you have MyChart) OR . A paper copy in the mail If you have any lab test that is abnormal or we need to change your treatment, we will call you to review the results.  Testing/Procedures: NONE   Follow-Up: At [facility], you and your health needs are our priority.  As part of our continuing mission to provide you with exceptional heart care, we have created designated Provider Care Teams.  These Care Teams include your primary Cardiologist (physician) and Advanced Practice Providers (APPs -  Physician  Assistants and Nurse  Practitioners) who all work together to provide you with the care you need, when you need it. You will need a follow up appointment as planned.  Please call our office 2 months in advance to schedule this appointment.  You may see Kate Sable, MD or one of the following Advanced Practice Providers on your designated Care Team:   Bernerd Pho, PA-C [facility]) . Ermalinda Barrios, PA-C ([place])  Any Other Special Instructions Will Be Listed Below (If Applicable). Thank you for choosing [facility]!      Signed, Erma Heritage, PA-C  11/03/2017 5:05 PM    [place] [address] [place], [place] [address] Phone: [phone]

## 2017-11-03 NOTE — Patient Instructions (Signed)
Medication Instructions:  Your physician recommends that you continue on your current medications as directed. Please refer to the Current Medication list given to you today.  If you need a refill on your cardiac medications before your next appointment, please call your pharmacy.   Lab work: Your physician recommends that you return for lab work in: today.  If you have labs (blood work) drawn today and your tests are completely normal, you will receive your results only by: Marland Kitchen MyChart Message (if you have MyChart) OR . A paper copy in the mail If you have any lab test that is abnormal or we need to change your treatment, we will call you to review the results.  Testing/Procedures: NONE   Follow-Up: At [facility], you and your health needs are our priority.  As part of our continuing mission to provide you with exceptional heart care, we have created designated Provider Care Teams.  These Care Teams include your primary Cardiologist (physician) and Advanced Practice Providers (APPs -  Physician Assistants and Nurse Practitioners) who all work together to provide you with the care you need, when you need it. You will need a follow up appointment as planned.  Please call our office 2 months in advance to schedule this appointment.  You may see Kate Sable, MD or one of the following Advanced Practice Providers on your designated Care Team:   Bernerd Pho, PA-C [facility]) . Ermalinda Barrios, PA-C ([place])  Any Other Special Instructions Will Be Listed Below (If Applicable). Thank you for choosing [facility]!

## 2017-11-04 ENCOUNTER — Ambulatory Visit: Payer: BLUE CROSS/BLUE SHIELD | Admitting: Physician Assistant

## 2017-11-05 ENCOUNTER — Telehealth: Payer: Self-pay

## 2017-11-05 NOTE — Telephone Encounter (Signed)
Patient contacted the office today concerned because after she got out of the shower today she had small "welps" pop up all her arms and back.  She stated that she has been taking her medications since she was discharged from the hospital which leads me to believe it may not be a reaction to medications.  I did, however advise her to contact her Cardiologist to let them know as well because Coumadin is a new medication since hospitalization.  She also stated she is taking Tramadol for pain as well.  I told her she could stop that and take tylenol to see if that was the cause.  While on the phone she looked through her bed and found a tick.  After that, I advised her to contact her PCP and get check out with an appointment.  She acknowledged receipt.

## 2017-11-06 ENCOUNTER — Other Ambulatory Visit: Payer: Self-pay | Admitting: Thoracic Surgery (Cardiothoracic Vascular Surgery)

## 2017-11-06 DIAGNOSIS — Z8774 Personal history of (corrected) congenital malformations of heart and circulatory system: Secondary | ICD-10-CM

## 2017-11-09 ENCOUNTER — Ambulatory Visit
Admission: RE | Admit: 2017-11-09 | Discharge: 2017-11-09 | Disposition: A | Discharge status: Home / Self Care | Payer: BLUE CROSS/BLUE SHIELD | Source: Ambulatory Visit | Attending: Thoracic Surgery (Cardiothoracic Vascular Surgery) | Admitting: Thoracic Surgery (Cardiothoracic Vascular Surgery)

## 2017-11-09 ENCOUNTER — Encounter: Payer: Self-pay | Admitting: Thoracic Surgery (Cardiothoracic Vascular Surgery)

## 2017-11-09 ENCOUNTER — Other Ambulatory Visit: Payer: Self-pay

## 2017-11-09 ENCOUNTER — Ambulatory Visit (INDEPENDENT_AMBULATORY_CARE_PROVIDER_SITE_OTHER): Payer: Self-pay | Admitting: Thoracic Surgery (Cardiothoracic Vascular Surgery)

## 2017-11-09 VITALS — BP 100/71 | HR 70 | Resp 16 | Ht 66.0 in | Wt 165.0 lb

## 2017-11-09 DIAGNOSIS — Z8774 Personal history of (corrected) congenital malformations of heart and circulatory system: Secondary | ICD-10-CM

## 2017-11-09 DIAGNOSIS — Q211 Atrial septal defect, unspecified: Secondary | ICD-10-CM

## 2017-11-09 DIAGNOSIS — Q2112 Patent foramen ovale: Secondary | ICD-10-CM

## 2017-11-09 MED ORDER — TRAMADOL HCL 50 MG PO TABS: 50.0000 mg | ORAL_TABLET | Freq: Four times a day (QID) | ORAL | 0 refills | Status: DC | PRN

## 2017-11-09 NOTE — Patient Instructions (Addendum)
Continue all previous medications without any changes at this time  You may continue to gradually increase your physical activity as tolerated.  Refrain from any heavy lifting or strenuous use of your arms and shoulders until at least 8 weeks from the time of your surgery, and avoid activities that cause increased pain in your chest on the side of your surgical incision.  Otherwise you may continue to increase activities without any particular limitations.  Increase the intensity and duration of physical activity gradually.  You may return to driving an automobile when you no longer requiring oral narcotic pain relievers during the daytime.  It would be wise to start driving only short distances during the daylight and gradually increase from there as you feel comfortable.  You are encouraged to enroll and participate in the outpatient cardiac rehab program beginning as soon as practical.

## 2017-11-09 NOTE — Progress Notes (Signed)
[address]       [place] [address]             [phone]     CARDIOTHORACIC SURGERY OFFICE NOTE  Referring Provider is Herminio Commons, MD PCP is Monico Blitz, MD   HPI:  Patient is a 57 year old female who returns the office today for routine follow-up status post minimally invasive repair of sinus venosus type atrial septal defect with partial anomalous pulmonary venous return on October 21, 2017.  Her early postoperative recovery was uneventful and she was discharged home on the fifth postoperative day.  She was discharged on warfarin anticoagulation with plans for a total of 3 months postoperatively to decrease risk of patch thrombosis.  Since hospital discharge she has been seen in follow-up by [place] at [facility].  Her INR was checked at that time and remains subtherapeutic at 1.6.  Her Coumadin dose was adjusted.  She returns to our office today for routine follow-up.  She reports that since hospital discharge she is doing fairly well.  She still gets tired quite easily and she has some residual soreness in the right side of her chest.  She otherwise feels pretty well.  She is still using some oral pain relievers.  Appetite is good.  She is sleeping well at night.  She has no shortness of breath.   Current Outpatient Medications  Medication Sig Dispense Refill  . acetaminophen (TYLENOL) 500 MG tablet Take 2 tablets (1,000 mg total) by mouth every 6 (six) hours as needed. 30 tablet 0  . bisacodyl (DULCOLAX) 5 MG EC tablet Take 5 mg by mouth daily as needed for moderate constipation.    . cyclobenzaprine (FLEXERIL) 5 MG tablet Take 1 tablet (5 mg total) by mouth 3 (three) times daily as needed for muscle spasms. 30 tablet 0  . diazepam (VALIUM) 2 MG tablet Take 2 mg by mouth 2 (two) times daily as needed for anxiety.     . docusate sodium (COLACE) 100 MG capsule Take 100 mg by mouth daily.    Marland Kitchen FLUoxetine (PROZAC) 40 MG capsule Take 40 mg  by mouth daily.     . metoprolol tartrate (LOPRESSOR) 25 MG tablet Take 0.5 tablets (12.5 mg total) by mouth 2 (two) times daily. 60 tablet 3  . SUMAtriptan (IMITREX) 50 MG tablet Take 50 mg by mouth every 2 (two) hours as needed for migraine. May repeat in 2 hours if headache persists or recurs.    . traMADol (ULTRAM) 50 MG tablet Take 2 tablets (100 mg total) by mouth every 4 (four) hours as needed for moderate pain. (Patient taking differently: Take 50-100 mg by mouth every 6 (six) hours as needed for moderate pain. ) 40 tablet 0  . warfarin (COUMADIN) 5 MG tablet Take 1 tablet (5 mg total) by mouth daily at 6 PM. 30 tablet 3  . nitroGLYCERIN (NITROSTAT) 0.4 MG SL tablet Place 1 tablet (0.4 mg total) under the tongue every 5 (five) minutes as needed for chest pain. (Patient not taking: Reported on 11/09/2017) 30 tablet 0   No current facility-administered medications for this visit.       Physical Exam:   BP 100/71 (BP Location: Right Arm, Patient Position: Sitting, Cuff Size: Large)   Pulse 70   Resp 16   Ht 5\' 6"  (1.676 m)   Wt 165 lb (74.8 kg)   SpO2 95% Comment: RA  BMI 26.63 kg/m   General:  Well-appearing  Chest:   Clear to auscultation  CV:   Regular rate and rhythm without murmur  Incisions:  Healing nicely  Abdomen:  Soft nontender  Extremities:  Warm and well-perfused  Diagnostic Tests:  CHEST - 2 VIEW  COMPARISON:  10/25/2017  FINDINGS: Mild chronic elevation of the right diaphragm. Mild bilateral interstitial thickening. No pleural effusion or pneumothorax. Mild right basilar atelectasis. No focal consolidation. Stable cardiomediastinal silhouette. No acute osseous abnormality.  IMPRESSION: 1. No acute cardiopulmonary disease. Mild right basilar atelectasis.   Electronically Signed   By: Kathreen Devoid   On: 11/09/2017 15:21   Impression:  Patient is doing well less than 3 weeks status post minimally invasive repair of sinus venosus type atrial  septal defect with partial anomalous pulmonary venous return.  Plan:  I have encouraged the patient to continue to gradually increase her physical activity as tolerated with her primary limitation remaining that she refrain from significant reaching or strenuous use of her right arm and shoulder.  I have encouraged her to walk more.  Once she is no longer requiring oral narcotic pain relievers she may resume driving an automobile.  I have encouraged her to enroll and participate in the cardiac rehab program.  We have not recommended any changes to her current medications.  We will plan to continue warfarin anticoagulation for approximately 3 months from the time of surgery.  The patient will return to our office for routine follow-up in approximately 2 months.  She will call and return sooner should specific problems or questions arise.    Valentina Gu. [name], MD 11/09/2017 3:39 PM

## 2017-11-10 ENCOUNTER — Other Ambulatory Visit: Payer: Self-pay

## 2017-11-12 ENCOUNTER — Ambulatory Visit (INDEPENDENT_AMBULATORY_CARE_PROVIDER_SITE_OTHER): Payer: BLUE CROSS/BLUE SHIELD | Admitting: *Deleted

## 2017-11-12 DIAGNOSIS — Q211 Atrial septal defect, unspecified: Secondary | ICD-10-CM

## 2017-11-12 DIAGNOSIS — Z5181 Encounter for therapeutic drug level monitoring: Secondary | ICD-10-CM

## 2017-11-12 LAB — POCT INR: INR: 2.7 (ref 2.0–3.0)

## 2017-11-12 NOTE — Patient Instructions (Signed)
Continue coumadin 1 tablet daily except 1/2 tablet on Tuesdays, Thursdays and Saturdays. Recheck on Tuesday 11/5

## 2017-11-24 ENCOUNTER — Ambulatory Visit (INDEPENDENT_AMBULATORY_CARE_PROVIDER_SITE_OTHER): Payer: BLUE CROSS/BLUE SHIELD | Admitting: *Deleted

## 2017-11-24 DIAGNOSIS — Q211 Atrial septal defect, unspecified: Secondary | ICD-10-CM

## 2017-11-24 DIAGNOSIS — Z8774 Personal history of (corrected) congenital malformations of heart and circulatory system: Secondary | ICD-10-CM | POA: Diagnosis not present

## 2017-11-24 DIAGNOSIS — Z5181 Encounter for therapeutic drug level monitoring: Secondary | ICD-10-CM

## 2017-11-24 LAB — POCT INR: INR: 1.7 — AB (ref 2.0–3.0)

## 2017-11-24 NOTE — Patient Instructions (Signed)
Take coumadin 1 tablet tonight then increase dose to 1 tablet daily except 1/2 tablet on Tuesdays and Saturdays. Recheck on Tuesday 11/12

## 2017-12-01 ENCOUNTER — Ambulatory Visit (INDEPENDENT_AMBULATORY_CARE_PROVIDER_SITE_OTHER): Payer: BLUE CROSS/BLUE SHIELD | Admitting: *Deleted

## 2017-12-01 DIAGNOSIS — Z5181 Encounter for therapeutic drug level monitoring: Secondary | ICD-10-CM | POA: Diagnosis not present

## 2017-12-01 DIAGNOSIS — Q211 Atrial septal defect, unspecified: Secondary | ICD-10-CM

## 2017-12-01 LAB — POCT INR: INR: 2.3 (ref 2.0–3.0)

## 2017-12-01 NOTE — Patient Instructions (Signed)
Continue coumadin 1 tablet daily except 1/2 tablet on Tuesdays and Saturdays. Recheck in 1 week

## 2017-12-07 ENCOUNTER — Telehealth: Payer: Self-pay

## 2017-12-07 NOTE — Telephone Encounter (Signed)
Ms. Kristy Lewis contacted the office on 12/03/17 requesting advise because she states that she is still weak and gets winded when walking.  She is s/p Mini repair ASD, and anomalous correction with Dr. [name] on 10/21/17.  I advised her that this is normal being she is just shy of a month out of surgery.  I advised her according to Dr. Guy Sandifer last note to continue to increase her exercise and walk.  I also advised her that she should not use her right arm or shoulder to do extended reaching or lifting per Dr. Guy Sandifer note at her last follow-up appointment.  She stated that it is hard to walk outside as it is cold.  I advised her that she could walk in a gym or go to the mall.  She acknowledged receipt.

## 2017-12-08 ENCOUNTER — Ambulatory Visit: Payer: BLUE CROSS/BLUE SHIELD | Admitting: Cardiovascular Disease

## 2017-12-08 ENCOUNTER — Other Ambulatory Visit: Payer: Self-pay

## 2017-12-08 ENCOUNTER — Ambulatory Visit (INDEPENDENT_AMBULATORY_CARE_PROVIDER_SITE_OTHER): Payer: BLUE CROSS/BLUE SHIELD | Admitting: *Deleted

## 2017-12-08 DIAGNOSIS — Z5181 Encounter for therapeutic drug level monitoring: Secondary | ICD-10-CM

## 2017-12-08 DIAGNOSIS — Q211 Atrial septal defect, unspecified: Secondary | ICD-10-CM

## 2017-12-08 LAB — POCT INR: INR: 2.2 (ref 2.0–3.0)

## 2017-12-08 NOTE — Patient Instructions (Signed)
Continue coumadin 1 tablet daily except 1/2 tablet on Tuesdays and Saturdays. Recheck in 2 weeks

## 2017-12-08 NOTE — Progress Notes (Signed)
Patient contacted the office concerned about on going pain after surgery with Dr. [name] 10/21/2017.  She states that it is sometimes worse requiring pain medication.  She is taking Tylenol and Tramadol and has some relief she states when she "sits down and relaxes".  I advised her that this is normal being all patient's pain tolerance is different and not one patient's pain is the same.  She requested to come in to be seen by a PA because she is to not seen Dr. [name] until 01/18/18.  Patient was given a time and date of 12/14/17 with a PA to be evaluated with a chest xray.

## 2017-12-11 ENCOUNTER — Encounter (HOSPITAL_COMMUNITY): Payer: Self-pay | Admitting: Emergency Medicine

## 2017-12-11 ENCOUNTER — Emergency Department (HOSPITAL_COMMUNITY)
Admission: EM | Admit: 2017-12-11 | Discharge: 2017-12-11 | Disposition: A | Discharge status: Home / Self Care | Payer: BLUE CROSS/BLUE SHIELD | Attending: Emergency Medicine | Admitting: Emergency Medicine

## 2017-12-11 ENCOUNTER — Telehealth: Payer: Self-pay | Admitting: Cardiovascular Disease

## 2017-12-11 ENCOUNTER — Telehealth: Payer: Self-pay

## 2017-12-11 ENCOUNTER — Emergency Department (HOSPITAL_COMMUNITY): Payer: BLUE CROSS/BLUE SHIELD

## 2017-12-11 ENCOUNTER — Other Ambulatory Visit: Payer: Self-pay

## 2017-12-11 DIAGNOSIS — I313 Pericardial effusion (noninflammatory): Secondary | ICD-10-CM | POA: Insufficient documentation

## 2017-12-11 DIAGNOSIS — Q211 Atrial septal defect, unspecified: Secondary | ICD-10-CM

## 2017-12-11 DIAGNOSIS — R079 Chest pain, unspecified: Secondary | ICD-10-CM | POA: Diagnosis present

## 2017-12-11 DIAGNOSIS — Z87891 Personal history of nicotine dependence: Secondary | ICD-10-CM | POA: Insufficient documentation

## 2017-12-11 DIAGNOSIS — R0789 Other chest pain: Secondary | ICD-10-CM

## 2017-12-11 DIAGNOSIS — R0602 Shortness of breath: Secondary | ICD-10-CM | POA: Diagnosis not present

## 2017-12-11 DIAGNOSIS — Z79899 Other long term (current) drug therapy: Secondary | ICD-10-CM | POA: Insufficient documentation

## 2017-12-11 DIAGNOSIS — R05 Cough: Secondary | ICD-10-CM | POA: Insufficient documentation

## 2017-12-11 DIAGNOSIS — Z7901 Long term (current) use of anticoagulants: Secondary | ICD-10-CM | POA: Insufficient documentation

## 2017-12-11 DIAGNOSIS — J40 Bronchitis, not specified as acute or chronic: Secondary | ICD-10-CM

## 2017-12-11 LAB — CBC WITH DIFFERENTIAL/PLATELET
ABS IMMATURE GRANULOCYTES: 0.03 10*3/uL (ref 0.00–0.07)
Basophils Absolute: 0 10*3/uL (ref 0.0–0.1)
Basophils Relative: 0 %
Eosinophils Absolute: 0 10*3/uL (ref 0.0–0.5)
Eosinophils Relative: 0 %
HEMATOCRIT: 38 % (ref 36.0–46.0)
HEMOGLOBIN: 12.3 g/dL (ref 12.0–15.0)
Immature Granulocytes: 0 %
LYMPHS PCT: 18 %
Lymphs Abs: 2.1 10*3/uL (ref 0.7–4.0)
MCH: 31.7 pg (ref 26.0–34.0)
MCHC: 32.4 g/dL (ref 30.0–36.0)
MCV: 97.9 fL (ref 80.0–100.0)
MONOS PCT: 6 %
Monocytes Absolute: 0.7 10*3/uL (ref 0.1–1.0)
NEUTROS ABS: 9.1 10*3/uL — AB (ref 1.7–7.7)
Neutrophils Relative %: 76 %
Platelets: 305 10*3/uL (ref 150–400)
RBC: 3.88 MIL/uL (ref 3.87–5.11)
RDW: 12.7 % (ref 11.5–15.5)
WBC: 11.9 10*3/uL — ABNORMAL HIGH (ref 4.0–10.5)
nRBC: 0 % (ref 0.0–0.2)

## 2017-12-11 LAB — TROPONIN I: Troponin I: <0.03 ng/mL (ref <0.03)

## 2017-12-11 LAB — BASIC METABOLIC PANEL
Anion gap: 7 (ref 5–15)
BUN: 9 mg/dL (ref 6–20)
CO2: 24 mmol/L (ref 22–32)
Calcium: 8.8 mg/dL — ABNORMAL LOW (ref 8.9–10.3)
Chloride: 101 mmol/L (ref 98–111)
Creatinine, Ser: 0.77 mg/dL (ref 0.44–1.00)
GFR calc Af Amer: >60 mL/min (ref >60)
GFR calc non Af Amer: >60 mL/min (ref >60)
Glucose, Bld: 118 mg/dL — ABNORMAL HIGH (ref 70–99)
Potassium: 3.8 mmol/L (ref 3.5–5.1)
Sodium: 132 mmol/L — ABNORMAL LOW (ref 135–145)

## 2017-12-11 LAB — PROTIME-INR
INR: 1.39
Prothrombin Time: 16.9 seconds — ABNORMAL HIGH (ref 11.4–15.2)

## 2017-12-11 LAB — BRAIN NATRIURETIC PEPTIDE: B Natriuretic Peptide: 165 pg/mL — ABNORMAL HIGH (ref 0.0–100.0)

## 2017-12-11 MED ORDER — ASPIRIN 81 MG PO CHEW: 324.0000 mg | CHEWABLE_TABLET | Freq: Once | ORAL | Status: DC

## 2017-12-11 MED ORDER — IOPAMIDOL (ISOVUE-370) INJECTION 76%
75.0000 mL | Freq: Once | INTRAVENOUS | Status: AC | PRN
  Administered 2017-12-11: 75 mL via INTRAVENOUS

## 2017-12-11 MED ORDER — NITROGLYCERIN 0.4 MG SL SUBL
0.4000 mg | SUBLINGUAL_TABLET | SUBLINGUAL | Status: DC | PRN
  Administered 2017-12-11: 0.4 mg via SUBLINGUAL
  Filled 2017-12-11: qty 1

## 2017-12-11 MED ORDER — SODIUM CHLORIDE 0.9 % IV BOLUS
500.0000 mL | Freq: Once | INTRAVENOUS | Status: AC
  Administered 2017-12-11: 500 mL via INTRAVENOUS

## 2017-12-11 MED ORDER — FENTANYL CITRATE (PF) 100 MCG/2ML IJ SOLN
50.0000 ug | INTRAMUSCULAR | Status: DC | PRN
  Administered 2017-12-11: 50 ug via INTRAVENOUS
  Filled 2017-12-11: qty 2

## 2017-12-11 MED ORDER — DOXYCYCLINE HYCLATE 100 MG PO TABS: 100.0000 mg | ORAL_TABLET | Freq: Two times a day (BID) | ORAL | 0 refills | Status: DC

## 2017-12-11 NOTE — Telephone Encounter (Signed)
Patient c/o constant chest pain rated 4/10 that started this morning. Took tramadol and said it eased the pain some. Also c/o SOB with activity. No c/o dizziness. Patient is having active chest pain. Advised to use her nitroglycerin and go to the ED for an evaluation. Appointment scheduled to see Lenze on 12/28/17 @11 :00 am at the [place] office. Verbalized understanding of plan.

## 2017-12-11 NOTE — ED Notes (Signed)
PT GIVEN NTG SL PT GOT DIZZY LIGHTHEADED PALE AND SWEATY. PT GIVEN NS BOLUS PER ORDERS. WILL CONTINUE TO [place] CLOSELY

## 2017-12-11 NOTE — Discharge Instructions (Signed)
Take the prescription as directed. Take over the counter tylenol, as directed on packaging, as needed for discomfort. Apply moist heat or ice to the area(s) of discomfort, for 15 minutes at a time, several times per day for the next few days.  Do not fall asleep on a heating or ice pack. Your CT scan showed an incidental finding: "pericardial effusion." You will need to call your Cardiologist on Monday to schedule a follow up appointment within the next week for this finding with an outpatient echocardiogram. Your INR was low today, and you will need to have this re-checked next week. Take your usual prescriptions as previously directed.  Call your regular medical doctor and your Cardiologist on Monday to schedule a follow up appointment next week.  Return to the Emergency Department immediately sooner if worsening.

## 2017-12-11 NOTE — Telephone Encounter (Signed)
Per Dr.Nishan, pt needs echo, currently in ER    Order placed, will notify our front desk

## 2017-12-11 NOTE — Telephone Encounter (Signed)
Pt c/o of Chest Pain: 1. Are you having CP right now? Off and on  2. Are you experiencing any other symptoms (ex. SOB, nausea, vomiting, sweating)? Hurts when she is breathing 3. How long have you been experiencing CP?   1 day 4. Is your CP continuous or coming and going?  Off and on  5. Have you taken Nitroglycerin? No

## 2017-12-11 NOTE — ED Provider Notes (Signed)
[facility] EMERGENCY DEPARTMENT Provider Note   CSN: [id number] Arrival date & time: 12/11/17  1237     History   Chief Complaint Chief Complaint  Patient presents with  . Chest Pain    HPI Kristy Lewis is a 57 y.o. female.  HPI Pt was seen at 1300. Per pt, c/o gradual onset and persistence of constant mid-sternal chest "pain" that began 4 days ago after she was "washing dishes and folding laundry."  Pt describes the CP as constant x4 days, "heaviness," located in her mid-chest. Has been associated with SOB. Pt states she began to have a cough 2 days ago and have home temps to "99." Pt has been taking pain medication with mild improvement of her CP. Endorses hx of chest pain, unknown dx. Denies fevers, no abd pain, no N/V/D, no rash, no injury, no palpitations.      Past Medical History:  Diagnosis Date  . Anginal pain ([place])    Comes and goes, pt states its normal to have every day   . ASD (atrial septal defect), sinus venosus defect   . Bipolar disorder ([place])    pt denies  . Cardiac murmur   . Carpal tunnel syndrome   . Depressive disorder   . Dysphagia   . Dyspnea    Upon exertion  . Esophageal reflux   . Genital herpes    at age 54  . Hypercholesteremia   . Migraines   . 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 Urinary incontinence     Patient Active Problem List   Diagnosis Date Noted  . Encounter for therapeutic drug monitoring 10/28/2017  . 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 ASD (atrial septal defect), sinus venosus defect   . Chest pain   . Right ventricular enlargement   . Rectal bleeding 09/15/2011    Past Surgical History:  Procedure Laterality Date  . ABDOMINAL HYSTERECTOMY     with bladder surgery. Ovaries remained  . ASD REPAIR N/A 10/21/2017   Procedure: MINIMALLY INVASIVE REPAIR  OF SINUS VENOSUS ATRIAL SEPTAL DEFECT (ASD), CORRECTION OF PARTIAL ANOMALOUS PULMONARY VENOUS RETURN, CLOSURE OF PATENT FORAMEN OVALE;  Surgeon: Rexene Alberts, MD;  Location: [place];  Service: Open Heart Surgery;  Laterality: N/A;  . COLONOSCOPY  10/15/2011   Procedure: COLONOSCOPY;  Surgeon: Rogene Houston, MD;  [facility];  Service: Endoscopy;  Laterality: N/A;  200  . HEMORRHOID SURGERY    . left shoulder manipulation    . RIGHT/LEFT HEART CATH AND CORONARY ANGIOGRAPHY N/A 08/05/2017   Procedure: RIGHT/LEFT HEART CATH AND CORONARY ANGIOGRAPHY;  Surgeon: Belva Crome, MD;  Location: [facility];  Service: Cardiovascular;  Laterality: N/A;  . TEE WITHOUT CARDIOVERSION N/A 10/21/2017   Procedure: TRANSESOPHAGEAL ECHOCARDIOGRAM (TEE);  Surgeon: Rexene Alberts, MD;  Location: [place];  Service: Open Heart Surgery;  Laterality: N/A;  . TONSILLECTOMY       OB History   None      Home Medications    Prior to Admission medications   Medication Sig Start Date End Date Taking? Authorizing Provider  acetaminophen (TYLENOL) 500 MG tablet Take 2 tablets (1,000 mg total) by mouth every 6 (six) hours as needed. 10/26/17   Barrett, Erin R, PA-C  bisacodyl (DULCOLAX) 5 MG EC tablet Take 5 mg by mouth daily as needed for moderate  constipation.    [provider]  cyclobenzaprine (FLEXERIL) 5 MG tablet Take 1 tablet (5 mg total) by mouth 3 (three) times daily as needed for muscle spasms. 10/26/17   Barrett, Erin R, PA-C  diazepam (VALIUM) 2 MG tablet Take 2 mg by mouth 2 (two) times daily as needed for anxiety.     [provider]  docusate sodium (COLACE) 100 MG capsule Take 100 mg by mouth daily.    [provider]  FLUoxetine (PROZAC) 40 MG capsule Take 40 mg by mouth daily.     [provider]  metoprolol tartrate (LOPRESSOR) 25 MG tablet Take 0.5 tablets (12.5 mg total) by mouth 2 (two) times daily. 10/26/17   Barrett, Erin R, PA-C    nitroGLYCERIN (NITROSTAT) 0.4 MG SL tablet Place 1 tablet (0.4 mg total) under the tongue every 5 (five) minutes as needed for chest pain. Patient not taking: Reported on 11/09/2017 07/28/17   Nat Christen, MD  SUMAtriptan (IMITREX) 50 MG tablet Take 50 mg by mouth every 2 (two) hours as needed for migraine. May repeat in 2 hours if headache persists or recurs.    [provider]  traMADol (ULTRAM) 50 MG tablet Take 1 tablet (50 mg total) by mouth every 6 (six) hours as needed for moderate pain. 11/09/17   Rexene Alberts, MD  warfarin (COUMADIN) 5 MG tablet Take 1 tablet (5 mg total) by mouth daily at 6 PM. 10/26/17   Barrett, Lodema Hong, PA-C    Family History Family History  Problem Relation Age of Onset  . Hypertension Mother     Social History Social History   Tobacco Use  . Smoking status: Former Smoker    Packs/day: 0.50    Last attempt to quit: 08/05/2017    Years since quitting: 0.3  . Smokeless tobacco: Never Used  . Tobacco comment: quit 2 yrs ago after smoking for over 20 yrs.  Substance Use Topics  . Alcohol use: Not Currently    Alcohol/week: 2.0 - 3.0 standard drinks    Types: 2 - 3 Glasses of wine per week    Comment: socially  . Drug use: No     Allergies   Iodine; Morphine and related; and Shellfish allergy   Review of Systems Review of Systems ROS: Statement: All systems negative except as marked or noted in the HPI; Constitutional: Negative for fever and chills. ; ; Eyes: Negative for eye pain, redness and discharge. ; ; ENMT: Negative for ear pain, hoarseness, nasal congestion, sinus pressure and sore throat. ; ; Cardiovascular: +CP, SOB. Negative for palpitations, diaphoresis, and peripheral edema. ; ; Respiratory: +cough. Negative for wheezing and stridor. ; ; Gastrointestinal: Negative for nausea, vomiting, diarrhea, abdominal pain, blood in stool, hematemesis, jaundice and rectal bleeding. . ; ; Genitourinary: Negative for dysuria, flank pain and  hematuria. ; ; Musculoskeletal: Negative for back pain and neck pain. Negative for swelling and trauma.; ; Skin: Negative for pruritus, rash, abrasions, blisters, bruising and skin lesion.; ; Neuro: Negative for headache, lightheadedness and neck stiffness. Negative for weakness, altered level of consciousness, altered mental status, extremity weakness, paresthesias, involuntary movement, seizure and syncope.       Physical Exam Updated Vital Signs BP 130/79 (BP Location: Left Arm)   Pulse 77   Temp 99.9 F (37.7 C) (Oral)   Resp 19   Ht 5\' 6"  (1.676 m)   Wt 72.6 kg   SpO2 97%   BMI 25.82 kg/m   Physical  Exam 1305: Physical examination:  Nursing notes reviewed; Vital signs and O2 SAT reviewed;  Constitutional: Well developed, Well nourished, Well hydrated, In no acute distress; Head:  Normocephalic, atraumatic; Eyes: EOMI, PERRL, No scleral icterus; ENMT: Mouth and pharynx normal, Mucous membranes moist; Neck: Supple, Full range of motion, No lymphadenopathy; Cardiovascular: Regular rate and rhythm, No gallop; Respiratory: Breath sounds clear & equal bilaterally, No wheezes.  Speaking full sentences with ease, Normal respiratory effort/excursion; Chest: Nontender, Movement normal; Abdomen: Soft, Nontender, Nondistended, Normal bowel sounds; Genitourinary: No CVA tenderness; Extremities: Peripheral pulses normal, No tenderness, No edema, No calf edema or asymmetry.; Neuro: AA&Ox3, Major CN grossly intact.  Speech clear. No gross focal motor or sensory deficits in extremities.; Skin: Color normal, Warm, Dry.; Psych:  Affect flat.    ED Treatments / Results  Labs (all labs ordered are listed, but only abnormal results are displayed)   EKG EKG Interpretation  Date/Time:  Friday December 11 2017 12:49:58 EST Ventricular Rate:  79 PR Interval:    QRS Duration: 105 QT Interval:  380 QTC Calculation: 436 R Axis:   73 Text Interpretation:  Sinus rhythm Low voltage, precordial leads RSR'  in V1 or V2, right VCD or RVH Nonspecific T abnormalities, anterior leads Baseline wander Artifact When compared with ECG of 10/22/2017 Nonspecific T wave abnormality Anterior leads are now Present Confirmed by Francine Graven [phone]) on 12/11/2017 12:59:42 PM   Radiology   Procedures Procedures (including critical care time)  Medications Ordered in ED Medications  aspirin chewable tablet 324 mg (has no administration in time range)  nitroGLYCERIN (NITROSTAT) SL tablet 0.4 mg (has no administration in time range)  fentaNYL (SUBLIMAZE) injection 50 mcg (has no administration in time range)     Initial Impression / Assessment and Plan / ED Course  I have reviewed the triage vital signs and the nursing notes.  Pertinent labs & imaging results that were available during my care of the patient were reviewed by me and considered in my medical decision making (see chart for details).  MDM Reviewed: previous chart, nursing note and vitals Reviewed previous: labs and ECG Interpretation: labs, ECG, x-ray and CT scan    Results for orders placed or performed during the hospital encounter of 42/70/62  Basic metabolic panel  Result Value Ref Range   Sodium 132 (L) 135 - 145 mmol/L   Potassium 3.8 3.5 - 5.1 mmol/L   Chloride 101 98 - 111 mmol/L   CO2 24 22 - 32 mmol/L   Glucose, Bld 118 (H) 70 - 99 mg/dL   BUN 9 6 - 20 mg/dL   Creatinine, Ser 0.77 0.44 - 1.00 mg/dL   Calcium 8.8 (L) 8.9 - 10.3 mg/dL   GFR calc non Af Amer >60 >60 mL/min   GFR calc Af Amer >60 >60 mL/min   Anion gap 7 5 - 15  Troponin I - Once  Result Value Ref Range   Troponin I <0.03 <0.03 ng/mL  CBC with Differential  Result Value Ref Range   WBC 11.9 (H) 4.0 - 10.5 K/uL   RBC 3.88 3.87 - 5.11 MIL/uL   Hemoglobin 12.3 12.0 - 15.0 g/dL   HCT 38.0 36.0 - 46.0 %   MCV 97.9 80.0 - 100.0 fL   MCH 31.7 26.0 - 34.0 pg   MCHC 32.4 30.0 - 36.0 g/dL   RDW 12.7 11.5 - 15.5 %   Platelets 305 150 - 400 K/uL   nRBC  0.0 0.0 - 0.2 %  Neutrophils Relative % 76 %   Neutro Abs 9.1 (H) 1.7 - 7.7 K/uL   Lymphocytes Relative 18 %   Lymphs Abs 2.1 0.7 - 4.0 K/uL   Monocytes Relative 6 %   Monocytes Absolute 0.7 0.1 - 1.0 K/uL   Eosinophils Relative 0 %   Eosinophils Absolute 0.0 0.0 - 0.5 K/uL   Basophils Relative 0 %   Basophils Absolute 0.0 0.0 - 0.1 K/uL   Immature Granulocytes 0 %   Abs Immature Granulocytes 0.03 0.00 - 0.07 K/uL  Protime-INR  Result Value Ref Range   Prothrombin Time 16.9 (H) 11.4 - 15.2 seconds   INR 1.39   Brain natriuretic peptide  Result Value Ref Range   B Natriuretic Peptide 165.0 (H) 0.0 - 100.0 pg/mL   Dg Chest 1 View  Result Date: 12/11/2017 CLINICAL DATA:  Chest pain and shortness of breath EXAM: CHEST  1 VIEW COMPARISON:  November 09, 2017 FINDINGS: There is consolidation in the right base. There is atelectatic change in the left lower lobe. Heart is mildly enlarged with pulmonary vascularity normal. No adenopathy. No bone lesions. No pneumothorax. IMPRESSION: Consolidation right base, suspicious for focal pneumonia. Patchy atelectasis left lower lobe. Lungs elsewhere clear. Mild cardiac prominence. No adenopathy evident. Electronically Signed   By: Lowella Grip III M.D.   On: 12/11/2017 13:47    Ct Angio Chest Pe W/cm &/or Wo Cm Result Date: 12/11/2017 CLINICAL DATA:  Chest pain, shortness of breath. EXAM: CT ANGIOGRAPHY CHEST WITH CONTRAST TECHNIQUE: Multidetector CT imaging of the chest was performed using the standard protocol during bolus administration of intravenous contrast. Multiplanar CT image reconstructions and MIPs were obtained to evaluate the vascular anatomy. CONTRAST:  62mL ISOVUE-370 IOPAMIDOL (ISOVUE-370) INJECTION 76% COMPARISON:  CT scan of October 19, 2017. FINDINGS: Cardiovascular: Satisfactory opacification of the pulmonary arteries to the segmental level. No evidence of pulmonary embolism. Normal heart size. Moderate pericardial effusion is  noted. Mediastinum/Nodes: No enlarged mediastinal, hilar, or axillary lymph nodes. Thyroid gland, trachea, and esophagus demonstrate no significant findings. Lungs/Pleura: No pneumothorax is noted. Mild bibasilar subsegmental atelectasis is noted with small associated pleural effusions. Upper Abdomen: No acute abnormality. Musculoskeletal: No chest wall abnormality. No acute or significant osseous findings. Review of the MIP images confirms the above findings. IMPRESSION: No definite evidence of pulmonary embolus. Moderate size pericardial effusion is noted. Mild bibasilar subsegmental atelectasis is noted with small associated pleural effusions. Electronically Signed   By: Marijo Conception, M.D.   On: 12/11/2017 15:55     1555:  Troponin reassuring after 4 days of constant atypical symptoms. T/C returned from Cards Dr. Johnsie Cancel, case discussed, including:  HPI, pertinent PM/SHx, VS/PE, dx testing, ED course and treatment:  States pt had normal coronary arteries via cath performed before her ASD repair, today's troponin is normal and EKG unchanged from previous, CXR suspicious for pneumonia and will need tx, states symptoms today not cardiac related and no further cardiac workup is needed in ED at this time, CT finding of pericardial effusion likely due to over-estimation which is common for CT modality, he will notify office that pt will need f/u for outpatient echocardiogram.   1700:  Pt ambulated to bathroom with O2 Sats 93%< on R/A, resps easy, NAD. CXR suspicious for pneumonia and pt c/o cough; will tx abx. No clear indication for admission at this time.  Dx and testing, as well as d/w Cards MD, d/w pt and family.  Questions answered.  Verb understanding, agreeable to  d/c home with outpt f/u.     Final Clinical Impressions(s) / ED Diagnoses   Final diagnoses:  None    ED Discharge Orders    None       Francine Graven, DO 12/13/17 2128

## 2017-12-11 NOTE — ED Triage Notes (Signed)
Patient complaining of chest pain x 4 days with shortness of breath. States she had heart surgery on oct 2 to repair a hole in her heart.

## 2017-12-14 ENCOUNTER — Other Ambulatory Visit: Payer: Self-pay | Admitting: Thoracic Surgery (Cardiothoracic Vascular Surgery)

## 2017-12-14 ENCOUNTER — Ambulatory Visit (INDEPENDENT_AMBULATORY_CARE_PROVIDER_SITE_OTHER): Payer: Self-pay | Admitting: Physician Assistant

## 2017-12-14 ENCOUNTER — Ambulatory Visit
Admission: RE | Admit: 2017-12-14 | Discharge: 2017-12-14 | Disposition: A | Discharge status: Home / Self Care | Payer: BLUE CROSS/BLUE SHIELD | Source: Ambulatory Visit | Attending: Thoracic Surgery (Cardiothoracic Vascular Surgery) | Admitting: Thoracic Surgery (Cardiothoracic Vascular Surgery)

## 2017-12-14 VITALS — BP 116/92 | HR 79 | Temp 99.2°F | Resp 20 | Ht 66.0 in | Wt 171.0 lb

## 2017-12-14 DIAGNOSIS — R079 Chest pain, unspecified: Secondary | ICD-10-CM

## 2017-12-14 DIAGNOSIS — Z8774 Personal history of (corrected) congenital malformations of heart and circulatory system: Secondary | ICD-10-CM

## 2017-12-14 MED ORDER — TRAMADOL HCL 50 MG PO TABS: 50.0000 mg | ORAL_TABLET | Freq: Four times a day (QID) | ORAL | 0 refills | Status: DC | PRN

## 2017-12-14 NOTE — Telephone Encounter (Signed)
Patient informed and verbalized understanding of plan. 

## 2017-12-14 NOTE — Telephone Encounter (Signed)
Covering for Dr. Bronson Ing. Chart reviewed. ED course reassuring, CT showed possible focal pneumonia? Troponin negative, likelihood of MI very low given normal coronaries on recent cath. 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 Patient underwent ASD repair in Oct 2019. Given reassuring ED workup, ok to followup with Estella Husk. During the mean time, she is need to be seen by PCP

## 2017-12-14 NOTE — Progress Notes (Signed)
Kristy Lewis,Kristy Lewis             [phone]      Kristy Lewis is a 57 y.o. female patient who presents today for follow-up from her emergency department visit on Friday.  She had increasing shortness of breath and chest pain therefore she went to the emergency department.  Her troponin level was negative and her EKG was stable.  They diagnosed her with pneumonia and sent her home on doxycycline 100 mg twice daily for 7 days.  She returns to our clinic for follow-up.   1. 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 Anginal pain ([place])    Comes and goes, pt states its normal to have every day   . ASD (atrial septal defect), sinus venosus defect   . Bipolar disorder ([place])    pt denies  . Cardiac murmur   . Carpal tunnel syndrome   . Depressive disorder   . Dysphagia   . Dyspnea    Upon exertion  . Esophageal reflux   . Genital herpes    at age 45  . Hypercholesteremia   . Migraines   . 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 Urinary incontinence    No past surgical history pertinent negatives on file. Scheduled Meds: Current Outpatient Medications on File Prior to Visit  Medication Sig Dispense Refill  . acetaminophen (TYLENOL) 500 MG tablet Take 2 tablets (1,000 mg total) by mouth every 6 (six) hours as needed. 30 tablet 0  . bisacodyl (DULCOLAX) 5 MG EC tablet Take 5 mg by mouth daily as needed for moderate constipation.    . cyclobenzaprine (FLEXERIL) 5 MG tablet Take 1 tablet (5 mg total) by mouth 3 (three) times daily as needed for muscle spasms. 30 tablet 0  . diazepam (VALIUM) 2 MG tablet Take 2 mg by mouth 2 (two) times daily as needed for anxiety.     . docusate sodium (COLACE) 100 MG capsule Take 100 mg by mouth daily.    Marland Kitchen doxycycline  (VIBRA-TABS) 100 MG tablet Take 1 tablet (100 mg total) by mouth 2 (two) times daily. 14 tablet 0  . FLUoxetine (PROZAC) 40 MG capsule Take 40 mg by mouth daily.     . metoprolol tartrate (LOPRESSOR) 25 MG tablet Take 0.5 tablets (12.5 mg total) by mouth 2 (two) times daily. 60 tablet 3  . Multiple Vitamin (MULTIVITAMIN WITH MINERALS) TABS tablet Take 1 tablet by mouth daily.    . nitroGLYCERIN (NITROSTAT) 0.4 MG SL tablet Place 1 tablet (0.4 mg total) under the tongue every 5 (five) minutes as needed for chest pain. 30 tablet 0  . warfarin (COUMADIN) 5 MG tablet Take 1 tablet (5 mg total) by mouth daily at 6 PM. 30 tablet 3   No current facility-administered medications on file prior to visit.     Allergies  Allergen Reactions  . Iodine Swelling and Rash    SWELLING REACTION UNSPECIFIED   . Morphine And Related Itching and Other (See Comments)    Altered mental status "mean"  . Shellfish Allergy Swelling and Rash    SWELLING REACTION UNSPECIFIED    Active Problems:   * No active hospital problems. *  Blood pressure (!) 116/92, pulse 79, temperature 99.2  F (37.3 C), temperature source Oral, resp. rate 20, height 5\' 6"  (1.676 m), weight 171 lb (77.6 kg), SpO2 96 %.  Subjective  Kristy Lewis is a 57 year old female status post minimally invasive repair of sinus venosus type atrial septal defect with partial anomalous pulmonary venous return.  Initially postop she was doing well.  She did not have any shortness of breath.  Her pain today is about the same as it has been since admission.  Objective  Cor: RRR, no murmur Pulm: CTA bilaterally and in all fields Abd: no tenderness Wound: c/d/i  Ext: no edema   CLINICAL DATA:  Follow-up consolidation at the right base  EXAM: CHEST - 2 Lewis  COMPARISON:  Base 12/11/2017  FINDINGS: Band of opacity at the right more than left bases consistent with atelectasis. There is also volume loss on the right with diaphragm elevation.  Cardiopericardial enlargement in the setting of known pericardial effusion.  IMPRESSION: 1. Atelectasis at the right more than left base. 2. Known pericardial effusion with stable cardiopericardial size.   Electronically Signed   By: Kristy Lewis M.D.   On: 12/14/2017 15:18  Assessment & Plan   Today, Kristy Lewis is feeling fatigued and states that she is more short of breath with activity.  She is having some incisional pain which radiates across her chest and into her back.  She went to the emergency department on Friday and her chest pain work-up was negative for MI.  She also had a CT scan of the chest at that time which was negative for PE.  I think the next step would be to schedule an echocardiogram for further evaluation.  She does not appear particularly fluid overloaded on exam and is in normal sinus rhythm today.  Her INR is subtherapeutic but her Coumadin has been increased.  The chest x-ray today showed a known stable pericardial effusion and on CT it was labeled as a moderate pericardial effusion.  She does not have any signs or symptoms of [place] on exam today.  She was diagnosed with pneumonia while in the emergency department and given doxycycline 100 mg twice daily.  She does have a cough today but no fever.  Temperature is 99.2 F.  Her appetite has been good up until Thursday of last week when she started having increased pain and shortness of breath.  She says it has not picked up really since then.  She is scheduled for an echocardiogram on 12/24/2016.  She has a follow-up appointment with Kristy Galas, PA-C on 12/28/2017.  If she is in need of our office before her next appointment with [place] in late December she is to call and make an appointment.  Otherwise, she will follow-up with Dr. [name] at the end of December for her 40-month follow-up appointment.  New prescriptions: I did provide her with another prescription for tramadol 50 mg every 6 hours.  Dispense:  30#  Elgie Collard 12/14/2017

## 2017-12-14 NOTE — Patient Instructions (Addendum)
You are encouraged to enroll and participate in the outpatient cardiac rehab program beginning as soon as practical.  You may return to driving an automobile as long as you are no longer requiring oral narcotic pain relievers during the daytime.  It would be wise to start driving only short distances during the daylight and gradually increase from there as you feel comfortable.  Make every effort to stay physically active, get some type of exercise on a regular basis, and stick to a "heart healthy diet".  The long term benefits for regular exercise and a healthy diet are critically important to your overall health and wellbeing.  Echocardiogram scheduled for 12/24/2017.  Follow-up with cardiology scheduled for 12/28/2017.  Please call our office if there are any changes.

## 2017-12-21 ENCOUNTER — Encounter: Payer: Self-pay | Admitting: Cardiovascular Disease

## 2017-12-21 ENCOUNTER — Ambulatory Visit (INDEPENDENT_AMBULATORY_CARE_PROVIDER_SITE_OTHER): Payer: BLUE CROSS/BLUE SHIELD | Admitting: Cardiovascular Disease

## 2017-12-21 VITALS — BP 94/56 | HR 76 | Ht 66.0 in | Wt 172.2 lb

## 2017-12-21 DIAGNOSIS — I959 Hypotension, unspecified: Secondary | ICD-10-CM

## 2017-12-21 DIAGNOSIS — I3139 Other pericardial effusion (noninflammatory): Secondary | ICD-10-CM

## 2017-12-21 DIAGNOSIS — R079 Chest pain, unspecified: Secondary | ICD-10-CM | POA: Diagnosis not present

## 2017-12-21 DIAGNOSIS — R0609 Other forms of dyspnea: Secondary | ICD-10-CM | POA: Diagnosis not present

## 2017-12-21 DIAGNOSIS — Z8774 Personal history of (corrected) congenital malformations of heart and circulatory system: Secondary | ICD-10-CM | POA: Diagnosis not present

## 2017-12-21 DIAGNOSIS — R5383 Other fatigue: Secondary | ICD-10-CM

## 2017-12-21 DIAGNOSIS — Q249 Congenital malformation of heart, unspecified: Secondary | ICD-10-CM

## 2017-12-21 DIAGNOSIS — I313 Pericardial effusion (noninflammatory): Secondary | ICD-10-CM

## 2017-12-21 DIAGNOSIS — I10 Essential (primary) hypertension: Secondary | ICD-10-CM

## 2017-12-21 NOTE — Patient Instructions (Addendum)
Medication Instructions:   Stop Lopressor.   Continue all other medications.    Labwork: none  Testing/Procedures: none  Follow-Up: Keep follow up that is already scheduled.    Any Other Special Instructions Will Be Listed Below (If Applicable).  If you need a refill on your cardiac medications before your next appointment, please call your pharmacy.

## 2017-12-21 NOTE — Progress Notes (Signed)
SUBJECTIVE: The patient presents for evaluation of fatigue.  She was evaluated for chest pain in the ED on 12/11/2017.  Chest CT showed moderate sized pericardial effusion and mild bibasilar subsegmental atelectasis with small associated pleural effusions.  She was started on doxycycline 100 mg twice daily.  She has a follow-up echocardiogram ordered.  This is scheduled for 12/24/2017.  She has been having right sided chest pains and describes a "raw sensation ".  Upon further discussion, it appears it is worse with deep breathing.  She has had a cough with clear sputum production.  Maximum temperature recorded at home has been 99.  She has had chills.  She feels like she may pass out if she stands up too quickly.  She has had exertional dyspnea but feels like this has improved to some degree after finishing a course of doxycycline.  She denies leg swelling, orthopnea, and paroxysmal nocturnal dyspnea.  She has had occasional palpitations.     Review of Systems: As per "subjective", otherwise negative.  Allergies  Allergen Reactions  . Iodine Swelling and Rash    SWELLING REACTION UNSPECIFIED   . Morphine And Related Itching and Other (See Comments)    Altered mental status "mean"  . Shellfish Allergy Swelling and Rash    SWELLING REACTION UNSPECIFIED     Current Outpatient Medications  Medication Sig Dispense Refill  . acetaminophen (TYLENOL) 500 MG tablet Take 2 tablets (1,000 mg total) by mouth every 6 (six) hours as needed. 30 tablet 0  . bisacodyl (DULCOLAX) 5 MG EC tablet Take 5 mg by mouth daily as needed for moderate constipation.    . cyclobenzaprine (FLEXERIL) 5 MG tablet Take 1 tablet (5 mg total) by mouth 3 (three) times daily as needed for muscle spasms. 30 tablet 0  . diazepam (VALIUM) 2 MG tablet Take 2 mg by mouth 2 (two) times daily as needed for anxiety.     . docusate sodium (COLACE) 100 MG capsule Take 100 mg by mouth daily.    Marland Kitchen FLUoxetine (PROZAC) 40  MG capsule Take 40 mg by mouth daily.     . metoprolol tartrate (LOPRESSOR) 25 MG tablet Take 0.5 tablets (12.5 mg total) by mouth 2 (two) times daily. 60 tablet 3  . Multiple Vitamin (MULTIVITAMIN WITH MINERALS) TABS tablet Take 1 tablet by mouth daily.    . nitroGLYCERIN (NITROSTAT) 0.4 MG SL tablet Place 1 tablet (0.4 mg total) under the tongue every 5 (five) minutes as needed for chest pain. 30 tablet 0  . traMADol (ULTRAM) 50 MG tablet Take 1 tablet (50 mg total) by mouth every 6 (six) hours as needed for moderate pain. 30 tablet 0  . warfarin (COUMADIN) 5 MG tablet Take 1 tablet (5 mg total) by mouth daily at 6 PM. 30 tablet 3   No current facility-administered medications for this visit.     Past Medical History:  Diagnosis Date  . Anginal pain ([place])    Comes and goes, pt states its normal to have every day   . ASD (atrial septal defect), sinus venosus defect   . Bipolar disorder ([place])    pt denies  . Cardiac murmur   . Carpal tunnel syndrome   . Depressive disorder   . Dysphagia   . Dyspnea    Upon exertion  . Esophageal reflux   . Genital herpes    at age 22  . Hypercholesteremia   . Migraines   . 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 Urinary incontinence     Past Surgical History:  Procedure Laterality Date  . ABDOMINAL HYSTERECTOMY     with bladder surgery. Ovaries remained  . ASD REPAIR N/A 10/21/2017   Procedure: MINIMALLY INVASIVE REPAIR OF SINUS VENOSUS ATRIAL SEPTAL DEFECT (ASD), CORRECTION OF PARTIAL ANOMALOUS PULMONARY VENOUS RETURN, CLOSURE OF PATENT FORAMEN OVALE;  Surgeon: Rexene Alberts, MD;  Location: [facility];  Service: Open Heart Surgery;  Laterality: N/A;  . COLONOSCOPY  10/15/2011   Procedure: COLONOSCOPY;  Surgeon: Rogene Houston, MD;  [facility];  Service: Endoscopy;  Laterality: N/A;  200  . HEMORRHOID SURGERY    . left shoulder manipulation    . RIGHT/LEFT  HEART CATH AND CORONARY ANGIOGRAPHY N/A 08/05/2017   Procedure: RIGHT/LEFT HEART CATH AND CORONARY ANGIOGRAPHY;  Surgeon: Belva Crome, MD;  Location: [facility];  Service: Cardiovascular;  Laterality: N/A;  . TEE WITHOUT CARDIOVERSION N/A 10/21/2017   Procedure: TRANSESOPHAGEAL ECHOCARDIOGRAM (TEE);  Surgeon: Rexene Alberts, MD;  Location: [facility];  Service: Open Heart Surgery;  Laterality: N/A;  . TONSILLECTOMY      Social History   Socioeconomic History  . Marital status: Divorced    Spouse name: Not on file  . Number of children: Not on file  . Years of education: Not on file  . Highest education level: Not on file  Occupational History  . Not on file  Social Needs  . Financial resource strain: Not on file  . Food insecurity:    Worry: Not on file    Inability: Not on file  . Transportation needs:    Medical: Not on file    Non-medical: Not on file  Tobacco Use  . Smoking status: Former Smoker    Packs/day: 0.50    Last attempt to quit: 08/05/2017    Years since quitting: 0.3  . Smokeless tobacco: Never Used  . Tobacco comment: quit 2 yrs ago after smoking for over 20 yrs.  Substance and Sexual Activity  . Alcohol use: Not Currently    Alcohol/week: 2.0 - 3.0 standard drinks    Types: 2 - 3 Glasses of wine per week    Comment: socially  . Drug use: No  . Sexual activity: Not on file  Lifestyle  . Physical activity:    Days per week: Not on file    Minutes per session: Not on file  . Stress: Not on file  Relationships  . Social connections:    Talks on phone: Not on file    Gets together: Not on file    Attends religious service: Not on file    Active member of club or organization: Not on file    Attends meetings of clubs or organizations: Not on file    Relationship status: Not on file  . Intimate partner violence:    Fear of current or ex partner: Not on file    Emotionally abused: Not on file    Physically abused: Not on file    Forced sexual  activity: Not on file  Other Topics Concern  . Not on file  Social History Narrative  . Not on file     Vitals:   12/21/17 1347 12/21/17 1350  BP: (!) 88/59 (!) 94/56  Pulse: 68 76  SpO2: 96% 96%  Weight: 172 lb 3.2 oz (78.1 kg)   Height: 5\' 6"  (1.676 m)  Wt Readings from Last 3 Encounters:  12/21/17 172 lb 3.2 oz (78.1 kg)  12/14/17 171 lb (77.6 kg)  12/11/17 160 lb (72.6 kg)     PHYSICAL EXAM General: NAD HEENT: Normal. Neck: No JVD, no thyromegaly. Lungs: Clear to auscultation bilaterally with normal respiratory effort. CV: Regular rate and rhythm, normal S1/S2, no S3/S4, no murmur. No pretibial or periankle edema.  No carotid bruit.   Abdomen: Soft, nontender, no distention.  Neurologic: Alert and oriented.  Psych: Normal affect. Skin: Normal. Musculoskeletal: No gross deformities.    ECG: Reviewed above under Subjective   Labs: Lab Results  Component Value Date/Time   K 3.8 12/11/2017 12:52 PM   BUN 9 12/11/2017 12:52 PM   CREATININE 0.77 12/11/2017 12:52 PM   ALT 17 10/19/2017 10:48 AM   HGB 12.3 12/11/2017 12:56 PM     Lipids: No results found for: LDLCALC, LDLDIRECT, CHOL, TRIG, HDL     ASSESSMENT AND PLAN:  1.  Fatigue with exertional dyspnea: She is hypotensive with a moderate sized pericardial effusion by recent chest CT.  An echocardiogram has already been ordered and is scheduled for 12/24/2017.  I will stop metoprolol.  2.  ASD repair/PFO closure: She underwent minimally invasive repair of the sinus venosus ASD, correction of partial anomalous pulmonary venous return, and closure of a PFO on 10/21/2017.   I will obtain a follow-up echocardiogram for reasons mentioned above.  3.  Hypertension: Blood pressure is low.  I will stop metoprolol.  4.  Chest pain: This appears to be pleuritic in etiology and likely the residual effect of pneumonia.  Unclear if she also has some associated pericarditis.  I will review the echocardiogram to be  performed later this week.  5. Pericardial effusion: Moderate in size by recent chest CT.  Echocardiogram ordered for 12/24/2017.   Disposition: Follow up with Gerrianne Scale PA-C on 12/28/2017 as previously scheduled.   Kate Sable, M.D., F.A.C.C.

## 2017-12-22 DIAGNOSIS — I313 Pericardial effusion (noninflammatory): Secondary | ICD-10-CM | POA: Insufficient documentation

## 2017-12-22 DIAGNOSIS — I3139 Other pericardial effusion (noninflammatory): Secondary | ICD-10-CM | POA: Insufficient documentation

## 2017-12-22 NOTE — Progress Notes (Signed)
Cardiology Office Note    Date:  12/28/2017   ID:  Kristy Lewis, DOB [birth year], MRN [id number]  PCP:  Monico Blitz, MD  Cardiologist: Kate Sable, MD EPS: None  No chief complaint on file.   History of Present Illness:  Kristy Lewis is a 57 y.o. 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  Patient went to the ER 12/11/2017 with recurrent chest pain and CT was negative for PE but she had a moderate pericardial effusion.  Chest x-ray showed a stable pericardial effusion.  Also diagnosed with pneumonia and put on doxycycline  Patient was seen by Dr. Bronson Ing 12/21/2017 with fatigue and exertional dyspnea.  She was hypotensive and had a moderate sized pericardial effusion by recent chest CT.  Echo performed 12/24/2017 that showed normal LV function EF 55 to 60% RV mildly dilated with systolic function mildly reduced, ASD repair noted moderate pericardial effusion circumferential to the heart and a moderate to large collection anteriorly.  No obvious RV compromise.  Dr. Bronson Ing recommended follow-up limited echo in 6 weeks to reassess he stopped metoprolol.  Patient comes in today for follow-up.  She continues to develop chest tightness and shortness of breath with very little activity such as walking, preparing dinner or making her bed.  She has to stop and rest before it goes away.  She is very frustrated.  She says she is tried to push herself but continues to feel poorly.  She feels marginally better than when she saw Dr. Bronson Ing last week.  Past Medical History:  Diagnosis Date  . Anginal pain ([place])    Comes and goes, pt states its normal to have every day   . ASD (atrial septal defect), sinus venosus defect   . Bipolar disorder ([place])    pt denies  . Cardiac murmur   . Carpal tunnel syndrome   . Depressive disorder   . Dysphagia   . Dyspnea    Upon exertion  .  Esophageal reflux   . Genital herpes    at age 29  . Hypercholesteremia   . Migraines   . 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 Urinary incontinence     Past Surgical History:  Procedure Laterality Date  . ABDOMINAL HYSTERECTOMY     with bladder surgery. Ovaries remained  . ASD REPAIR N/A 10/21/2017   Procedure: MINIMALLY INVASIVE REPAIR OF SINUS VENOSUS ATRIAL SEPTAL DEFECT (ASD), CORRECTION OF PARTIAL ANOMALOUS PULMONARY VENOUS RETURN, CLOSURE OF PATENT FORAMEN OVALE;  Surgeon: Rexene Alberts, MD;  Location: [facility];  Service: Open Heart Surgery;  Laterality: N/A;  . COLONOSCOPY  10/15/2011   Procedure: COLONOSCOPY;  Surgeon: Rogene Houston, MD;  [facility];  Service: Endoscopy;  Laterality: N/A;  200  . HEMORRHOID SURGERY    . left shoulder manipulation    . RIGHT/LEFT HEART CATH AND CORONARY ANGIOGRAPHY N/A 08/05/2017   Procedure: RIGHT/LEFT HEART CATH AND CORONARY ANGIOGRAPHY;  Surgeon: Belva Crome, MD;  Location: [facility];  Service: Cardiovascular;  Laterality: N/A;  . TEE WITHOUT CARDIOVERSION N/A 10/21/2017   Procedure: TRANSESOPHAGEAL ECHOCARDIOGRAM (TEE);  Surgeon: Rexene Alberts, MD;  Location: [facility];  Service: Open Heart Surgery;  Laterality: N/A;  . TONSILLECTOMY      Current Medications: Current Meds  Medication Sig  . acetaminophen (TYLENOL) 500 MG tablet Take 2 tablets (1,000  mg total) by mouth every 6 (six) hours as needed.  . bisacodyl (DULCOLAX) 5 MG EC tablet Take 5 mg by mouth daily as needed for moderate constipation.  . cyclobenzaprine (FLEXERIL) 5 MG tablet Take 1 tablet (5 mg total) by mouth 3 (three) times daily as needed for muscle spasms.  . diazepam (VALIUM) 2 MG tablet Take 2 mg by mouth 2 (two) times daily as needed for anxiety.   . docusate sodium (COLACE) 100 MG capsule Take 100 mg by mouth daily.  Marland Kitchen FLUoxetine (PROZAC) 40 MG capsule Take 40 mg by  mouth daily.   . Multiple Vitamin (MULTIVITAMIN WITH MINERALS) TABS tablet Take 1 tablet by mouth daily.  . nitroGLYCERIN (NITROSTAT) 0.4 MG SL tablet Place 1 tablet (0.4 mg total) under the tongue every 5 (five) minutes as needed for chest pain.  . traMADol (ULTRAM) 50 MG tablet Take 1 tablet (50 mg total) by mouth every 6 (six) hours as needed for moderate pain.  Marland Kitchen warfarin (COUMADIN) 5 MG tablet Take 1 tablet (5 mg total) by mouth daily at 6 PM.     Allergies:   Iodine; Morphine and related; and Shellfish allergy   Social History   Socioeconomic History  . Marital status: Divorced    Spouse name: Not on file  . Number of children: Not on file  . Years of education: Not on file  . Highest education level: Not on file  Occupational History  . Not on file  Social Needs  . Financial resource strain: Not on file  . Food insecurity:    Worry: Not on file    Inability: Not on file  . Transportation needs:    Medical: Not on file    Non-medical: Not on file  Tobacco Use  . Smoking status: Former Smoker    Packs/day: 0.50    Last attempt to quit: 08/05/2017    Years since quitting: 0.3  . Smokeless tobacco: Never Used  . Tobacco comment: quit 2 yrs ago after smoking for over 20 yrs.  Substance and Sexual Activity  . Alcohol use: Not Currently    Alcohol/week: 2.0 - 3.0 standard drinks    Types: 2 - 3 Glasses of wine per week    Comment: socially  . Drug use: No  . Sexual activity: Not on file  Lifestyle  . Physical activity:    Days per week: Not on file    Minutes per session: Not on file  . Stress: Not on file  Relationships  . Social connections:    Talks on phone: Not on file    Gets together: Not on file    Attends religious service: Not on file    Active member of club or organization: Not on file    Attends meetings of clubs or organizations: Not on file    Relationship status: Not on file  Other Topics Concern  . Not on file  Social History Narrative  . Not  on file     Family History:  The patient's family history includes Hypertension in her mother.   ROS:   Please see the history of present illness.    Review of Systems  Constitution: Positive for malaise/fatigue.  HENT: Positive for hoarse voice.   Eyes: Negative.   Cardiovascular: Positive for chest pain and dyspnea on exertion.  Respiratory: Positive for cough.   Hematologic/Lymphatic: Negative.   Musculoskeletal: Negative.  Negative for joint pain.  Gastrointestinal: Negative.   Genitourinary: Negative.   Neurological:  Negative.    All other systems reviewed and are negative.   PHYSICAL EXAM:   VS:  BP 120/80   Pulse 89   Ht 5\' 6"  (1.676 m)   Wt 174 lb 6.4 oz (79.1 kg)   SpO2 98% Comment: on room air  BMI 28.15 kg/m   Physical Exam  GEN: Well nourished, well developed, in no acute distress  Neck: no JVD, carotid bruits, or masses Cardiac:RRR; no murmurs, rubs, or gallops  Respiratory:  clear to auscultation bilaterally, normal work of breathing GI: soft, nontender, nondistended, + BS Ext: without cyanosis, clubbing, or edema, Good distal pulses bilaterally Neuro:  Alert and Oriented x 3 Psych: euthymic mood, full affect  Wt Readings from Last 3 Encounters:  12/28/17 174 lb 6.4 oz (79.1 kg)  12/21/17 172 lb 3.2 oz (78.1 kg)  12/14/17 171 lb (77.6 kg)      Studies/Labs Reviewed:   EKG:  EKG is not ordered today.   Recent Labs: 10/19/2017: ALT 17 10/22/2017: Magnesium 2.4 12/11/2017: B Natriuretic Peptide 165.0; BUN 9; Creatinine, Ser 0.77; Hemoglobin 12.3; Platelets 305; Potassium 3.8; Sodium 132   Lipid Panel No results found for: CHOL, TRIG, HDL, CHOLHDL, VLDL, LDLCALC, LDLDIRECT  Additional studies/ records that were reviewed today include:   2D echo 12/5/2019Study Conclusions   - Left ventricle: The cavity size was normal. Wall thickness was   increased in a pattern of mild LVH. Systolic function was normal.   The estimated ejection fraction was in  the range of 55% to 60%.   Wall motion was normal; there were no regional wall motion   abnormalities. Indeterminate diastolic function. - Right ventricle: The cavity size was mildly dilated. Systolic   function was mildly reduced. - Atrial septum: Patient status post minimally invasive repair of   sinus venosus ASD, correction of partial anomalous pulmonary   venous return, and closure of a PFO. No significant residual   shunt noted. - Tricuspid valve: There was physiologic regurgitation. - Pulmonary arteries: Systolic pressure could not be accurately   estimated. - Pericardium, extracardiac: A moderate pericardial effusion was   identified circumferential to the heart with evidence of   organization apically and a moderate to large collection   anteriorly. No obvious RV compromise and the respiratory   variation in mitral outflow does not suggest tamponade   physiology.    Chest x-ray 11/25/2019IMPRESSION: 1. Atelectasis at the right more than left base. 2. Known pericardial effusion with stable cardiopericardial size.     Electronically Signed   By: Monte Fantasia M.D.   On: 12/14/2017 15:18   CT angio 11/22/19IMPRESSION: No definite evidence of pulmonary embolus.   Moderate size pericardial effusion is noted.   Mild bibasilar subsegmental atelectasis is noted with small associated pleural effusions.     Electronically Signed   By: Marijo Conception, M.D.   On: 12/11/2017 15:55   Echo TEE 10/2/19Conclusions   Result status: Final result   Septum: Large sinus venosus interatrial septal defect present with left to right shunting visualized by color doppler. Small Patent Foramen Ovale present with left to right shunt visualized by color doppler.  Left atrium: Patent foramen ovale present with left to right shunting indicated by color flow Doppler. Large sinus venosus interatrial septal defect present with left to right shunting indicated by color flow Doppler.  Right  ventricle: Normal wall thickness and ejection fraction. Cavity is severely dilated.  Tricuspid valve: Mild regurgitation. The tricuspid valve regurgitation jet is central.  Pericardium: Trivial pericardial effusion.  Pulmonary artery: Pulmonary artery is dilated.  Mitral valve: No leaflet thickening and calcification present. Mild mitral annular calcification.  Right atrium: Cavity is dilated.  Aortic valve: The valve is trileaflet. No stenosis. No regurgitation.       Cardiac cath 7/2019Normal coronary arteries.  Normal LV size and function.  Normal pulmonary artery pressures.  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    Qp/Qs greater than 2.7:1 (using IVC O2 sat as mixed venous in calculation).   RECOMMENDATIONS:    Needs to have morphology identified to determine pulmonary vein drainage, and exclude ASD.  Discussed morphologic evaluation with Dr. Ena Dawley and the next step should be cardiac CT Angio.    No indication for antiplatelet therapy at this time.   ASSESSMENT:    1. Chest pain, unspecified type   2. S/P minimally invasive atrial septal defect closure + repair partial anomalous pulmonary venous return   3. Pericardial effusion      PLAN:  In order of problems listed above:  Chest pain and fatigue with recent CT showing moderate sized pericardial effusion after minimally invasive ASD closure and repair of partial anomalous pulmonary vein return.  She was treated for pneumonia.  Pericardial effusion 2D echo showed moderate pericardial effusion with evidence of organized apical and moderate to large collection anteriorly.  Discussed ongoing symptoms with Dr. Bronson Ing who is going to have Dr. Burt Knack review and decide whether pericardiocentesis is indicated at this time.  Discussed with patient  in detail.  Our office will be in touch with her.  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  Concerns regarding medicines are outlined above.  Medication changes, Labs and Tests ordered today are listed in the Patient Instructions below. There are no Patient Instructions on file for this visit.   Sumner Boast, PA-C  12/28/2017 11:10 AM    [facility] [place], [place], [place]  [address] Phone: [phone]; Fax: [phone]

## 2017-12-24 ENCOUNTER — Ambulatory Visit (HOSPITAL_COMMUNITY)
Admission: RE | Admit: 2017-12-24 | Discharge: 2017-12-24 | Disposition: A | Discharge status: Home / Self Care | Payer: BLUE CROSS/BLUE SHIELD | Source: Ambulatory Visit | Attending: Cardiovascular Disease | Admitting: Cardiovascular Disease

## 2017-12-24 DIAGNOSIS — Q211 Atrial septal defect, unspecified: Secondary | ICD-10-CM

## 2017-12-24 NOTE — Progress Notes (Signed)
*  PRELIMINARY RESULTS* Echocardiogram 2D Echocardiogram has been performed.  Kristy Lewis 12/24/2017, 1:07 PM

## 2017-12-25 ENCOUNTER — Ambulatory Visit (INDEPENDENT_AMBULATORY_CARE_PROVIDER_SITE_OTHER): Payer: BLUE CROSS/BLUE SHIELD | Admitting: *Deleted

## 2017-12-25 DIAGNOSIS — Q211 Atrial septal defect, unspecified: Secondary | ICD-10-CM

## 2017-12-25 DIAGNOSIS — Z5181 Encounter for therapeutic drug level monitoring: Secondary | ICD-10-CM | POA: Diagnosis not present

## 2017-12-25 LAB — POCT INR: INR: 2.6 (ref 2.0–3.0)

## 2017-12-25 NOTE — Patient Instructions (Signed)
Continue coumadin 1 tablet daily except 1/2 tablet on Tuesdays and Saturdays. Recheck in 3 weeks

## 2017-12-28 ENCOUNTER — Telehealth: Payer: Self-pay | Admitting: Physician Assistant

## 2017-12-28 ENCOUNTER — Ambulatory Visit: Payer: Self-pay | Admitting: *Deleted

## 2017-12-28 ENCOUNTER — Encounter: Payer: Self-pay | Admitting: Physician Assistant

## 2017-12-28 ENCOUNTER — Ambulatory Visit (INDEPENDENT_AMBULATORY_CARE_PROVIDER_SITE_OTHER): Payer: BLUE CROSS/BLUE SHIELD | Admitting: Physician Assistant

## 2017-12-28 ENCOUNTER — Encounter (HOSPITAL_COMMUNITY): Payer: BLUE CROSS/BLUE SHIELD

## 2017-12-28 VITALS — BP 120/80 | HR 89 | Ht 66.0 in | Wt 174.4 lb

## 2017-12-28 DIAGNOSIS — I313 Pericardial effusion (noninflammatory): Secondary | ICD-10-CM

## 2017-12-28 DIAGNOSIS — Z8774 Personal history of (corrected) congenital malformations of heart and circulatory system: Secondary | ICD-10-CM | POA: Diagnosis not present

## 2017-12-28 DIAGNOSIS — R079 Chest pain, unspecified: Secondary | ICD-10-CM | POA: Diagnosis not present

## 2017-12-28 DIAGNOSIS — I3139 Other pericardial effusion (noninflammatory): Secondary | ICD-10-CM

## 2017-12-28 NOTE — Telephone Encounter (Signed)
Patient informed. 

## 2017-12-28 NOTE — Patient Instructions (Addendum)
Medication Instructions:   Your physician recommends that you continue on your current medications as directed. Please refer to the Current Medication list given to you today.  Labwork:  NONE  Testing/Procedures:  NONE  Follow-Up:  Your physician recommends that you schedule a follow-up appointment in: February 2020.  Any Other Special Instructions Will Be Listed Below (If Applicable).  We will contact your later on today about correspondence with Dr. Burt Knack.  If you need a refill on your cardiac medications before your next appointment, please call your pharmacy.

## 2017-12-28 NOTE — Addendum Note (Signed)
Addended by: Merlene Laughter on: 12/28/2017 04:00 PM   Modules accepted: Orders

## 2017-12-28 NOTE — Telephone Encounter (Signed)
-----   Message from Herminio Commons, MD sent at 12/28/2017  3:31 PM EST ----- Regarding: FW: Pericardial effusion See below. Go ahead and stop warfarin and repeat a limited echo in 3 weeks.  ----- Message ----- From: Rexene Alberts, MD Sent: 12/28/2017   3:24 PM EST To: Belva Crome, MD, Herminio Commons, MD Subject: RE: Pericardial effusion                       I agree with Hank's plan  Although the effusion is not large and there are no signs RV collapse etc, but the effusion is certainly big enough to cause symptoms.  I am not impressed that there is significant loculation and it probably could be drained easily if it gets larger.    ----- Message ----- From: Belva Crome, MD Sent: 12/28/2017   1:34 PM EST To: Herminio Commons, MD, Rexene Alberts, MD Subject: RE: Pericardial effusion                       Jamesetta So, the pericardial space seems to have a loculated/non-free-flowing effusion and ? adherent thrombus.  I would vote for discontinuation of anticoagulation, anti-inflammatory therapy, and repeat echo in a few weeks.  I have attached Dr. [name] to get his opinion.  Hank ----- Message ----- From: Herminio Commons, MD Sent: 12/28/2017  11:18 AM EST To: Belva Crome, MD Subject: Pericardial effusion                           Hi Hank, This patient has been having shortness of breath and her echocardiogram shows a moderate (in some areas large) pericardial effusion and I am wondering if her symptoms are due to this.  Seen by me recently and by Ermalinda Barrios today.  Can you please take a look at her echo and see if she is a candidate for pericardiocentesis?  Thanks.  Jamesetta So

## 2017-12-28 NOTE — Telephone Encounter (Signed)
I spoke with patient and told her to stop her Coumadin per Dr. Court Joy recommendations and will have a limited echo scheduled for 3 weeks to f/u effusion.

## 2017-12-29 ENCOUNTER — Other Ambulatory Visit: Payer: Self-pay | Admitting: *Deleted

## 2017-12-29 ENCOUNTER — Telehealth: Payer: Self-pay | Admitting: *Deleted

## 2017-12-29 DIAGNOSIS — I313 Pericardial effusion (noninflammatory): Secondary | ICD-10-CM

## 2017-12-29 DIAGNOSIS — I10 Essential (primary) hypertension: Secondary | ICD-10-CM

## 2017-12-29 DIAGNOSIS — I3139 Other pericardial effusion (noninflammatory): Secondary | ICD-10-CM

## 2017-12-29 MED ORDER — COLCHICINE 0.6 MG PO TABS: 0.6000 mg | ORAL_TABLET | Freq: Two times a day (BID) | ORAL | 0 refills | Status: DC

## 2017-12-29 NOTE — Telephone Encounter (Signed)
-----   Message from Herminio Commons, MD sent at 12/28/2017  4:52 PM EST ----- Regarding: colchicine Please start colchicine 0.5 mg bid and check BMET in one week. Have her take it for a month and then call us back to inform us of her symptoms. Tell patient it is to reduce inflammation around heart to hopefully reduce size of fluid collection.

## 2017-12-29 NOTE — Telephone Encounter (Signed)
Patient informed and verbalized understanding of plan. Colchicine 0.6 mg sent to pharmacy since 0.5 mg dose not available per Dr. Bronson Ing. Lab order faxed to [facility] lab.

## 2017-12-31 ENCOUNTER — Telehealth: Payer: Self-pay | Admitting: *Deleted

## 2017-12-31 MED ORDER — COLCHICINE 0.6 MG PO CAPS: 0.6000 mg | ORAL_CAPSULE | Freq: Two times a day (BID) | ORAL | 0 refills | Status: DC

## 2017-12-31 NOTE — Telephone Encounter (Signed)
Received denial for colchicine 0.6 mg tablets because patient has not used the brand name colchicine (mitigare). Brand name colchicine 0.6 mg #60 one by mouth twice daily called to [facility].

## 2018-01-01 ENCOUNTER — Other Ambulatory Visit (HOSPITAL_COMMUNITY)
Admission: RE | Admit: 2018-01-01 | Discharge: 2018-01-01 | Disposition: A | Discharge status: Home / Self Care | Payer: BLUE CROSS/BLUE SHIELD | Source: Ambulatory Visit | Attending: Cardiovascular Disease | Admitting: Cardiovascular Disease

## 2018-01-01 ENCOUNTER — Encounter (HOSPITAL_COMMUNITY): Payer: BLUE CROSS/BLUE SHIELD

## 2018-01-01 DIAGNOSIS — I313 Pericardial effusion (noninflammatory): Secondary | ICD-10-CM | POA: Diagnosis present

## 2018-01-01 DIAGNOSIS — I10 Essential (primary) hypertension: Secondary | ICD-10-CM | POA: Diagnosis present

## 2018-01-01 LAB — BASIC METABOLIC PANEL
Anion gap: 7 (ref 5–15)
BUN: 14 mg/dL (ref 6–20)
CHLORIDE: 105 mmol/L (ref 98–111)
CO2: 25 mmol/L (ref 22–32)
CREATININE: 0.71 mg/dL (ref 0.44–1.00)
Calcium: 9.1 mg/dL (ref 8.9–10.3)
GFR calc Af Amer: >60 mL/min (ref >60)
GFR calc non Af Amer: >60 mL/min (ref >60)
Glucose, Bld: 97 mg/dL (ref 70–99)
Potassium: 4.1 mmol/L (ref 3.5–5.1)
Sodium: 137 mmol/L (ref 135–145)

## 2018-01-05 ENCOUNTER — Encounter: Payer: Self-pay | Admitting: *Deleted

## 2018-01-07 ENCOUNTER — Telehealth: Payer: Self-pay | Admitting: Cardiovascular Disease

## 2018-01-07 NOTE — Telephone Encounter (Signed)
I would like to see what the follow-up echocardiogram shows tomorrow to make certain the fluid accumulation around her heart has not gotten worse.

## 2018-01-07 NOTE — Telephone Encounter (Signed)
Patient calling with c/o chest feeling heavy & uncomfortable.  Very hard to get good deep breath in.  Stated that she is on the Colchicine (Mitigare) 0.6mg  twice a day and her pmd has her on Augmentin now for URI.  Stated that she is just not feeling good at all and has had dull headache x last 2 days.  She also c/o heartburn & thinks the Colchicine may be causing this.  Stated that she did take baking soda and water for this which did help.  Her limited echo was originally scheduled for 01/19/18.  I did move this up to tomorrow at 1:00 at [facility].  Please advise on further suggestions and if okay to move that echo up.

## 2018-01-07 NOTE — Telephone Encounter (Signed)
Pre-cert Verification for the following procedure   Limited echo scheduled for 01-08-2018 at [facility]

## 2018-01-07 NOTE — Telephone Encounter (Signed)
Patient called stating that for a week now she is having pains in her chest. States I am not sure if it is indigestion or chest pains. States that she is having shortness of breath. She has a question about a new medication that she was started on.

## 2018-01-07 NOTE — Telephone Encounter (Signed)
Patient notified and verbalized understanding. 

## 2018-01-08 ENCOUNTER — Ambulatory Visit (HOSPITAL_COMMUNITY)
Admission: RE | Admit: 2018-01-08 | Discharge: 2018-01-08 | Disposition: A | Discharge status: Home / Self Care | Payer: BLUE CROSS/BLUE SHIELD | Source: Ambulatory Visit | Attending: Physician Assistant | Admitting: Physician Assistant

## 2018-01-08 DIAGNOSIS — I313 Pericardial effusion (noninflammatory): Secondary | ICD-10-CM | POA: Diagnosis not present

## 2018-01-08 DIAGNOSIS — I3139 Other pericardial effusion (noninflammatory): Secondary | ICD-10-CM

## 2018-01-08 MED ORDER — COLCHICINE 0.6 MG PO CAPS: 0.6000 mg | ORAL_CAPSULE | Freq: Every day | ORAL | 0 refills | Status: DC

## 2018-01-08 MED ORDER — IBUPROFEN 200 MG PO TABS: ORAL_TABLET | ORAL | 0 refills | Status: AC

## 2018-01-08 NOTE — Addendum Note (Signed)
Addended by: Laurine Blazer on: 01/08/2018 05:25 PM   Modules accepted: Orders

## 2018-01-08 NOTE — Progress Notes (Signed)
*  PRELIMINARY RESULTS* Echocardiogram 2D Echocardiogram LIMITED has been performed.  Leavy Cella 01/08/2018, 2:06 PM

## 2018-01-08 NOTE — Telephone Encounter (Signed)
Echo looks better to me. Maybe decrease colchicine to once daily and add Ibuprofen?

## 2018-01-08 NOTE — Telephone Encounter (Signed)
Patient notified and verbalized understanding. 

## 2018-01-08 NOTE — Telephone Encounter (Signed)
I agree with Hank. Let's decrease colchicine to once daily dosing and have her take ibuprofen 200 mg bid x one week followed by 200 mg daily.

## 2018-01-18 ENCOUNTER — Encounter: Payer: Self-pay | Admitting: Thoracic Surgery (Cardiothoracic Vascular Surgery)

## 2018-01-18 ENCOUNTER — Ambulatory Visit (INDEPENDENT_AMBULATORY_CARE_PROVIDER_SITE_OTHER): Payer: Self-pay | Admitting: Thoracic Surgery (Cardiothoracic Vascular Surgery)

## 2018-01-18 ENCOUNTER — Other Ambulatory Visit: Payer: Self-pay

## 2018-01-18 VITALS — BP 118/81 | HR 83 | Resp 16 | Ht 66.0 in | Wt 172.8 lb

## 2018-01-18 DIAGNOSIS — Q2112 Patent foramen ovale: Secondary | ICD-10-CM

## 2018-01-18 DIAGNOSIS — Q211 Atrial septal defect: Secondary | ICD-10-CM

## 2018-01-18 DIAGNOSIS — Z8774 Personal history of (corrected) congenital malformations of heart and circulatory system: Secondary | ICD-10-CM

## 2018-01-18 NOTE — Patient Instructions (Addendum)
Continue all previous medications without any changes at this time  You may resume unrestricted physical activity without any particular limitations at this time.   

## 2018-01-18 NOTE — Progress Notes (Addendum)
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  Her early postoperative recovery was uneventful and she was discharged home on the fifth postoperative day.  She was discharged on warfarin anticoagulation with plans for a total of 3 months postoperatively to decrease risk of patch thrombosis.  I last saw her in the office on November 09, 2017 at which time she was doing well.  Approximately 1 month later she developed relatively acute onset of atypical chest pain and shortness of breath.  She was evaluated in the emergency department where she underwent a CT angiogram to rule out pulmonary embolus.  There was no pulmonary embolus but she was noted to have a moderate sized pericardial effusion.  There were no signs of pulmonary opacity.  She was diagnosed with pneumonia.  She was prescribed doxycycline for 7 days and advised to follow-up in our office.  She was seen in our office December 14, 2017 and scheduled for follow-up echocardiogram.  She was seen by Estella Husk and underwent echocardiogram at [facility] on December 24, 2017.  There was normal left ventricular size and systolic function with a moderate sized circumferential pericardial effusion without any signs of right ventricular compression.  The intra-atrial septum also appeared intact with no sign of any residual shunt.  The patient was started on oral colchicine and ibuprofen for pericarditis.  Coumadin was stopped.  The patient symptoms improved and she will underwent follow-up echocardiogram January 08, 2018 which revealed considerable decrease size  in the patient's pericardial effusion.  She returns to our office today for further follow-up.  She reports that she is doing much better.  She no longer has any significant chest pain or shortness of breath.  She states that she still gets tired with activity better exercise tolerance and stamina are starting to slowly improve.  She has minimal soreness from her surgical incision.  Appetite is good.  The patient does report some symptoms of indigestion which she thinks are related to the ibuprofen.   Current Outpatient Medications  Medication Sig Dispense Refill  . acetaminophen (TYLENOL) 500 MG tablet Take 2 tablets (1,000 mg total) by mouth every 6 (six) hours as needed. 30 tablet 0  . bisacodyl (DULCOLAX) 5 MG EC tablet Take 5 mg by mouth daily as needed for moderate constipation.    . Colchicine 0.6 MG/5ML SOLN Take 1 tablet by mouth daily.    . diazepam (VALIUM) 2 MG tablet Take 2 mg by mouth 2 (two) times daily as needed for anxiety.     Marland Kitchen FLUoxetine (PROZAC) 40 MG capsule Take 40 mg by mouth daily.     Marland Kitchen ibuprofen (ADVIL) 200 MG tablet Take 200mg  by mouth twice a day x 7 days, then followed by 200mg  daily thereafter.  0  . Multiple Vitamin (MULTIVITAMIN WITH MINERALS) TABS tablet Take 1 tablet by mouth daily.    . nitroGLYCERIN (NITROSTAT) 0.4 MG SL tablet Place 1 tablet (0.4 mg total) under the tongue every 5 (five) minutes as needed for chest pain.  30 tablet 0   No current facility-administered medications for this visit.       Physical Exam:   BP 118/81 (BP Location: Right Arm, Patient Position: Sitting, Cuff Size: Large)   Pulse 83   Resp 16   Ht 5\' 6"  (1.676 m)   Wt 172 lb 12.8 oz (78.4 kg)   SpO2 98% Comment: ON RA  BMI 27.89 kg/m   General:  Well-appearing  Chest:   Clear to auscultation  CV:   Regular rate and rhythm without murmur  Incisions:  Well-healed  Abdomen:  Soft nontender  Extremities:  Warm and well-perfused  Diagnostic Tests:  Transthoracic  Echocardiography  Patient:    Kristy Lewis, Kristy Lewis MR #:       [id number] Study Date: 12/24/2017 Gender:     F Age:        75 Height:     167.6 cm Weight:     78.1 kg BSA:        1.93 m^2 Pt. Status: Room:   ATTENDING    Jenkins Rouge, M.D.  ORDERING     Jenkins Rouge, M.D.  REFERRING    Jenkins Rouge, M.D.  PERFORMING   Chmg, Forestine Na  SONOGRAPHER  Alvino Chapel, RCS  cc:  ------------------------------------------------------------------- LV EF: 55% -   60%  ------------------------------------------------------------------- Indications:      Atrial Septal Defect 745.5.  ------------------------------------------------------------------- History:   PMH:  Acquired from the patient and from the patient&'s chart.  PMH:  Right ventricular enlargement. Pericardial effusion. ASD (atrial septal defect), sinus venosus defect- S/P minimally invasive atrial septal defect closure + repair partial anomalous pulmonary venous return.  ------------------------------------------------------------------- Study Conclusions  - Left ventricle: The cavity size was normal. Wall thickness was   increased in a pattern of mild LVH. Systolic function was normal.   The estimated ejection fraction was in the range of 55% to 60%.   Wall motion was normal; there were no regional wall motion   abnormalities. Indeterminate diastolic function. - Right ventricle: The cavity size was mildly dilated. Systolic   function was mildly reduced. - Atrial septum: Patient status post minimally invasive repair of   sinus venosus ASD, correction of partial anomalous pulmonary   venous return, and closure of a PFO. No significant residual   shunt noted. - Tricuspid valve: There was physiologic regurgitation. - Pulmonary arteries: Systolic pressure could not be accurately   estimated. - Pericardium, extracardiac: A moderate pericardial effusion was   identified circumferential to the heart with evidence  of   organization apically and a moderate to large collection   anteriorly. No obvious RV compromise and the respiratory   variation in mitral outflow does not suggest tamponade   physiology.  ------------------------------------------------------------------- Study data:  Comparison was made to the study of 10/21/2017.  Study status:  Routine.  Procedure:  The patient reported no pain pre or post test. Transthoracic echocardiography. Image quality was adequate.  Study completion:  There were no complications. Transthoracic echocardiography.  M-mode, complete 2D, spectral Doppler, and color Doppler.  Birthdate:  Patient birthdate: May 12, 1960.  Age:  Patient is 57 yr old.  Sex:  Gender: female. BMI: 27.8 kg/m^2.  Blood pressure:     120/76  Patient status: Inpatient.  Study date:  Study date: 12/24/2017. Study time: 11:35 AM.  Location:  Echo laboratory.  -------------------------------------------------------------------  ------------------------------------------------------------------- Left ventricle:  The cavity size was normal. Wall thickness was increased in a pattern of mild LVH. Systolic function was normal. The estimated ejection  fraction was in the range of 55% to 60%. Wall motion was normal; there were no regional wall motion abnormalities. Indeterminate diastolic function.  ------------------------------------------------------------------- Aortic valve:   Trileaflet.  Doppler:  There was no significant regurgitation.  ------------------------------------------------------------------- Aorta:  Aortic root: The aortic root was normal in size.  ------------------------------------------------------------------- Mitral valve:   The valve appears to be grossly normal.    Doppler:  There was no significant regurgitation.    Peak gradient (D): 3 mm Hg.  ------------------------------------------------------------------- Left atrium:  The atrium was normal in  size.  ------------------------------------------------------------------- Atrial septum:  Patient status post minimally invasive repair of sinus venosus ASD, correction of partial anomalous pulmonary venous return, and closure of a PFO. No significant residual shunt noted.   ------------------------------------------------------------------- Right ventricle:  The cavity size was mildly dilated. Systolic function was mildly reduced.  ------------------------------------------------------------------- Pulmonic valve:    The valve appears to be grossly normal. Doppler:  There was no significant regurgitation.  ------------------------------------------------------------------- Tricuspid valve:   The valve appears to be grossly normal. Doppler:  There was physiologic regurgitation.  ------------------------------------------------------------------- Pulmonary artery:    Systolic pressure could not be accurately estimated.  ------------------------------------------------------------------- Right atrium:  The atrium was normal in size.  ------------------------------------------------------------------- Pericardium:  A moderate pericardial effusion was identified circumferential to the heart with evidence of organization apically and a moderate to large collection anteriorly. No obvious RV compromise and the respiratory variation in mitral outflow does not suggest tamponade physiology.  ------------------------------------------------------------------- Systemic veins: Inferior vena cava: The vessel was normal in size. The respirophasic diameter changes were in the normal range (>= 50%), consistent with normal central venous pressure.  ------------------------------------------------------------------- Measurements   Left ventricle                           Value        Reference  LV ID, ED, PLAX chordal          (L)     40.2  mm     43 - 52  LV ID, ES, PLAX chordal                   26.3  mm     23 - 38  LV fx shortening, PLAX chordal           35    %      >=29  LV PW thickness, ED                      10.2  mm     ----------  IVS/LV PW ratio, ED                      1.13         <=1.3  Stroke volume, 2D                        68    ml     ----------  Stroke volume/bsa, 2D                    35    ml/m^2 ----------  LV ejection fraction, 1-p A4C            67    %      ----------  LV end-diastolic volume, 2-p             53    ml     ----------  LV end-systolic volume, 2-p              20    ml     ----------  LV ejection fraction, 2-p                63    %      ----------  Stroke volume, 2-p                       34    ml     ----------  LV end-diastolic volume/bsa, 2-p         28    ml/m^2 ----------  LV end-systolic volume/bsa, 2-p          10    ml/m^2 ----------  Stroke volume/bsa, 2-p                   17.4  ml/m^2 ----------  LV e&', lateral                           7.83  cm/s   ----------  LV E/e&', lateral                         11.12        ----------  LV e&', medial                            7.07  cm/s   ----------  LV E/e&', medial                          12.32        ----------  LV e&', average                           7.45  cm/s   ----------  LV E/e&', average                         11.69        ----------    Ventricular septum                       Value        Reference  IVS thickness, ED                        11.5  mm     ----------    LVOT                                     Value        Reference  LVOT ID, S                               19    mm     ----------  LVOT area                                2.84  cm^2   ----------  LVOT peak velocity, S  117   cm/s   ----------  LVOT mean velocity, S                    72    cm/s   ----------  LVOT VTI, S                              24    cm     ----------  LVOT peak gradient, S                    5     mm Hg  ----------    Aorta                                     Value        Reference  Aortic root ID, ED                       30    mm     ----------    Left atrium                              Value        Reference  LA ID, A-P, ES                           34    mm     ----------  LA ID/bsa, A-P                           1.77  cm/m^2 <=2.2  LA volume, S                             40.6  ml     ----------  LA volume/bsa, S                         21.1  ml/m^2 ----------  LA volume, ES, 1-p A4C                   36.5  ml     ----------  LA volume/bsa, ES, 1-p A4C               19    ml/m^2 ----------  LA volume, ES, 1-p A2C                   45    ml     ----------  LA volume/bsa, ES, 1-p A2C               23.4  ml/m^2 ----------    Mitral valve                             Value        Reference  Mitral E-wave peak velocity              87.1  cm/s   ----------  Mitral A-wave peak velocity              72.1  cm/s   ----------  Mitral deceleration  time                 190   ms     150 - 230  Mitral peak gradient, D                  3     mm Hg  ----------  Mitral E/A ratio, peak                   1.2          ----------    Right atrium                             Value        Reference  RA ID, S-I, ES, A4C                      44.6  mm     34 - 49  RA area, ES, A4C                         11.3  cm^2   8.3 - 19.5  RA volume, ES, A/L                       24    ml     ----------  RA volume/bsa, ES, A/L                   12.5  ml/m^2 ----------    Systemic veins                           Value        Reference  Estimated CVP                            3     mm Hg  ----------    Right ventricle                          Value        Reference  RV ID, ED, PLAX                          26.2  mm     19 - 38  TAPSE                                    13    mm     ----------  RV s&', lateral, S                        8.7   cm/s   ----------  Legend: (L)  and  (H)  mark values outside specified reference  range.  ------------------------------------------------------------------- Prepared and Electronically Authenticated by  Rozann Lesches, M.D. 2019-12-05T13:59:50    Transthoracic Echocardiography  Patient:    Ozie, Dimaria MR #:       [id number] Study Date: 01/08/2018 Gender:     F Age:        25 Height:     167.6 cm Weight:     79.1 kg BSA:  1.94 m^2 Pt. Status: Room:   ATTENDING    Virgel Paling     Ermalinda Barrios M  REFERRING    Imogene Burn  SONOGRAPHER  Leavy Cella  PERFORMING   Chmg, Forestine Na  cc:  ------------------------------------------------------------------- LV EF: 55% -   60%  ------------------------------------------------------------------- Indications:      Pericardial effusion 423.9.  ------------------------------------------------------------------- History:   PMH:  Former Smoker, Right Ventricular Defect,ASD (atrial septal defect), sinus venosus defect, S/P minimally invasive atrial septal defect closure + repair partial anomalous pulmonary venous return  Murmur.  ------------------------------------------------------------------- Study Conclusions  - Limited study to evaluate pericardial effusion. - Left ventricle: Systolic function was normal. The estimated   ejection fraction was in the range of 55% to 60%. Wall motion was   normal; there were no regional wall motion abnormalities. Left   ventricular diastolic function parameters were normal. - Systemic veins: IVC is small, suggesting low RA pressure and   hypovolemia. - Pericardium, extracardiac: There is a small circumferential   pericardial effusion, measures 0.9 cm in diastole adjacent to the   LV. Small area near RV apex either prominent dense fat pad or   small area of loculated effusion. The effusion is significantly   smaller compared to the 12/24/17  study.  ------------------------------------------------------------------- Study data:  Comparison was made to the study of 12/24/2017.  Study status:  Routine.  Procedure:  Transthoracic echocardiography. Image quality was adequate.          Transthoracic echocardiography.  M-mode, limited 2D, limited spectral Doppler, and color Doppler.  Birthdate:  Patient birthdate: 05/18/1960. Age:  Patient is 57 yr old.  Sex:  Gender: female.    BMI: 28.2 kg/m^2.  Blood pressure:     118/81  Patient status:  Outpatient. Study date:  Study date: 01/08/2018. Study time: 01:28 PM. Location:  Echo laboratory.  -------------------------------------------------------------------  ------------------------------------------------------------------- Left ventricle:  Systolic function was normal. The estimated ejection fraction was in the range of 55% to 60%. Wall motion was normal; there were no regional wall motion abnormalities. Left ventricular diastolic function parameters were normal.  ------------------------------------------------------------------- Aortic valve:   Trileaflet; normal thickness leaflets.  ------------------------------------------------------------------- Aorta:  Aortic root: The aortic root was normal in size.  ------------------------------------------------------------------- Mitral valve:   Doppler:     Peak gradient (D): 2 mm Hg.  ------------------------------------------------------------------- Left atrium:  The atrium was normal in size.   ------------------------------------------------------------------- Pericardium:  There is a small circumferential pericardial effusion, measures 0.9 cm in diastole adjacent to the LV. Small area near RV apex either prominent dense fat pad or small area of loculated effusion. The effusion is significantly smaller compared to the 12/24/17  study.  ------------------------------------------------------------------- Systemic veins:  IVC is small, suggesting low RA pressure and hypovolemia.  ------------------------------------------------------------------- Measurements   Left ventricle                           Value        Reference  LV ID, ED, PLAX chordal        (L)       31.6  mm     43 - 52  LV ID, ES, PLAX chordal        (L)       20.5  mm     23 - 38  LV fx shortening, PLAX chordal           35    %      >=  29  LV PW thickness, ED                      11.9  mm     ---------  IVS/LV PW ratio, ED                      1.1          <=1.3  LV e&', lateral                           10.7  cm/s   ---------  LV E/e&', lateral                         6.84         ---------  LV e&', medial                            6.42  cm/s   ---------  LV E/e&', medial                          11.4         ---------  LV e&', average                           8.56  cm/s   ---------  LV E/e&', average                         8.55         ---------    Ventricular septum                       Value        Reference  IVS thickness, ED                        13.1  mm     ---------    Aorta                                    Value        Reference  Aortic root ID, ED                       28    mm     ---------    Left atrium                              Value        Reference  LA ID, A-P, ES                           37    mm     ---------  LA ID/bsa, A-P                           1.91  cm/m^2 <=2.2  LA volume, S  38.9  ml     ---------  LA volume/bsa, S                         20.1  ml/m^2 ---------  LA volume, ES, 1-p A4C                   32.7  ml     ---------  LA volume/bsa, ES, 1-p A4C               16.9  ml/m^2 ---------  LA volume, ES, 1-p A2C                   46    ml     ---------  LA volume/bsa, ES, 1-p A2C               23.7  ml/m^2 ---------    Mitral valve                             Value         Reference  Mitral E-wave peak velocity              73.2  cm/s   ---------  Mitral A-wave peak velocity              55.3  cm/s   ---------  Mitral deceleration time       (L)       144   ms     150 - 230  Mitral peak gradient, D                  2     mm Hg  ---------  Mitral E/A ratio, peak                   1.3          ---------  Legend: (L)  and  (H)  mark values outside specified reference range.  ------------------------------------------------------------------- Prepared and Electronically Authenticated by  Kerry Hough, M.D. 2019-12-20T15:14:37    Impression:  Patient is doing well approximately 53-month status post minimally invasive closure of sinus venosus type atrial septal defect with repair of partial anomalous pulmonary venous return.  Approximately 1 month ago the patient developed likely pericarditis with a moderate sized pericardial effusion manifest as symptoms of atypical chest pain and shortness of breath.  Symptoms have improved considerably and follow-up echocardiogram performed 10 days ago revealed significant decrease in size of the patient's pericardial effusion.    Plan:  I have suggested that the patient may want to stop taking ibuprofen since she is having some symptoms of reflux.  It might be wise to continue colchicine for a few more weeks, but ultimately this can be stopped as well.  I have encouraged the patient to continue to gradually increase her physical activity without any particular limitations.  I think she might benefit from participation in the outpatient cardiac rehab program.  All of her questions have been addressed.  At some point it might be wise to check 1 final echocardiogram to make sure that her pericardial effusion has completely resolved, but she seems to be doing quite well at this point in time.  She will continue to follow-up with Dr. Bronson Ing and return to our office for routine follow-up next fall, approximately 1 year  following her surgery.  She will call and return sooner should  specific problems or questions arise.    Valentina Gu. [name], MD 01/18/2018 4:00 PM

## 2018-01-19 ENCOUNTER — Other Ambulatory Visit: Payer: BLUE CROSS/BLUE SHIELD

## 2018-02-10 ENCOUNTER — Encounter (HOSPITAL_COMMUNITY)
Admission: RE | Admit: 2018-02-10 | Discharge: 2018-02-10 | Disposition: A | Discharge status: Home / Self Care | Payer: BLUE CROSS/BLUE SHIELD | Source: Ambulatory Visit | Attending: Thoracic Surgery (Cardiothoracic Vascular Surgery) | Admitting: Thoracic Surgery (Cardiothoracic Vascular Surgery)

## 2018-02-10 ENCOUNTER — Encounter (HOSPITAL_COMMUNITY): Payer: BLUE CROSS/BLUE SHIELD

## 2018-02-10 ENCOUNTER — Encounter (HOSPITAL_COMMUNITY): Payer: Self-pay

## 2018-02-10 VITALS — BP 108/70 | Ht 66.0 in | Wt 175.8 lb

## 2018-02-10 DIAGNOSIS — Z8774 Personal history of (corrected) congenital malformations of heart and circulatory system: Secondary | ICD-10-CM | POA: Diagnosis present

## 2018-02-10 NOTE — Progress Notes (Signed)
Cardiac/Pulmonary Rehab Medication Review by a Pharmacist  Does the patient  feel that his/her medications are working for him/her?  yes  Has the patient been experiencing any side effects to the medications prescribed?  no  Does the patient measure his/her own blood pressure or blood glucose at home?  no   Does the patient have any problems obtaining medications due to transportation or finances?   no  Understanding of regimen: excellent Understanding of indications: excellent Potential of compliance: excellent  Questions asked to Determine Patient Understanding of Medication Regimen:  1. What is the name of the medication?  2. What is the medication used for?  3. When should it be taken?  4. How much should be taken?  5. How will you take it?  6. What side effects should you report?  Understanding Defined as: Excellent: All questions above are correct Good: Questions 1-4 are correct Fair: Questions 1-2 are correct  Poor: 1 or none of the above questions are correct   Pharmacist comments: Patient presents today for cardiac rehab. Reviewed current medications which have been deescalated after atrial septal defect repair in October 2019. She is tolerating her medications and happy that she has completed the colchicine and warfarin. Continue current regimen.  Thanks for opportunity to participate in the care of this patient,  Isac Sarna, BS [place], [place] Pharmacist Pager [phone] 02/10/2018 9:13 AM

## 2018-02-10 NOTE — Progress Notes (Signed)
Daily Session Note  Patient Details  Name: ANYRA KAUFMAN MRN: [id number] Date of Birth: 1960-02-13 Referring Provider:     CARDIAC REHAB PHASE II ORIENTATION from 02/10/2018 in [place]  Referring Provider  [name]      Encounter Date: 02/10/2018  Check In: Session Check In - 02/10/18 1113      Check-In   Supervising physician immediately available to respond to emergencies  See telemetry face sheet for immediately available MD    Location  [facility]    Staff Present  Russella Dar, MS, EP, [facility], Exercise Physiologist;Amanda Ballard, Exercise Physiologist;Debra Wynetta Emery, RN, BSN    Medication changes reported      No    Fall or balance concerns reported     No    Tobacco Cessation  --   Quit 08/05/2017   Warm-up and Cool-down  Performed as group-led instruction    Resistance Training Performed  Yes    VAD Patient?  No    PAD/SET Patient?  No      Pain Assessment   Currently in Pain?  Yes    Pain Score  4     Pain Location  Breast    Pain Orientation  Right    Pain Descriptors / Indicators  Sore    Pain Type  Other (Comment)   Post heart surgery   Pain Onset  More than a month ago    Pain Frequency  Constant    Aggravating Factors   Movement with arms    Pain Relieving Factors  Rest    Effect of Pain on Daily Activities  None    Multiple Pain Sites  No       Capillary Blood Glucose: No results found for this or any previous visit (from the past 24 hour(s)).    Social History   Tobacco Use  Smoking Status Former Smoker  . Packs/day: 0.50  . Last attempt to quit: 08/05/2017  . Years since quitting: 0.5  Smokeless Tobacco Never Used  Tobacco Comment   quit 2 yrs ago after smoking for over 20 yrs.    Goals Met:  Independence with exercise equipment Exercise tolerated well Personal goals reviewed No report of cardiac concerns or symptoms Strength training completed today  Goals Unmet:  Not Applicable  Comments:  Check out: 1030   Dr. Kate Sable is Medical Director for [place] and Pulmonary Rehab.

## 2018-02-10 NOTE — Progress Notes (Signed)
Cardiac Individual Treatment Plan  Patient Details  Name: Kristy Lewis MRN: [id number] Date of Birth: 1960-07-19 Referring Provider:     CARDIAC REHAB PHASE II ORIENTATION from 02/10/2018 in [place]  Referring Provider  [name]      Initial Encounter Date:    CARDIAC REHAB PHASE II ORIENTATION from 02/10/2018 in [place]  Date  02/10/18      Visit Diagnosis: ASD, spontaneous closure  Patient's Home Medications on Admission:  Current Outpatient Medications:  .  SUMAtriptan (IMITREX) 50 MG tablet, Take 50 mg by mouth every 2 (two) hours as needed for migraine. May repeat in 2 hours if headache persists or recurs., Disp: , Rfl:  .  acetaminophen (TYLENOL) 500 MG tablet, Take 2 tablets (1,000 mg total) by mouth every 6 (six) hours as needed., Disp: 30 tablet, Rfl: 0 .  bisacodyl (DULCOLAX) 5 MG EC tablet, Take 5 mg by mouth daily as needed for moderate constipation., Disp: , Rfl:  .  Colchicine 0.6 MG/5ML SOLN, Take 1 tablet by mouth daily., Disp: , Rfl:  .  diazepam (VALIUM) 2 MG tablet, Take 2 mg by mouth 2 (two) times daily as needed for anxiety. , Disp: , Rfl:  .  FLUoxetine (PROZAC) 40 MG capsule, Take 40 mg by mouth daily. , Disp: , Rfl:  .  ibuprofen (ADVIL) 200 MG tablet, Take 200mg  by mouth twice a day x 7 days, then followed by 200mg  daily thereafter. (Patient taking differently: daily as needed for headache or mild pain. ), Disp: , Rfl: 0 .  Multiple Vitamin (MULTIVITAMIN WITH MINERALS) TABS tablet, Take 1 tablet by mouth daily., Disp: , Rfl:  .  nitroGLYCERIN (NITROSTAT) 0.4 MG SL tablet, Place 1 tablet (0.4 mg total) under the tongue every 5 (five) minutes as needed for chest pain., Disp: 30 tablet, Rfl: 0  Past Medical History: Past Medical History:  Diagnosis Date  . Anginal pain ([place])    Comes and goes, pt states its normal to have every day   . ASD (atrial septal defect), sinus venosus defect   . Bipolar disorder  ([place])    pt denies  . Cardiac murmur   . Carpal tunnel syndrome   . Depressive disorder   . Dysphagia   . Dyspnea    Upon exertion  . Esophageal reflux   . Genital herpes    at age 58  . Hypercholesteremia   . Migraines   . 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 Urinary incontinence     Tobacco Use: Social History   Tobacco Use  Smoking Status Former Smoker  . Packs/day: 0.50  . Last attempt to quit: 08/05/2017  . Years since quitting: 0.5  Smokeless Tobacco Never Used  Tobacco Comment   quit 2 yrs ago after smoking for over 20 yrs.    Labs: Recent Review Flowsheet Data    Labs for ITP Cardiac and Pulmonary Rehab Latest Ref Rng & Units 10/21/2017 10/21/2017 10/21/2017 10/21/2017 10/22/2017   Hemoglobin A1c 4.8 - 5.6 % - - - - -   PHART 7.350 - 7.450 7.244(L) 7.350 7.339(L) - -   PCO2ART 32.0 - 48.0 mmHg 59.5(H) 49.5(H) 45.3 - -   HCO3 20.0 - 28.0 mmol/L 25.7 27.4 24.6 - -   TCO2 22 - 32 mmol/L 27 29 26 25 27    ACIDBASEDEF 0.0 - 2.0 mmol/L 3.0(H) - 2.0 - -   O2SAT %  96.0 98.0 98.0 - -      Capillary Blood Glucose: Lab Results  Component Value Date   GLUCAP 95 10/23/2017   GLUCAP 92 10/23/2017   GLUCAP 103 (H) 10/22/2017   GLUCAP 99 10/22/2017   GLUCAP 94 10/22/2017     Exercise Target Goals: Exercise Program Goal: Individual exercise prescription set using results from initial 6 min walk test and THRR while considering  patient's activity barriers and safety.   Exercise Prescription Goal: Starting with aerobic activity 30 plus minutes a day, 3 days per week for initial exercise prescription. Provide home exercise prescription and guidelines that participant acknowledges understanding prior to discharge.  Activity Barriers & Risk Stratification: Activity Barriers & Cardiac Risk Stratification - 02/10/18 0918      Activity Barriers & Cardiac Risk Stratification   Activity Barriers   None    Cardiac Risk Stratification  High       6 Minute Walk: 6 Minute Walk    Row Name 02/10/18 0917         6 Minute Walk   Phase  Initial     Distance  1400 feet     Walk Time  6 minutes     # of Rest Breaks  0     MPH  2.65     METS  3.03     RPE  10     Perceived Dyspnea   11     VO2 Peak  12.85     Symptoms  No     Resting HR  86 bpm     Resting BP  108/70     Resting Oxygen Saturation   97 %     Exercise Oxygen Saturation  during 6 min walk  97 %     Max Ex. HR  101 bpm     Max Ex. BP  124/72     2 Minute Post BP  110/68        Oxygen Initial Assessment:   Oxygen Re-Evaluation:   Oxygen Discharge (Final Oxygen Re-Evaluation):   Initial Exercise Prescription: Initial Exercise Prescription - 02/10/18 0900      Date of Initial Exercise RX and Referring Provider   Date  02/10/18    Referring Provider  [name]    Expected Discharge Date  05/12/18      Treadmill   MPH  1.8    Grade  0    Minutes  17    METs  2.37      Recumbant Elliptical   Level  1    RPM  42    Watts  57    Minutes  22    METs  3.1      Prescription Details   Frequency (times per week)  3    Duration  Progress to 30 minutes of continuous aerobic without signs/symptoms of physical distress      Intensity   THRR 40-80% of Max Heartrate  107-123-138    Ratings of Perceived Exertion  11-13    Perceived Dyspnea  0-4      Progression   Progression  Continue to progress workloads to maintain intensity without signs/symptoms of physical distress.      Resistance Training   Training Prescription  Yes    Weight  1    Reps  10-15       Perform Capillary Blood Glucose checks as needed.  Exercise Prescription Changes:   Exercise Comments:   Exercise Goals and Review:  Exercise Goals    Row Name 02/10/18 0919             Exercise Goals   Increase Physical Activity  Yes       Intervention  Provide advice, education, support and counseling about physical  activity/exercise needs.;Develop an individualized exercise prescription for aerobic and resistive training based on initial evaluation findings, risk stratification, comorbidities and participant's personal goals.       Expected Outcomes  Short Term: Attend rehab on a regular basis to increase amount of physical activity.       Increase Strength and Stamina  Yes       Intervention  Provide advice, education, support and counseling about physical activity/exercise needs.;Develop an individualized exercise prescription for aerobic and resistive training based on initial evaluation findings, risk stratification, comorbidities and participant's personal goals.       Expected Outcomes  Short Term: Increase workloads from initial exercise prescription for resistance, speed, and METs.       Able to understand and use rate of perceived exertion (RPE) scale  Yes       Intervention  Provide education and explanation on how to use RPE scale       Expected Outcomes  Short Term: Able to use RPE daily in rehab to express subjective intensity level;Long Term:  Able to use RPE to guide intensity level when exercising independently       Able to understand and use Dyspnea scale  Yes       Intervention  Provide education and explanation on how to use Dyspnea scale       Expected Outcomes  Short Term: Able to use Dyspnea scale daily in rehab to express subjective sense of shortness of breath during exertion;Long Term: Able to use Dyspnea scale to guide intensity level when exercising independently       Knowledge and understanding of Target Heart Rate Range (THRR)  Yes       Intervention  Provide education and explanation of THRR including how the numbers were predicted and where they are located for reference       Expected Outcomes  Short Term: Able to state/look up THRR       Able to check pulse independently  Yes       Intervention  Provide education and demonstration on how to check pulse in carotid and radial  arteries.;Review the importance of being able to check your own pulse for safety during independent exercise       Expected Outcomes  Short Term: Able to explain why pulse checking is important during independent exercise;Long Term: Able to check pulse independently and accurately       Understanding of Exercise Prescription  Yes       Intervention  Provide education, explanation, and written materials on patient's individual exercise prescription       Expected Outcomes  Short Term: Able to explain program exercise prescription;Long Term: Able to explain home exercise prescription to exercise independently          Exercise Goals Re-Evaluation :    Discharge Exercise Prescription (Final Exercise Prescription Changes):   Nutrition:  Target Goals: Understanding of nutrition guidelines, daily intake of sodium 1500mg , cholesterol 200mg , calories 30% from fat and 7% or less from saturated fats, daily to have 5 or more servings of fruits and vegetables.  Biometrics: Pre Biometrics - 02/10/18 0920      Pre Biometrics   Height  5\' 6"  (1.676 m)  Waist Circumference  37 inches    Hip Circumference  40 inches    Waist to Hip Ratio  0.92 %    Triceps Skinfold  23 mm    % Body Fat  38.1 %    Grip Strength  8.7 kg    Single Leg Stand  21 seconds        Nutrition Therapy Plan and Nutrition Goals: Nutrition Therapy & Goals - 02/10/18 1135      Personal Nutrition Goals   Personal Goal #2  Patient does not have CAD. She has congenital heart disease. She pretty much eats a heart healthy diet, low sodium diet.     Additional Goals?  No       Nutrition Assessments: Nutrition Assessments - 02/10/18 1137      MEDFICTS Scores   Pre Score  42       Nutrition Goals Re-Evaluation:   Nutrition Goals Discharge (Final Nutrition Goals Re-Evaluation):   Psychosocial: Target Goals: Acknowledge presence or absence of significant depression and/or stress, maximize coping skills, provide  positive support system. Participant is able to verbalize types and ability to use techniques and skills needed for reducing stress and depression.  Initial Review & Psychosocial Screening: Initial Psych Review & Screening - 02/10/18 1134      Initial Review   Current issues with  History of Depression      Family Dynamics   Good Support System?  Yes      Barriers   Psychosocial barriers to participate in program  There are no identifiable barriers or psychosocial needs.      Screening Interventions   Interventions  Encouraged to exercise    Expected Outcomes  Short Term goal: Identification and review with participant of any Quality of Life or Depression concerns found by scoring the questionnaire.;Long Term goal: The participant improves quality of Life and PHQ9 Scores as seen by post scores and/or verbalization of changes       Quality of Life Scores: Quality of Life - 02/10/18 0921      Quality of Life   Select  Quality of Life      Quality of Life Scores   Health/Function Pre  11.67 %    Socioeconomic Pre  16.31 %    Psych/Spiritual Pre  20.36 %    Family Pre  20.5 %    GLOBAL Pre  15.73 %      Scores of 19 and below usually indicate a poorer quality of life in these areas.  A difference of  2-3 points is a clinically meaningful difference.  A difference of 2-3 points in the total score of the Quality of Life Index has been associated with significant improvement in overall quality of life, self-image, physical symptoms, and general health in studies assessing change in quality of life.  PHQ-9: Recent Review Flowsheet Data    Depression screen [facility] 2/9 02/10/2018   Decreased Interest 0   Down, Depressed, Hopeless 1   PHQ - 2 Score 1   Altered sleeping 0   Tired, decreased energy 1   Change in appetite 1   Feeling bad or failure about yourself  0   Trouble concentrating 1   Moving slowly or fidgety/restless 0   Suicidal thoughts 0   PHQ-9 Score 4   Difficult doing  work/chores Not difficult at all     Interpretation of Total Score  Total Score Depression Severity:  1-4 = Minimal depression, 5-9 = Mild depression, 10-14 =  Moderate depression, 15-19 = Moderately severe depression, 20-27 = Severe depression   Psychosocial Evaluation and Intervention: Psychosocial Evaluation - 02/10/18 1135      Psychosocial Evaluation & Interventions   Interventions  Encouraged to exercise with the program and follow exercise prescription    Continue Psychosocial Services   No Follow up required       Psychosocial Re-Evaluation:   Psychosocial Discharge (Final Psychosocial Re-Evaluation):   Vocational Rehabilitation: Provide vocational rehab assistance to qualifying candidates.   Vocational Rehab Evaluation & Intervention: Vocational Rehab - 02/10/18 1140      Initial Vocational Rehab Evaluation & Intervention   Assessment shows need for Vocational Rehabilitation  No       Education: Education Goals: Education classes will be provided on a weekly basis, covering required topics. Participant will state understanding/return demonstration of topics presented.  Learning Barriers/Preferences: Learning Barriers/Preferences - 02/10/18 1139      Learning Barriers/Preferences   Learning Barriers  None    Learning Preferences  Individual Instruction;Group Instruction;Written Material;Pictoral;Skilled Demonstration       Education Topics: Hypertension, Hypertension Reduction -Define heart disease and high blood pressure. Discus how high blood pressure affects the body and ways to reduce high blood pressure.   Exercise and Your Heart -Discuss why it is important to exercise, the FITT principles of exercise, normal and abnormal responses to exercise, and how to exercise safely.   Angina -Discuss definition of angina, causes of angina, treatment of angina, and how to decrease risk of having angina.   Cardiac Medications -Review what the following  cardiac medications are used for, how they affect the body, and side effects that may occur when taking the medications.  Medications include Aspirin, Beta blockers, calcium channel blockers, ACE Inhibitors, angiotensin receptor blockers, diuretics, digoxin, and antihyperlipidemics.   Congestive Heart Failure -Discuss the definition of CHF, how to live with CHF, the signs and symptoms of CHF, and how keep track of weight and sodium intake.   Heart Disease and Intimacy -Discus the effect sexual activity has on the heart, how changes occur during intimacy as we age, and safety during sexual activity.   Smoking Cessation / COPD -Discuss different methods to quit smoking, the health benefits of quitting smoking, and the definition of COPD.   Nutrition I: Fats -Discuss the types of cholesterol, what cholesterol does to the heart, and how cholesterol levels can be controlled.   Nutrition II: Labels -Discuss the different components of food labels and how to read food label   Heart Parts/Heart Disease and PAD -Discuss the anatomy of the heart, the pathway of blood circulation through the heart, and these are affected by heart disease.   Stress I: Signs and Symptoms -Discuss the causes of stress, how stress may lead to anxiety and depression, and ways to limit stress.   Stress II: Relaxation -Discuss different types of relaxation techniques to limit stress.   Warning Signs of Stroke / TIA -Discuss definition of a stroke, what the signs and symptoms are of a stroke, and how to identify when someone is having stroke.   Knowledge Questionnaire Score: Knowledge Questionnaire Score - 02/10/18 1140      Knowledge Questionnaire Score   Pre Score  22/24       Core Components/Risk Factors/Patient Goals at Admission: Personal Goals and Risk Factors at Admission - 02/10/18 1141      Core Components/Risk Factors/Patient Goals on Admission    Weight Management  Yes    Intervention   Weight Management/Obesity:  Establish reasonable short term and long term weight goals.    Admit Weight  175 lb 12.8 oz (79.7 kg)    Goal Weight: Short Term  170 lb 12.8 oz (77.5 kg)    Goal Weight: Long Term  165 lb 12.8 oz (75.2 kg)    Expected Outcomes  Short Term: Continue to assess and modify interventions until short term weight is achieved;Long Term: Adherence to nutrition and physical activity/exercise program aimed toward attainment of established weight goal    Personal Goal Other  Yes    Personal Goal  Be stronger, Look and feel better    Intervention  Attend CR class 3x week and supplement with home exercise 2 x week    Expected Outcomes  Reach personal goals       Core Components/Risk Factors/Patient Goals Review:    Core Components/Risk Factors/Patient Goals at Discharge (Final Review):    ITP Comments:   Comments: Patient arrived for 1st visit/orientation/education at 0800. Patient was referred to CR by Dr. [name] due to ASD (Z87.74). During orientation advised patient on arrival and appointment times what to wear, what to do before, during and after exercise. Reviewed attendance and class policy. Talked about inclement weather and class consultation policy. Pt is scheduled to return Cardiac Rehab on 02/15/2018 at 0815. Pt was advised to come to class 15 minutes before class starts. Patient was also given instructions on meeting with the dietician and attending the Family Structure classes. Discussed RPE/Dpysnea scales. Discussed initial THR and how to find their radial and/or carotid pulse. Discussed the initial exercise prescription and how this effects their progress. Pt is eager to get started. Patient participated in warm up stretches followed by light weights and resistance bands. Patient was able to complete 6 minute walk test. Patient c/o (R) breast incisional pain 4/10. Pain did not go away because she states it is always there with or without activity. Patient was measured  for the equipment. Discussed equipment safety with patient. Took patient pre-anthropometric measurements. Patient finished visit at 1030.

## 2018-02-15 ENCOUNTER — Encounter (HOSPITAL_COMMUNITY): Payer: Self-pay

## 2018-02-15 ENCOUNTER — Encounter (HOSPITAL_COMMUNITY)
Admission: RE | Admit: 2018-02-15 | Discharge: 2018-02-15 | Disposition: A | Discharge status: Home / Self Care | Payer: BLUE CROSS/BLUE SHIELD | Source: Ambulatory Visit | Attending: Cardiovascular Disease | Admitting: Cardiovascular Disease

## 2018-02-15 ENCOUNTER — Encounter (HOSPITAL_COMMUNITY): Payer: BLUE CROSS/BLUE SHIELD

## 2018-02-15 DIAGNOSIS — Z8774 Personal history of (corrected) congenital malformations of heart and circulatory system: Secondary | ICD-10-CM | POA: Diagnosis not present

## 2018-02-15 NOTE — Progress Notes (Signed)
Daily Session Note  Patient Details  Name: REGENA DELUCCHI MRN: [id number] Date of Birth: 05/28/1960 Referring Provider:     CARDIAC REHAB PHASE II ORIENTATION from 02/10/2018 in [place]  Referring Provider  [name]      Encounter Date: 02/15/2018  Check In: Session Check In - 02/15/18 0930      Check-In   Supervising physician immediately available to respond to emergencies  See telemetry face sheet for immediately available MD    Location  [facility]    Staff Present  Benay Pike, Exercise Physiologist;Debra Wynetta Emery, RN, BSN    Medication changes reported      No    Fall or balance concerns reported     No    Warm-up and Cool-down  Performed as group-led instruction    Resistance Training Performed  Yes    VAD Patient?  No    PAD/SET Patient?  No      Pain Assessment   Currently in Pain?  Yes    Pain Score  3     Pain Location  Breast    Pain Orientation  Right    Pain Descriptors / Indicators  Sore    Pain Type  Other (Comment)    Pain Radiating Towards  Post surgery incision     Pain Onset  More than a month ago    Pain Frequency  Constant    Aggravating Factors   movement with arms     Pain Relieving Factors  rest    Effect of Pain on Daily Activities  none     Multiple Pain Sites  No       Capillary Blood Glucose: No results found for this or any previous visit (from the past 24 hour(s)).    Social History   Tobacco Use  Smoking Status Former Smoker  . Packs/day: 0.50  . Last attempt to quit: 08/05/2017  . Years since quitting: 0.5  Smokeless Tobacco Never Used  Tobacco Comment   quit 2 yrs ago after smoking for over 20 yrs.    Goals Met:  Independence with exercise equipment Exercise tolerated well No report of cardiac concerns or symptoms Strength training completed today  Goals Unmet:  Not Applicable  Comments: Pt able to follow exercise prescription today without complaint.  Will continue to  monitor for progression. Check out 1030.   Dr. Kate Sable is Medical Director for [facility] Cardiac and Pulmonary Rehab.

## 2018-02-16 ENCOUNTER — Telehealth: Payer: Self-pay | Admitting: Cardiovascular Disease

## 2018-02-16 NOTE — Telephone Encounter (Signed)
Patient having active chest pain- stated that she is not feeling right  Recently had surgery

## 2018-02-16 NOTE — Telephone Encounter (Signed)
Pt c/o chest pain since last week with some SOB when up doing normal activities - pain went away at rest until today says she is sitting down and still having chest pain with nausea and some SOB - pt denies taking any NTG - advised pt she should be evaluated in ED - says she did have someone to take her - also suggested she could take a NTG on the way - pt voiced understanding

## 2018-02-17 ENCOUNTER — Encounter (HOSPITAL_COMMUNITY)
Admission: RE | Admit: 2018-02-17 | Discharge: 2018-02-17 | Disposition: A | Discharge status: Home / Self Care | Payer: BLUE CROSS/BLUE SHIELD | Source: Ambulatory Visit | Attending: Cardiovascular Disease | Admitting: Cardiovascular Disease

## 2018-02-17 ENCOUNTER — Encounter (HOSPITAL_COMMUNITY): Payer: BLUE CROSS/BLUE SHIELD

## 2018-02-17 DIAGNOSIS — Z8774 Personal history of (corrected) congenital malformations of heart and circulatory system: Secondary | ICD-10-CM

## 2018-02-17 NOTE — Progress Notes (Signed)
Daily Session Note  Patient Details  Name: Kristy Lewis MRN: [id number] Date of Birth: [birth year] Referring Provider:     CARDIAC REHAB PHASE II ORIENTATION from 02/10/2018 in [facility]  Referring Provider  Kristy Lewis      Encounter Date: 02/17/2018  Check In: Session Check In - 02/17/18 0930      Check-In   Supervising physician immediately available to respond to emergencies  See telemetry face sheet for immediately available MD    Location  [facility]    Staff Present  Benay Pike, Exercise Physiologist;Aldeen Riga Wynetta Emery, RN, BSN;Diane Coad, MS, EP, [facility], Exercise Physiologist    Medication changes reported      No    Fall or balance concerns reported     No    Warm-up and Cool-down  Performed as group-led instruction    Resistance Training Performed  Yes    VAD Patient?  No    PAD/SET Patient?  No      Pain Assessment   Currently in Pain?  No/denies    Pain Score  0-No pain    Multiple Pain Sites  No       Capillary Blood Glucose: No results found for this or any previous visit (from the past 24 hour(s)).    Social History   Tobacco Use  Smoking Status Former Smoker  . Packs/day: 0.50  . Last attempt to quit: 08/05/2017  . Years since quitting: 0.5  Smokeless Tobacco Never Used  Tobacco Comment   quit 2 yrs ago after smoking for over 20 yrs.    Goals Met:  Independence with exercise equipment Exercise tolerated well No report of cardiac concerns or symptoms Strength training completed today  Goals Unmet:  Not Applicable  Comments: Pt able to follow exercise prescription today without complaint.  Will continue to monitor for progression. Check out 1030.   Dr. Kate Sable is Medical Director for [facility] Cardiac and Pulmonary Rehab.

## 2018-02-19 ENCOUNTER — Encounter (HOSPITAL_COMMUNITY)
Admission: RE | Admit: 2018-02-19 | Discharge: 2018-02-19 | Disposition: A | Discharge status: Home / Self Care | Payer: BLUE CROSS/BLUE SHIELD | Source: Ambulatory Visit | Attending: Cardiovascular Disease | Admitting: Cardiovascular Disease

## 2018-02-19 ENCOUNTER — Encounter (HOSPITAL_COMMUNITY): Payer: BLUE CROSS/BLUE SHIELD

## 2018-02-19 DIAGNOSIS — Z8774 Personal history of (corrected) congenital malformations of heart and circulatory system: Secondary | ICD-10-CM | POA: Diagnosis not present

## 2018-02-19 NOTE — Progress Notes (Signed)
Daily Session Note  Patient Details  Name: Kristy Lewis MRN: [id number] Date of Birth: Mar 05, 1960 Referring Provider:     CARDIAC REHAB PHASE II ORIENTATION from 02/10/2018 in [facility]  Referring Provider  [name]      Encounter Date: 02/19/2018  Check In: Session Check In - 02/19/18 0930      Check-In   Supervising physician immediately available to respond to emergencies  See telemetry face sheet for immediately available MD    Location  [facility]    Staff Present  Russella Dar, MS, EP, [facility], Exercise Physiologist;Amanda Zachery Conch, Exercise Physiologist;Debra Wynetta Emery, RN, BSN    Medication changes reported      No    Fall or balance concerns reported     No    Warm-up and Cool-down  Performed as group-led Higher education careers adviser Performed  Yes    PAD/SET Patient?  No      Pain Assessment   Currently in Pain?  No/denies    Multiple Pain Sites  No       Capillary Blood Glucose: No results found for this or any previous visit (from the past 24 hour(s)).    Social History   Tobacco Use  Smoking Status Former Smoker  . Packs/day: 0.50  . Last attempt to quit: 08/05/2017  . Years since quitting: 0.5  Smokeless Tobacco Never Used  Tobacco Comment   quit 2 yrs ago after smoking for over 20 yrs.    Goals Met:  Independence with exercise equipment Improved SOB with ADL's Exercise tolerated well Personal goals reviewed No report of cardiac concerns or symptoms Strength training completed today  Goals Unmet:  Not Applicable  Comments: Check out: 1030   Dr. Kate Sable is Medical Director for [facility] and Pulmonary Rehab.

## 2018-02-22 ENCOUNTER — Encounter (HOSPITAL_COMMUNITY): Payer: BLUE CROSS/BLUE SHIELD

## 2018-02-22 ENCOUNTER — Encounter (HOSPITAL_COMMUNITY)
Admission: RE | Admit: 2018-02-22 | Discharge: 2018-02-22 | Disposition: A | Discharge status: Home / Self Care | Payer: BLUE CROSS/BLUE SHIELD | Source: Ambulatory Visit | Attending: Cardiovascular Disease | Admitting: Cardiovascular Disease

## 2018-02-22 DIAGNOSIS — Z8774 Personal history of (corrected) congenital malformations of heart and circulatory system: Secondary | ICD-10-CM | POA: Diagnosis not present

## 2018-02-22 NOTE — Progress Notes (Signed)
Daily Session Note  Patient Details  Name: Kristy Lewis MRN: [id number] Date of Birth: 1960/12/26 Referring Provider:     CARDIAC REHAB PHASE II ORIENTATION from 02/10/2018 in [place]  Referring Provider  [name]      Encounter Date: 02/22/2018  Check In: Session Check In - 02/22/18 0930      Check-In   Supervising physician immediately available to respond to emergencies  See telemetry face sheet for immediately available MD    Location  [facility]    Staff Present  Russella Dar, MS, EP, [facility], Exercise Physiologist;Amanda Zachery Conch, Exercise Physiologist;Dyllon Henken Wynetta Emery, RN, BSN    Medication changes reported      No    Fall or balance concerns reported     No    Warm-up and Cool-down  Performed as group-led instruction    Resistance Training Performed  Yes    VAD Patient?  No    PAD/SET Patient?  No      Pain Assessment   Currently in Pain?  No/denies    Pain Score  0-No pain    Multiple Pain Sites  No       Capillary Blood Glucose: No results found for this or any previous visit (from the past 24 hour(s)).    Social History   Tobacco Use  Smoking Status Former Smoker  . Packs/day: 0.50  . Last attempt to quit: 08/05/2017  . Years since quitting: 0.5  Smokeless Tobacco Never Used  Tobacco Comment   quit 2 yrs ago after smoking for over 20 yrs.    Goals Met:  Independence with exercise equipment Exercise tolerated well No report of cardiac concerns or symptoms Strength training completed today  Goals Unmet:  Not Applicable  Comments: Pt able to follow exercise prescription today without complaint.  Will continue to monitor for progression. Check out 1030.   Dr. Kate Sable is Medical Director for [facility] Cardiac and Pulmonary Rehab.

## 2018-02-24 ENCOUNTER — Encounter (HOSPITAL_COMMUNITY): Payer: BLUE CROSS/BLUE SHIELD

## 2018-02-25 NOTE — Progress Notes (Signed)
Cardiac Individual Treatment Plan  Patient Details  Name: Kristy Lewis MRN: [id number] Date of Birth: 04-Jan-1961 Referring Provider:     CARDIAC REHAB PHASE II ORIENTATION from 02/10/2018 in [place]  Referring Provider  [name]      Initial Encounter Date:    CARDIAC REHAB PHASE II ORIENTATION from 02/10/2018 in [place]  Date  02/10/18      Visit Diagnosis: ASD, spontaneous closure  Patient's Home Medications on Admission:  Current Outpatient Medications:  .  acetaminophen (TYLENOL) 500 MG tablet, Take 2 tablets (1,000 mg total) by mouth every 6 (six) hours as needed., Disp: 30 tablet, Rfl: 0 .  bisacodyl (DULCOLAX) 5 MG EC tablet, Take 5 mg by mouth daily as needed for moderate constipation., Disp: , Rfl:  .  Colchicine 0.6 MG/5ML SOLN, Take 1 tablet by mouth daily., Disp: , Rfl:  .  diazepam (VALIUM) 2 MG tablet, Take 2 mg by mouth 2 (two) times daily as needed for anxiety. , Disp: , Rfl:  .  FLUoxetine (PROZAC) 40 MG capsule, Take 40 mg by mouth daily. , Disp: , Rfl:  .  ibuprofen (ADVIL) 200 MG tablet, Take 200mg  by mouth twice a day x 7 days, then followed by 200mg  daily thereafter. (Patient taking differently: daily as needed for headache or mild pain. ), Disp: , Rfl: 0 .  Multiple Vitamin (MULTIVITAMIN WITH MINERALS) TABS tablet, Take 1 tablet by mouth daily., Disp: , Rfl:  .  nitroGLYCERIN (NITROSTAT) 0.4 MG SL tablet, Place 1 tablet (0.4 mg total) under the tongue every 5 (five) minutes as needed for chest pain., Disp: 30 tablet, Rfl: 0 .  SUMAtriptan (IMITREX) 50 MG tablet, Take 50 mg by mouth every 2 (two) hours as needed for migraine. May repeat in 2 hours if headache persists or recurs., Disp: , Rfl:   Past Medical History: Past Medical History:  Diagnosis Date  . Anginal pain ([place])    Comes and goes, pt states its normal to have every day   . ASD (atrial septal defect), sinus venosus defect   . Bipolar disorder  ([place])    pt denies  . Cardiac murmur   . Carpal tunnel syndrome   . Depressive disorder   . Dysphagia   . Dyspnea    Upon exertion  . Esophageal reflux   . Genital herpes    at age 58  . Hypercholesteremia   . Migraines   . 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 Urinary incontinence     Tobacco Use: Social History   Tobacco Use  Smoking Status Former Smoker  . Packs/day: 0.50  . Last attempt to quit: 08/05/2017  . Years since quitting: 0.5  Smokeless Tobacco Never Used  Tobacco Comment   quit 2 yrs ago after smoking for over 20 yrs.    Labs: Recent Review Flowsheet Data    Labs for ITP Cardiac and Pulmonary Rehab Latest Ref Rng & Units 10/21/2017 10/21/2017 10/21/2017 10/21/2017 10/22/2017   Hemoglobin A1c 4.8 - 5.6 % - - - - -   PHART 7.350 - 7.450 7.244(L) 7.350 7.339(L) - -   PCO2ART 32.0 - 48.0 mmHg 59.5(H) 49.5(H) 45.3 - -   HCO3 20.0 - 28.0 mmol/L 25.7 27.4 24.6 - -   TCO2 22 - 32 mmol/L 27 29 26 25 27    ACIDBASEDEF 0.0 - 2.0 mmol/L 3.0(H) - 2.0 - -   O2SAT %  96.0 98.0 98.0 - -      Capillary Blood Glucose: Lab Results  Component Value Date   GLUCAP 95 10/23/2017   GLUCAP 92 10/23/2017   GLUCAP 103 (H) 10/22/2017   GLUCAP 99 10/22/2017   GLUCAP 94 10/22/2017     Exercise Target Goals: Exercise Program Goal: Individual exercise prescription set using results from initial 6 min walk test and THRR while considering  patient's activity barriers and safety.   Exercise Prescription Goal: Starting with aerobic activity 30 plus minutes a day, 3 days per week for initial exercise prescription. Provide home exercise prescription and guidelines that participant acknowledges understanding prior to discharge.  Activity Barriers & Risk Stratification: Activity Barriers & Cardiac Risk Stratification - 02/10/18 0918      Activity Barriers & Cardiac Risk Stratification   Activity Barriers   None    Cardiac Risk Stratification  High       6 Minute Walk: 6 Minute Walk    Row Name 02/10/18 0917         6 Minute Walk   Phase  Initial     Distance  1400 feet     Walk Time  6 minutes     # of Rest Breaks  0     MPH  2.65     METS  3.03     RPE  10     Perceived Dyspnea   11     VO2 Peak  12.85     Symptoms  No     Resting HR  86 bpm     Resting BP  108/70     Resting Oxygen Saturation   97 %     Exercise Oxygen Saturation  during 6 min walk  97 %     Max Ex. HR  101 bpm     Max Ex. BP  124/72     2 Minute Post BP  110/68        Oxygen Initial Assessment:   Oxygen Re-Evaluation:   Oxygen Discharge (Final Oxygen Re-Evaluation):   Initial Exercise Prescription: Initial Exercise Prescription - 02/10/18 0900      Date of Initial Exercise RX and Referring Provider   Date  02/10/18    Referring Provider  [name]    Expected Discharge Date  05/12/18      Treadmill   MPH  1.8    Grade  0    Minutes  17    METs  2.37      Recumbant Elliptical   Level  1    RPM  42    Watts  57    Minutes  22    METs  3.1      Prescription Details   Frequency (times per week)  3    Duration  Progress to 30 minutes of continuous aerobic without signs/symptoms of physical distress      Intensity   THRR 40-80% of Max Heartrate  [id number]    Ratings of Perceived Exertion  11-13    Perceived Dyspnea  0-4      Progression   Progression  Continue to progress workloads to maintain intensity without signs/symptoms of physical distress.      Resistance Training   Training Prescription  Yes    Weight  1    Reps  10-15       Perform Capillary Blood Glucose checks as needed.  Exercise Prescription Changes:  Exercise Prescription Changes    Row Name 02/16/18 0900  Response to Exercise   Blood Pressure (Admit)  110/70       Blood Pressure (Exercise)  138/60       Blood Pressure (Exit)  104/64       Heart Rate (Admit)  77 bpm       Heart Rate  (Exercise)  98 bpm       Heart Rate (Exit)  86 bpm       Rating of Perceived Exertion (Exercise)  11       Comments  First day of exercise        Duration  Continue with 30 min of aerobic exercise without signs/symptoms of physical distress.       Intensity  THRR New [id number]         Progression   Progression  Continue to progress workloads to maintain intensity without signs/symptoms of physical distress.       Average METs  2.88         Resistance Training   Training Prescription  Yes       Weight  1       Reps  10-15         Treadmill   MPH  1.8       Grade  0       Minutes  17       METs  2.37         Recumbant Elliptical   Level  1       RPM  51       Watts  64       Minutes  22       METs  3.4         Home Exercise Plan   Plans to continue exercise at  Home (comment)       Frequency  Add 2 additional days to program exercise sessions.       Initial Home Exercises Provided  02/10/18          Exercise Comments:  Exercise Comments    Row Name 02/22/18 1459           Exercise Comments  Pt. has attended 5 sessions so far. She has shown to be tolerating the exercise well, even better than she thought. She has been able to make it the entire 17-22 minutes on the equipment. Other than her incisional pain she seems to be stringer than she thinks. Will continue to monitor and progress as needed.           Exercise Goals and Review:  Exercise Goals    Row Name 02/10/18 0919             Exercise Goals   Increase Physical Activity  Yes       Intervention  Provide advice, education, support and counseling about physical activity/exercise needs.;Develop an individualized exercise prescription for aerobic and resistive training based on initial evaluation findings, risk stratification, comorbidities and participant's personal goals.       Expected Outcomes  Short Term: Attend rehab on a regular basis to increase amount of physical activity.       Increase  Strength and Stamina  Yes       Intervention  Provide advice, education, support and counseling about physical activity/exercise needs.;Develop an individualized exercise prescription for aerobic and resistive training based on initial evaluation findings, risk stratification, comorbidities and participant's personal goals.       Expected Outcomes  Short Term: Increase workloads from initial  exercise prescription for resistance, speed, and METs.       Able to understand and use rate of perceived exertion (RPE) scale  Yes       Intervention  Provide education and explanation on how to use RPE scale       Expected Outcomes  Short Term: Able to use RPE daily in rehab to express subjective intensity level;Long Term:  Able to use RPE to guide intensity level when exercising independently       Able to understand and use Dyspnea scale  Yes       Intervention  Provide education and explanation on how to use Dyspnea scale       Expected Outcomes  Short Term: Able to use Dyspnea scale daily in rehab to express subjective sense of shortness of breath during exertion;Long Term: Able to use Dyspnea scale to guide intensity level when exercising independently       Knowledge and understanding of Target Heart Rate Range (THRR)  Yes       Intervention  Provide education and explanation of THRR including how the numbers were predicted and where they are located for reference       Expected Outcomes  Short Term: Able to state/look up THRR       Able to check pulse independently  Yes       Intervention  Provide education and demonstration on how to check pulse in carotid and radial arteries.;Review the importance of being able to check your own pulse for safety during independent exercise       Expected Outcomes  Short Term: Able to explain why pulse checking is important during independent exercise;Long Term: Able to check pulse independently and accurately       Understanding of Exercise Prescription  Yes        Intervention  Provide education, explanation, and written materials on patient's individual exercise prescription       Expected Outcomes  Short Term: Able to explain program exercise prescription;Long Term: Able to explain home exercise prescription to exercise independently          Exercise Goals Re-Evaluation : Exercise Goals Re-Evaluation    Row Name 02/22/18 1456             Exercise Goal Re-Evaluation   Exercise Goals Review  Increase Physical Activity;Increase Strength and Stamina;Able to understand and use rate of perceived exertion (RPE) scale;Knowledge and understanding of Target Heart Rate Range (THRR);Able to check pulse independently;Understanding of Exercise Prescription       Comments  Pt. is new to the program, she has attended 5 sessions so far. She is doing well with the exercise and has been able to last the alotted times on the machines, which she was worried about in the beginning. We will continue to monitor and progess as tolerated.        Expected Outcomes  increase strength.            Discharge Exercise Prescription (Final Exercise Prescription Changes): Exercise Prescription Changes - 02/16/18 0900      Response to Exercise   Blood Pressure (Admit)  110/70    Blood Pressure (Exercise)  138/60    Blood Pressure (Exit)  104/64    Heart Rate (Admit)  77 bpm    Heart Rate (Exercise)  98 bpm    Heart Rate (Exit)  86 bpm    Rating of Perceived Exertion (Exercise)  11    Comments  First day of exercise  Duration  Continue with 30 min of aerobic exercise without signs/symptoms of physical distress.    Intensity  THRR New   [id number]     Progression   Progression  Continue to progress workloads to maintain intensity without signs/symptoms of physical distress.    Average METs  2.88      Resistance Training   Training Prescription  Yes    Weight  1    Reps  10-15      Treadmill   MPH  1.8    Grade  0    Minutes  17    METs  2.37       Recumbant Elliptical   Level  1    RPM  51    Watts  64    Minutes  22    METs  3.4      Home Exercise Plan   Plans to continue exercise at  Home (comment)    Frequency  Add 2 additional days to program exercise sessions.    Initial Home Exercises Provided  02/10/18       Nutrition:  Target Goals: Understanding of nutrition guidelines, daily intake of sodium 1500mg , cholesterol 200mg , calories 30% from fat and 7% or less from saturated fats, daily to have 5 or more servings of fruits and vegetables.  Biometrics: Pre Biometrics - 02/10/18 0920      Pre Biometrics   Height  5\' 6"  (1.676 m)    Waist Circumference  37 inches    Hip Circumference  40 inches    Waist to Hip Ratio  0.92 %    Triceps Skinfold  23 mm    % Body Fat  38.1 %    Grip Strength  8.7 kg    Single Leg Stand  21 seconds        Nutrition Therapy Plan and Nutrition Goals: Nutrition Therapy & Goals - 02/25/18 0747      Nutrition Therapy   RD appointment deferred  Yes      Personal Nutrition Goals   Comments  Patient was reminded about RD meeting today. She states she eats a lot of salads and high protein food. She eats 2 meals/day and has snacks like peanut butter crackers throughout the day. Will continue to monitor for progress.       Intervention Plan   Intervention  Nutrition handout(s) given to patient.       Nutrition Assessments: Nutrition Assessments - 02/10/18 1137      MEDFICTS Scores   Pre Score  42       Nutrition Goals Re-Evaluation:   Nutrition Goals Discharge (Final Nutrition Goals Re-Evaluation):   Psychosocial: Target Goals: Acknowledge presence or absence of significant depression and/or stress, maximize coping skills, provide positive support system. Participant is able to verbalize types and ability to use techniques and skills needed for reducing stress and depression.  Initial Review & Psychosocial Screening: Initial Psych Review & Screening - 02/10/18 1134       Initial Review   Current issues with  History of Depression      Family Dynamics   Good Support System?  Yes      Barriers   Psychosocial barriers to participate in program  There are no identifiable barriers or psychosocial needs.      Screening Interventions   Interventions  Encouraged to exercise    Expected Outcomes  Short Term goal: Identification and review with participant of any Quality of Life or Depression concerns found by  scoring the questionnaire.;Long Term goal: The participant improves quality of Life and PHQ9 Scores as seen by post scores and/or verbalization of changes       Quality of Life Scores: Quality of Life - 02/10/18 0921      Quality of Life   Select  Quality of Life      Quality of Life Scores   Health/Function Pre  11.67 %    Socioeconomic Pre  16.31 %    Psych/Spiritual Pre  20.36 %    Family Pre  20.5 %    GLOBAL Pre  15.73 %      Scores of 19 and below usually indicate a poorer quality of life in these areas.  A difference of  2-3 points is a clinically meaningful difference.  A difference of 2-3 points in the total score of the Quality of Life Index has been associated with significant improvement in overall quality of life, self-image, physical symptoms, and general health in studies assessing change in quality of life.  PHQ-9: Recent Review Flowsheet Data    Depression screen [facility] 2/9 02/10/2018   Decreased Interest 0   Down, Depressed, Hopeless 1   PHQ - 2 Score 1   Altered sleeping 0   Tired, decreased energy 1   Change in appetite 1   Feeling bad or failure about yourself  0   Trouble concentrating 1   Moving slowly or fidgety/restless 0   Suicidal thoughts 0   PHQ-9 Score 4   Difficult doing work/chores Not difficult at all     Interpretation of Total Score  Total Score Depression Severity:  1-4 = Minimal depression, 5-9 = Mild depression, 10-14 = Moderate depression, 15-19 = Moderately severe depression, 20-27 = Severe depression    Psychosocial Evaluation and Intervention: Psychosocial Evaluation - 02/10/18 1135      Psychosocial Evaluation & Interventions   Interventions  Encouraged to exercise with the program and follow exercise prescription    Continue Psychosocial Services   No Follow up required       Psychosocial Re-Evaluation: Psychosocial Re-Evaluation    [place] Name 02/25/18 0758             Psychosocial Re-Evaluation   Current issues with  History of Depression       Comments  Patient's initial QOL score was 15.73 and her PHQ-9 score was 2 with no psychosocial barriers identified. She does have a h/o depression and is currently on Fluoxetine 40 mg daily and diazepam 2 mg prn. Will continue to monitor.        Expected Outcomes  Patient will have improved QOL scores and no psychosocial issues identified at discharge.        Interventions  Stress management education;Relaxation education;Encouraged to attend Cardiac Rehabilitation for the exercise       Continue Psychosocial Services   No Follow up required          Psychosocial Discharge (Final Psychosocial Re-Evaluation): Psychosocial Re-Evaluation - 02/25/18 0758      Psychosocial Re-Evaluation   Current issues with  History of Depression    Comments  Patient's initial QOL score was 15.73 and her PHQ-9 score was 2 with no psychosocial barriers identified. She does have a h/o depression and is currently on Fluoxetine 40 mg daily and diazepam 2 mg prn. Will continue to monitor.     Expected Outcomes  Patient will have improved QOL scores and no psychosocial issues identified at discharge.     Interventions  Stress  management education;Relaxation education;Encouraged to attend Cardiac Rehabilitation for the exercise    Continue Psychosocial Services   No Follow up required       Vocational Rehabilitation: Provide vocational rehab assistance to qualifying candidates.   Vocational Rehab Evaluation & Intervention: Vocational Rehab - 02/10/18 1140       Initial Vocational Rehab Evaluation & Intervention   Assessment shows need for Vocational Rehabilitation  No       Education: Education Goals: Education classes will be provided on a weekly basis, covering required topics. Participant will state understanding/return demonstration of topics presented.  Learning Barriers/Preferences: Learning Barriers/Preferences - 02/10/18 1139      Learning Barriers/Preferences   Learning Barriers  None    Learning Preferences  Individual Instruction;Group Instruction;Written Material;Pictoral;Skilled Demonstration       Education Topics: Hypertension, Hypertension Reduction -Define heart disease and high blood pressure. Discus how high blood pressure affects the body and ways to reduce high blood pressure.   Exercise and Your Heart -Discuss why it is important to exercise, the FITT principles of exercise, normal and abnormal responses to exercise, and how to exercise safely.   Angina -Discuss definition of angina, causes of angina, treatment of angina, and how to decrease risk of having angina.   Cardiac Medications -Review what the following cardiac medications are used for, how they affect the body, and side effects that may occur when taking the medications.  Medications include Aspirin, Beta blockers, calcium channel blockers, ACE Inhibitors, angiotensin receptor blockers, diuretics, digoxin, and antihyperlipidemics.   Congestive Heart Failure -Discuss the definition of CHF, how to live with CHF, the signs and symptoms of CHF, and how keep track of weight and sodium intake.   Heart Disease and Intimacy -Discus the effect sexual activity has on the heart, how changes occur during intimacy as we age, and safety during sexual activity.   Smoking Cessation / COPD -Discuss different methods to quit smoking, the health benefits of quitting smoking, and the definition of COPD.   Nutrition I: Fats -Discuss the types of cholesterol,  what cholesterol does to the heart, and how cholesterol levels can be controlled.   Nutrition II: Labels -Discuss the different components of food labels and how to read food label   Heart Parts/Heart Disease and PAD -Discuss the anatomy of the heart, the pathway of blood circulation through the heart, and these are affected by heart disease.   Stress I: Signs and Symptoms -Discuss the causes of stress, how stress may lead to anxiety and depression, and ways to limit stress.   Stress II: Relaxation -Discuss different types of relaxation techniques to limit stress.   Warning Signs of Stroke / TIA -Discuss definition of a stroke, what the signs and symptoms are of a stroke, and how to identify when someone is having stroke.   CARDIAC REHAB PHASE II EXERCISE from 02/17/2018 in [place]  Date  02/17/18  Educator  Coad  Instruction Review Code  2- Demonstrated Understanding      Knowledge Questionnaire Score: Knowledge Questionnaire Score - 02/10/18 1140      Knowledge Questionnaire Score   Pre Score  22/24       Core Components/Risk Factors/Patient Goals at Admission: Personal Goals and Risk Factors at Admission - 02/10/18 1141      Core Components/Risk Factors/Patient Goals on Admission    Weight Management  Yes    Intervention  Weight Management/Obesity: Establish reasonable short term and long term weight goals.    Admit  Weight  175 lb 12.8 oz (79.7 kg)    Goal Weight: Short Term  170 lb 12.8 oz (77.5 kg)    Goal Weight: Long Term  165 lb 12.8 oz (75.2 kg)    Expected Outcomes  Short Term: Continue to assess and modify interventions until short term weight is achieved;Long Term: Adherence to nutrition and physical activity/exercise program aimed toward attainment of established weight goal    Personal Goal Other  Yes    Personal Goal  Be stronger, Look and feel better    Intervention  Attend CR class 3x week and supplement with home exercise 2 x  week    Expected Outcomes  Reach personal goals       Core Components/Risk Factors/Patient Goals Review:  Goals and Risk Factor Review    Row Name 02/25/18 0752             Core Components/Risk Factors/Patient Goals Review   Personal Goals Review  Weight Management/Obesity Be stronger; look and feel better.        Review  Patient has completed 5 sessions maintaining her weight since her initial visit. She is new to the program. Will continue to monitor for progress.        Expected Outcomes  Patient will continue to attend sessions and complete the program.           Core Components/Risk Factors/Patient Goals at Discharge (Final Review):  Goals and Risk Factor Review - 02/25/18 0752      Core Components/Risk Factors/Patient Goals Review   Personal Goals Review  Weight Management/Obesity   Be stronger; look and feel better.    Review  Patient has completed 5 sessions maintaining her weight since her initial visit. She is new to the program. Will continue to monitor for progress.     Expected Outcomes  Patient will continue to attend sessions and complete the program.        ITP Comments:   Comments: ITP REVIEW Patient is doing well in the program. Will continue to monitor for progress.

## 2018-02-26 ENCOUNTER — Encounter (HOSPITAL_COMMUNITY): Payer: BLUE CROSS/BLUE SHIELD

## 2018-02-26 ENCOUNTER — Encounter (HOSPITAL_COMMUNITY)
Admission: RE | Admit: 2018-02-26 | Discharge: 2018-02-26 | Disposition: A | Discharge status: Home / Self Care | Payer: BLUE CROSS/BLUE SHIELD | Source: Ambulatory Visit | Attending: Cardiovascular Disease | Admitting: Cardiovascular Disease

## 2018-02-26 ENCOUNTER — Ambulatory Visit: Payer: BLUE CROSS/BLUE SHIELD | Admitting: Cardiovascular Disease

## 2018-02-26 ENCOUNTER — Encounter: Payer: Self-pay | Admitting: Cardiovascular Disease

## 2018-02-26 VITALS — BP 112/82 | HR 74 | Ht 66.0 in | Wt 176.0 lb

## 2018-02-26 DIAGNOSIS — K219 Gastro-esophageal reflux disease without esophagitis: Secondary | ICD-10-CM

## 2018-02-26 DIAGNOSIS — I319 Disease of pericardium, unspecified: Secondary | ICD-10-CM

## 2018-02-26 DIAGNOSIS — I313 Pericardial effusion (noninflammatory): Secondary | ICD-10-CM | POA: Diagnosis not present

## 2018-02-26 DIAGNOSIS — Z8774 Personal history of (corrected) congenital malformations of heart and circulatory system: Secondary | ICD-10-CM

## 2018-02-26 DIAGNOSIS — I3139 Other pericardial effusion (noninflammatory): Secondary | ICD-10-CM

## 2018-02-26 MED ORDER — OMEPRAZOLE 20 MG PO CPDR: 20.0000 mg | DELAYED_RELEASE_CAPSULE | Freq: Every day | ORAL | 3 refills | Status: DC

## 2018-02-26 NOTE — Progress Notes (Signed)
Daily Session Note  Patient Details  Name: Kristy Lewis MRN: [id number] Date of Birth: 15-Apr-1960 Referring Provider:     CARDIAC REHAB PHASE II ORIENTATION from 02/10/2018 in [place]  Referring Provider  [name]      Encounter Date: 02/26/2018  Check In: Session Check In - 02/26/18 0930      Check-In   Supervising physician immediately available to respond to emergencies  See telemetry face sheet for immediately available MD    Location  [facility]    Staff Present  Russella Dar, MS, EP, [facility], Exercise Physiologist;Amanda Zachery Conch, Exercise Physiologist;Jaia Alonge Wynetta Emery, RN, BSN    Medication changes reported      No    Fall or balance concerns reported     No    Warm-up and Cool-down  Performed as group-led instruction    Resistance Training Performed  Yes    VAD Patient?  No    PAD/SET Patient?  No      Pain Assessment   Currently in Pain?  No/denies    Pain Score  0-No pain    Multiple Pain Sites  No       Capillary Blood Glucose: No results found for this or any previous visit (from the past 24 hour(s)).    Social History   Tobacco Use  Smoking Status Former Smoker  . Packs/day: 0.50  . Last attempt to quit: 08/05/2017  . Years since quitting: 0.5  Smokeless Tobacco Never Used  Tobacco Comment   quit 2 yrs ago after smoking for over 20 yrs.    Goals Met:  Independence with exercise equipment Exercise tolerated well No report of cardiac concerns or symptoms Strength training completed today  Goals Unmet:  Not Applicable  Comments: Pt able to follow exercise prescription today without complaint.  Will continue to monitor for progression. Check out 1030.   Dr. Kate Sable is Medical Director for [facility].

## 2018-02-26 NOTE — Progress Notes (Signed)
SUBJECTIVE: The patient presents for routine follow-up.  She has been participating in cardiac rehabilitation and has done well with this.  She did call our office on 02/16/2018 complaining of chest pain with some shortness of breath with some activities.  She previously had what appeared to be pericarditis for which I treated her with colchicine.  She had a pericardial effusion which has decreased in size by most recent echocardiogram.  Echocardiogram on 01/08/2018 showed normal left ventricular systolic function, LVEF 55 to 57%, normal diastolic function, and a small circumferential pericardial effusion which appeared to be significantly smaller compared to the study on 12/24/2017.  She has had episodic chest pain and shortness of breath but symptoms have improved significantly since December.  She did take some ibuprofen this morning but only takes it when she needs it.  She has had some nausea but denies vomiting.  She has taken Tums with some relief.  She also complains of right breast enlargement.  She denies fevers and nipple discharge.  She denies palpitations and leg swelling.  She feels like her energy levels are improving.  She has gone back to work on a limited basis and sees four customers per day.     Review of Systems: As per "subjective", otherwise negative.  Allergies  Allergen Reactions  . Iodine Swelling and Rash    SWELLING REACTION UNSPECIFIED   . Morphine And Related Itching and Other (See Comments)    Altered mental status "mean"  . Shellfish Allergy Swelling and Rash    SWELLING REACTION UNSPECIFIED     Current Outpatient Medications  Medication Sig Dispense Refill  . acetaminophen (TYLENOL) 500 MG tablet Take 2 tablets (1,000 mg total) by mouth every 6 (six) hours as needed. 30 tablet 0  . bisacodyl (DULCOLAX) 5 MG EC tablet Take 5 mg by mouth daily as needed for moderate constipation.    . diazepam (VALIUM) 2 MG tablet Take 2 mg by mouth 2 (two)  times daily as needed for anxiety.     Marland Kitchen FLUoxetine (PROZAC) 40 MG capsule Take 40 mg by mouth daily.     Marland Kitchen ibuprofen (ADVIL) 200 MG tablet Take 200mg  by mouth twice a day x 7 days, then followed by 200mg  daily thereafter. (Patient taking differently: daily as needed for headache or mild pain. )  0  . Multiple Vitamin (MULTIVITAMIN WITH MINERALS) TABS tablet Take 1 tablet by mouth daily.    . nitroGLYCERIN (NITROSTAT) 0.4 MG SL tablet Place 1 tablet (0.4 mg total) under the tongue every 5 (five) minutes as needed for chest pain. 30 tablet 0  . SUMAtriptan (IMITREX) 50 MG tablet Take 50 mg by mouth every 2 (two) hours as needed for migraine. May repeat in 2 hours if headache persists or recurs.     No current facility-administered medications for this visit.     Past Medical History:  Diagnosis Date  . Anginal pain ([place])    Comes and goes, pt states its normal to have every day   . ASD (atrial septal defect), sinus venosus defect   . Bipolar disorder ([place])    pt denies  . Cardiac murmur   . Carpal tunnel syndrome   . Depressive disorder   . Dysphagia   . Dyspnea    Upon exertion  . Esophageal reflux   . Genital herpes    at age 77  . Hypercholesteremia   . Migraines   . 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 Urinary incontinence     Past Surgical History:  Procedure Laterality Date  . ABDOMINAL HYSTERECTOMY     with bladder surgery. Ovaries remained  . ASD REPAIR N/A 10/21/2017   Procedure: MINIMALLY INVASIVE REPAIR OF SINUS VENOSUS ATRIAL SEPTAL DEFECT (ASD), CORRECTION OF PARTIAL ANOMALOUS PULMONARY VENOUS RETURN, CLOSURE OF PATENT FORAMEN OVALE;  Surgeon: Rexene Alberts, MD;  Location: [facility];  Service: Open Heart Surgery;  Laterality: N/A;  . COLONOSCOPY  10/15/2011   Procedure: COLONOSCOPY;  Surgeon: Rogene Houston, MD;  [facility];  Service: Endoscopy;  Laterality: N/A;  200  .  HEMORRHOID SURGERY    . left shoulder manipulation    . RIGHT/LEFT HEART CATH AND CORONARY ANGIOGRAPHY N/A 08/05/2017   Procedure: RIGHT/LEFT HEART CATH AND CORONARY ANGIOGRAPHY;  Surgeon: Belva Crome, MD;  Location: [facility];  Service: Cardiovascular;  Laterality: N/A;  . TEE WITHOUT CARDIOVERSION N/A 10/21/2017   Procedure: TRANSESOPHAGEAL ECHOCARDIOGRAM (TEE);  Surgeon: Rexene Alberts, MD;  Location: [facility];  Service: Open Heart Surgery;  Laterality: N/A;  . TONSILLECTOMY      Social History   Socioeconomic History  . Marital status: Divorced    Spouse name: Not on file  . Number of children: Not on file  . Years of education: Not on file  . Highest education level: Not on file  Occupational History  . Occupation: Hair Dresser  Social Needs  . Financial resource strain: Not on file  . Food insecurity:    Worry: Not on file    Inability: Not on file  . Transportation needs:    Medical: Not on file    Non-medical: Not on file  Tobacco Use  . Smoking status: Former Smoker    Packs/day: 0.50    Last attempt to quit: 08/05/2017    Years since quitting: 0.5  . Smokeless tobacco: Never Used  . Tobacco comment: quit 2 yrs ago after smoking for over 20 yrs.  Substance and Sexual Activity  . Alcohol use: Not Currently    Alcohol/week: 2.0 - 3.0 standard drinks    Types: 2 - 3 Glasses of wine per week    Comment: socially  . Drug use: No  . Sexual activity: Not on file  Lifestyle  . Physical activity:    Days per week: Not on file    Minutes per session: Not on file  . Stress: Not on file  Relationships  . Social connections:    Talks on phone: Not on file    Gets together: Not on file    Attends religious service: Not on file    Active member of club or organization: Not on file    Attends meetings of clubs or organizations: Not on file    Relationship status: Not on file  . Intimate partner violence:    Fear of current or ex partner: Not on file     Emotionally abused: Not on file    Physically abused: Not on file    Forced sexual activity: Not on file  Other Topics Concern  . Not on file  Social History Narrative  . Not on file     Vitals:   02/26/18 1047  BP: 112/82  Pulse: 74  SpO2: 94%  Weight: 176 lb (79.8 kg)  Height: 5\' 6"  (1.676 m)    Wt Readings from Last 3 Encounters:  02/26/18 176 lb (79.8 kg)  02/10/18 175 lb 12.8 oz (79.7 kg)  01/18/18 172 lb 12.8 oz (78.4 kg)     PHYSICAL EXAM General: NAD HEENT: Normal. Neck: No JVD, no thyromegaly. Lungs: Clear to auscultation bilaterally with normal respiratory effort. CV: Regular rate and rhythm, normal S1/S2, no S3/S4, no murmur. No pretibial or periankle edema.  Abdomen: Soft, nontender, no distention.  Neurologic: Alert and oriented.  Psych: Normal affect. Skin: Normal. Musculoskeletal: No gross deformities.    ECG: Reviewed above under Subjective   Labs: Lab Results  Component Value Date/Time   K 4.1 01/01/2018 11:56 AM   BUN 14 01/01/2018 11:56 AM   CREATININE 0.71 01/01/2018 11:56 AM   ALT 17 10/19/2017 10:48 AM   HGB 12.3 12/11/2017 12:56 PM     Lipids: No results found for: LDLCALC, LDLDIRECT, CHOL, TRIG, HDL     ASSESSMENT AND PLAN: 1.  Pericardial effusion with pericarditis: Most recent echocardiogram reviewed above demonstrating a small circumferential effusion which has significantly decreased in size when compared to prior echocardiogram.  2. ASD repair/PFO closure: She underwentminimally invasive repair of the sinus venosus ASD, correction of partial anomalous pulmonary venous return, andclosure of a PFOon 10/21/2017.   3.  GERD: She had been on omeprazole but has been off of it for some time.  She has been on anti-inflammatories for some time for pericarditis.  I will prescribe omeprazole 20 mg daily.   Disposition: Follow up 3 months   Kate Sable, M.D., F.A.C.C.

## 2018-02-26 NOTE — Patient Instructions (Signed)
Medication Instructions:  START Omeprazole 20 mg daily  If you need a refill on your cardiac medications before your next appointment, please call your pharmacy.   Lab work: None If you have labs (blood work) drawn today and your tests are completely normal, you will receive your results only by: Marland Kitchen MyChart Message (if you have MyChart) OR . A paper copy in the mail If you have any lab test that is abnormal or we need to change your treatment, we will call you to review the results.  Testing/Procedures: None  Follow-Up: At [facility], you and your health needs are our priority.  As part of our continuing mission to provide you with exceptional heart care, we have created designated Provider Care Teams.  These Care Teams include your primary Cardiologist (physician) and Advanced Practice Providers (APPs -  Physician Assistants and Nurse Practitioners) who all work together to provide you with the care you need, when you need it. You will need a follow up appointment in 3 months.  Please call our office 2 months in advance to schedule this appointment.  You may see Kate Sable, MD or one of the following Advanced Practice Providers on your designated Care Team:   Bernerd Pho, PA-C [facility]) . Ermalinda Barrios, PA-C ([place])  Any Other Special Instructions Will Be Listed Below (If Applicable). None

## 2018-03-01 ENCOUNTER — Encounter (HOSPITAL_COMMUNITY)
Admission: RE | Admit: 2018-03-01 | Discharge: 2018-03-01 | Disposition: A | Discharge status: Home / Self Care | Payer: BLUE CROSS/BLUE SHIELD | Source: Ambulatory Visit | Attending: Cardiovascular Disease | Admitting: Cardiovascular Disease

## 2018-03-01 ENCOUNTER — Encounter (HOSPITAL_COMMUNITY): Payer: BLUE CROSS/BLUE SHIELD

## 2018-03-01 DIAGNOSIS — Z8774 Personal history of (corrected) congenital malformations of heart and circulatory system: Secondary | ICD-10-CM | POA: Diagnosis not present

## 2018-03-01 NOTE — Progress Notes (Signed)
Daily Session Note  Patient Details  Name: Kristy Lewis MRN: [id number] Date of Birth: 04-12-1960 Referring Provider:     CARDIAC REHAB PHASE II ORIENTATION from 02/10/2018 in [place]  Referring Provider  [name]      Encounter Date: 03/01/2018  Check In: Session Check In - 03/01/18 0930      Check-In   Supervising physician immediately available to respond to emergencies  See telemetry face sheet for immediately available MD    Location  [facility]    Staff Present  Russella Dar, MS, EP, [facility], Exercise Physiologist;Hisashi Amadon Zachery Conch, Exercise Physiologist    Medication changes reported      No    Fall or balance concerns reported     No    Warm-up and Cool-down  Performed as group-led instruction    Resistance Training Performed  Yes    VAD Patient?  No    PAD/SET Patient?  No      Pain Assessment   Currently in Pain?  No/denies    Pain Score  0-No pain    Multiple Pain Sites  No       Capillary Blood Glucose: No results found for this or any previous visit (from the past 24 hour(s)).    Social History   Tobacco Use  Smoking Status Former Smoker  . Packs/day: 0.50  . Last attempt to quit: 08/05/2017  . Years since quitting: 0.5  Smokeless Tobacco Never Used  Tobacco Comment   quit 2 yrs ago after smoking for over 20 yrs.    Goals Met:  Proper associated with RPD/PD & O2 Sat Independence with exercise equipment Exercise tolerated well No report of cardiac concerns or symptoms Strength training completed today  Goals Unmet:  Not Applicable  Comments: Pt able to follow exercise prescription today without complaint.  Will continue to monitor for progression. Check out 1030.   Dr. Kate Sable is Medical Director for [facility] Cardiac and Pulmonary Rehab.

## 2018-03-03 ENCOUNTER — Encounter (HOSPITAL_COMMUNITY): Payer: BLUE CROSS/BLUE SHIELD

## 2018-03-05 ENCOUNTER — Encounter (HOSPITAL_COMMUNITY): Payer: BLUE CROSS/BLUE SHIELD

## 2018-03-08 ENCOUNTER — Encounter (HOSPITAL_COMMUNITY): Payer: BLUE CROSS/BLUE SHIELD

## 2018-03-10 ENCOUNTER — Encounter (HOSPITAL_COMMUNITY): Payer: BLUE CROSS/BLUE SHIELD

## 2018-03-12 ENCOUNTER — Encounter (HOSPITAL_COMMUNITY): Payer: BLUE CROSS/BLUE SHIELD

## 2018-03-15 ENCOUNTER — Encounter (HOSPITAL_COMMUNITY): Payer: BLUE CROSS/BLUE SHIELD

## 2018-03-17 ENCOUNTER — Encounter (HOSPITAL_COMMUNITY)
Admission: RE | Admit: 2018-03-17 | Discharge: 2018-03-17 | Disposition: A | Discharge status: Home / Self Care | Payer: BLUE CROSS/BLUE SHIELD | Source: Ambulatory Visit | Attending: Cardiovascular Disease | Admitting: Cardiovascular Disease

## 2018-03-17 ENCOUNTER — Encounter (HOSPITAL_COMMUNITY): Payer: BLUE CROSS/BLUE SHIELD

## 2018-03-17 DIAGNOSIS — Z8774 Personal history of (corrected) congenital malformations of heart and circulatory system: Secondary | ICD-10-CM | POA: Diagnosis not present

## 2018-03-17 NOTE — Progress Notes (Signed)
Daily Session Note  Patient Details  Name: Kristy Lewis MRN: [id number] Date of Birth: [birth year] Referring Provider:     CARDIAC REHAB PHASE II ORIENTATION from 02/10/2018 in [place]  Referring Provider  [name]      Encounter Date: 03/17/2018  Check In: Session Check In - 03/17/18 0930      Check-In   Supervising physician immediately available to respond to emergencies  See telemetry face sheet for immediately available MD    Location  [facility]    Staff Present  Benay Pike, Exercise Physiologist;Debra Wynetta Emery, RN, BSN    Medication changes reported      No    Fall or balance concerns reported     No    Warm-up and Cool-down  Performed as group-led instruction    Resistance Training Performed  Yes    VAD Patient?  No    PAD/SET Patient?  No      Pain Assessment   Currently in Pain?  No/denies    Pain Score  0-No pain    Multiple Pain Sites  No       Capillary Blood Glucose: No results found for this or any previous visit (from the past 24 hour(s)).    Social History   Tobacco Use  Smoking Status Former Smoker  . Packs/day: 0.50  . Last attempt to quit: 08/05/2017  . Years since quitting: 0.6  Smokeless Tobacco Never Used  Tobacco Comment   quit 2 yrs ago after smoking for over 20 yrs.    Goals Met:  Proper associated with RPD/PD & O2 Sat Independence with exercise equipment Exercise tolerated well No report of cardiac concerns or symptoms Strength training completed today  Goals Unmet:  Not Applicable  Comments: Pt able to follow exercise prescription today without complaint.  Will continue to monitor for progression. Check out 1030.   Dr. Kate Sable is Medical Director for [facility] Cardiac and Pulmonary Rehab.

## 2018-03-19 ENCOUNTER — Encounter (HOSPITAL_COMMUNITY)
Admission: RE | Admit: 2018-03-19 | Discharge: 2018-03-19 | Disposition: A | Discharge status: Home / Self Care | Payer: BLUE CROSS/BLUE SHIELD | Source: Ambulatory Visit | Attending: Cardiovascular Disease | Admitting: Cardiovascular Disease

## 2018-03-19 ENCOUNTER — Encounter (HOSPITAL_COMMUNITY): Payer: BLUE CROSS/BLUE SHIELD

## 2018-03-19 DIAGNOSIS — Z8774 Personal history of (corrected) congenital malformations of heart and circulatory system: Secondary | ICD-10-CM | POA: Diagnosis not present

## 2018-03-19 NOTE — Progress Notes (Signed)
Daily Session Note  Patient Details  Name: Kristy Lewis MRN: [id number] Date of Birth: Apr 23, 1960 Referring Provider:     CARDIAC REHAB PHASE II ORIENTATION from 02/10/2018 in [place]  Referring Provider  [name]      Encounter Date: 03/19/2018  Check In: Session Check In - 03/19/18 0930      Check-In   Supervising physician immediately available to respond to emergencies  See telemetry face sheet for immediately available MD    Location  [facility]    Staff Present  Aundra Dubin, RN, BSN;Diane Coad, MS, EP, [facility], Exercise Physiologist;Other    Medication changes reported      No    Fall or balance concerns reported     No    Warm-up and Cool-down  Performed as group-led instruction    Resistance Training Performed  Yes    VAD Patient?  No    PAD/SET Patient?  No      Pain Assessment   Currently in Pain?  No/denies    Pain Score  0-No pain    Multiple Pain Sites  No       Capillary Blood Glucose: No results found for this or any previous visit (from the past 24 hour(s)).    Social History   Tobacco Use  Smoking Status Former Smoker  . Packs/day: 0.50  . Last attempt to quit: 08/05/2017  . Years since quitting: 0.6  Smokeless Tobacco Never Used  Tobacco Comment   quit 2 yrs ago after smoking for over 20 yrs.    Goals Met:  Independence with exercise equipment Exercise tolerated well No report of cardiac concerns or symptoms Strength training completed today  Goals Unmet:  Not Applicable  Comments: Pt able to follow exercise prescription today without complaint.  Will continue to monitor for progression. Check out 1030.   Dr. Kate Sable is Medical Director for [facility] Cardiac and Pulmonary Rehab.

## 2018-03-22 ENCOUNTER — Encounter (HOSPITAL_COMMUNITY): Payer: BLUE CROSS/BLUE SHIELD

## 2018-03-22 ENCOUNTER — Encounter (HOSPITAL_COMMUNITY)
Admission: RE | Admit: 2018-03-22 | Discharge: 2018-03-22 | Disposition: A | Discharge status: Home / Self Care | Payer: BLUE CROSS/BLUE SHIELD | Source: Ambulatory Visit | Attending: Cardiovascular Disease | Admitting: Cardiovascular Disease

## 2018-03-22 DIAGNOSIS — Z8774 Personal history of (corrected) congenital malformations of heart and circulatory system: Secondary | ICD-10-CM | POA: Insufficient documentation

## 2018-03-22 NOTE — Progress Notes (Signed)
Daily Session Note  Patient Details  Name: Kristy Lewis MRN: [id number] Date of Birth: 11-26-1960 Referring Provider:     CARDIAC REHAB PHASE II ORIENTATION from 02/10/2018 in [place]  Referring Provider  [name]      Encounter Date: 03/22/2018  Check In: Session Check In - 03/22/18 0930      Check-In   Supervising physician immediately available to respond to emergencies  See telemetry face sheet for immediately available MD    Location  [facility]    Staff Present  Aundra Dubin, RN, BSN;Diane Coad, MS, EP, [facility], Exercise Physiologist;Nazar Kuan Zachery Conch, Exercise Physiologist    Medication changes reported      No    Fall or balance concerns reported     No    Warm-up and Cool-down  Performed as group-led instruction    Resistance Training Performed  Yes    VAD Patient?  No    PAD/SET Patient?  No      Pain Assessment   Currently in Pain?  No/denies    Pain Score  0-No pain    Multiple Pain Sites  No       Capillary Blood Glucose: No results found for this or any previous visit (from the past 24 hour(s)).    Social History   Tobacco Use  Smoking Status Former Smoker  . Packs/day: 0.50  . Last attempt to quit: 08/05/2017  . Years since quitting: 0.6  Smokeless Tobacco Never Used  Tobacco Comment   quit 2 yrs ago after smoking for over 20 yrs.    Goals Met:  Proper associated with RPD/PD & O2 Sat Independence with exercise equipment Exercise tolerated well No report of cardiac concerns or symptoms Strength training completed today  Goals Unmet:  Not Applicable  Comments: Pt able to follow exercise prescription today without complaint.  Will continue to monitor for progression. Check out 1030.   Dr. Kate Sable is Medical Director for [facility] Cardiac and Pulmonary Rehab.

## 2018-03-24 ENCOUNTER — Encounter (HOSPITAL_COMMUNITY)
Admission: RE | Admit: 2018-03-24 | Discharge: 2018-03-24 | Disposition: A | Discharge status: Home / Self Care | Payer: BLUE CROSS/BLUE SHIELD | Source: Ambulatory Visit | Attending: Cardiovascular Disease | Admitting: Cardiovascular Disease

## 2018-03-24 ENCOUNTER — Encounter (HOSPITAL_COMMUNITY): Payer: BLUE CROSS/BLUE SHIELD

## 2018-03-24 DIAGNOSIS — Z8774 Personal history of (corrected) congenital malformations of heart and circulatory system: Secondary | ICD-10-CM | POA: Diagnosis not present

## 2018-03-24 NOTE — Progress Notes (Signed)
Cardiac Individual Treatment Plan  Patient Details  Name: Kristy Lewis MRN: [id number] Date of Birth: 1960/06/13 Referring Provider:     CARDIAC REHAB PHASE II ORIENTATION from 02/10/2018 in [place]  Referring Provider  [name]      Initial Encounter Date:    CARDIAC REHAB PHASE II ORIENTATION from 02/10/2018 in [place]  Date  02/10/18      Visit Diagnosis: ASD, spontaneous closure  Patient's Home Medications on Admission:  Current Outpatient Medications:  .  acetaminophen (TYLENOL) 500 MG tablet, Take 2 tablets (1,000 mg total) by mouth every 6 (six) hours as needed., Disp: 30 tablet, Rfl: 0 .  bisacodyl (DULCOLAX) 5 MG EC tablet, Take 5 mg by mouth daily as needed for moderate constipation., Disp: , Rfl:  .  diazepam (VALIUM) 2 MG tablet, Take 2 mg by mouth 2 (two) times daily as needed for anxiety. , Disp: , Rfl:  .  FLUoxetine (PROZAC) 40 MG capsule, Take 40 mg by mouth daily. , Disp: , Rfl:  .  ibuprofen (ADVIL) 200 MG tablet, Take 200mg  by mouth twice a day x 7 days, then followed by 200mg  daily thereafter. (Patient taking differently: daily as needed for headache or mild pain. ), Disp: , Rfl: 0 .  Multiple Vitamin (MULTIVITAMIN WITH MINERALS) TABS tablet, Take 1 tablet by mouth daily., Disp: , Rfl:  .  nitroGLYCERIN (NITROSTAT) 0.4 MG SL tablet, Place 1 tablet (0.4 mg total) under the tongue every 5 (five) minutes as needed for chest pain., Disp: 30 tablet, Rfl: 0 .  omeprazole (PRILOSEC) 20 MG capsule, Take 1 capsule (20 mg total) by mouth daily., Disp: 90 capsule, Rfl: 3 .  SUMAtriptan (IMITREX) 50 MG tablet, Take 50 mg by mouth every 2 (two) hours as needed for migraine. May repeat in 2 hours if headache persists or recurs., Disp: , Rfl:   Past Medical History: Past Medical History:  Diagnosis Date  . Anginal pain ([place])    Comes and goes, pt states its normal to have every day   . ASD (atrial septal defect), sinus  venosus defect   . Bipolar disorder ([place])    pt denies  . Cardiac murmur   . Carpal tunnel syndrome   . Depressive disorder   . Dysphagia   . Dyspnea    Upon exertion  . Esophageal reflux   . Genital herpes    at age 85  . Hypercholesteremia   . Migraines   . 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 Urinary incontinence     Tobacco Use: Social History   Tobacco Use  Smoking Status Former Smoker  . Packs/day: 0.50  . Last attempt to quit: 08/05/2017  . Years since quitting: 0.6  Smokeless Tobacco Never Used  Tobacco Comment   quit 2 yrs ago after smoking for over 20 yrs.    Labs: Recent Review Flowsheet Data    Labs for ITP Cardiac and Pulmonary Rehab Latest Ref Rng & Units 10/21/2017 10/21/2017 10/21/2017 10/21/2017 10/22/2017   Hemoglobin A1c 4.8 - 5.6 % - - - - -   PHART 7.350 - 7.450 7.244(L) 7.350 7.339(L) - -   PCO2ART 32.0 - 48.0 mmHg 59.5(H) 49.5(H) 45.3 - -   HCO3 20.0 - 28.0 mmol/L 25.7 27.4 24.6 - -   TCO2 22 - 32 mmol/L 27 29 26 25 27    ACIDBASEDEF 0.0 - 2.0 mmol/L 3.0(H) - 2.0 - -  O2SAT % 96.0 98.0 98.0 - -      Capillary Blood Glucose: Lab Results  Component Value Date   GLUCAP 95 10/23/2017   GLUCAP 92 10/23/2017   GLUCAP 103 (H) 10/22/2017   GLUCAP 99 10/22/2017   GLUCAP 94 10/22/2017     Exercise Target Goals: Exercise Program Goal: Individual exercise prescription set using results from initial 6 min walk test and THRR while considering  patient's activity barriers and safety.   Exercise Prescription Goal: Starting with aerobic activity 30 plus minutes a day, 3 days per week for initial exercise prescription. Provide home exercise prescription and guidelines that participant acknowledges understanding prior to discharge.  Activity Barriers & Risk Stratification: Activity Barriers & Cardiac Risk Stratification - 02/10/18 0918      Activity Barriers & Cardiac Risk  Stratification   Activity Barriers  None    Cardiac Risk Stratification  High       6 Minute Walk: 6 Minute Walk    Row Name 02/10/18 0917         6 Minute Walk   Phase  Initial     Distance  1400 feet     Walk Time  6 minutes     # of Rest Breaks  0     MPH  2.65     METS  3.03     RPE  10     Perceived Dyspnea   11     VO2 Peak  12.85     Symptoms  No     Resting HR  86 bpm     Resting BP  108/70     Resting Oxygen Saturation   97 %     Exercise Oxygen Saturation  during 6 min walk  97 %     Max Ex. HR  101 bpm     Max Ex. BP  124/72     2 Minute Post BP  110/68        Oxygen Initial Assessment:   Oxygen Re-Evaluation:   Oxygen Discharge (Final Oxygen Re-Evaluation):   Initial Exercise Prescription: Initial Exercise Prescription - 02/10/18 0900      Date of Initial Exercise RX and Referring Provider   Date  02/10/18    Referring Provider  [name]    Expected Discharge Date  05/12/18      Treadmill   MPH  1.8    Grade  0    Minutes  17    METs  2.37      Recumbant Elliptical   Level  1    RPM  42    Watts  57    Minutes  22    METs  3.1      Prescription Details   Frequency (times per week)  3    Duration  Progress to 30 minutes of continuous aerobic without signs/symptoms of physical distress      Intensity   THRR 40-80% of Max Heartrate  107-123-138    Ratings of Perceived Exertion  11-13    Perceived Dyspnea  0-4      Progression   Progression  Continue to progress workloads to maintain intensity without signs/symptoms of physical distress.      Resistance Training   Training Prescription  Yes    Weight  1    Reps  10-15       Perform Capillary Blood Glucose checks as needed.  Exercise Prescription Changes:  Exercise Prescription Changes    Row Name  02/16/18 0900 03/05/18 1400           Response to Exercise   Blood Pressure (Admit)  110/70  102/62      Blood Pressure (Exercise)  138/60  128/62      Blood Pressure (Exit)   104/64  100/60      Heart Rate (Admit)  77 bpm  91 bpm      Heart Rate (Exercise)  98 bpm  129 bpm      Heart Rate (Exit)  86 bpm  99 bpm      Rating of Perceived Exertion (Exercise)  11  12      Comments  First day of exercise   first two weeks of exercise       Duration  Continue with 30 min of aerobic exercise without signs/symptoms of physical distress.  Continue with 30 min of aerobic exercise without signs/symptoms of physical distress.      Intensity  THRR New 107-123-138  THRR unchanged        Progression   Progression  Continue to progress workloads to maintain intensity without signs/symptoms of physical distress.  Continue to progress workloads to maintain intensity without signs/symptoms of physical distress.      Average METs  2.88  3.8        Resistance Training   Training Prescription  Yes  Yes      Weight  1  1      Reps  10-15  10-15        Treadmill   MPH  1.8  2      Grade  0  0.5      Minutes  17  22      METs  2.37  3        Recumbant Elliptical   Level  1  1      RPM  51  63      Watts  64  80      Minutes  22  17      METs  3.4  4.6        Home Exercise Plan   Plans to continue exercise at  Home (comment)  Home (comment)      Frequency  Add 2 additional days to program exercise sessions.  Add 2 additional days to program exercise sessions.      Initial Home Exercises Provided  02/10/18  02/10/18         Exercise Comments:  Exercise Comments    Row Name 02/22/18 1459 03/05/18 1413 03/16/18 0814 03/22/18 1545     Exercise Comments  Pt. has attended 5 sessions so far. She has shown to be tolerating the exercise well, even better than she thought. She has been able to make it the entire 17-22 minutes on the equipment. Other than her incisional pain she seems to be stringer than she thinks. Will continue to monitor and progress as needed.   Pt. has attended 7 sessions so far. She is currently out with the flu. We will re-evaluate and adjust her workloads  when she returns.   Pt. has not returned since 03/01/2018. While out for the flu she also got pneumonia and has been trying to recover. Upon return we will adjust workloads to tolerance level.   Pt. has returned to [facility]. She is tolerating the exercise well since getting back in to the routine. We will continue to monitor and progress her workloads as she continues to increase her  strength and stamina.        Exercise Goals and Review:  Exercise Goals    Row Name 02/10/18 0919             Exercise Goals   Increase Physical Activity  Yes       Intervention  Provide advice, education, support and counseling about physical activity/exercise needs.;Develop an individualized exercise prescription for aerobic and resistive training based on initial evaluation findings, risk stratification, comorbidities and participant's personal goals.       Expected Outcomes  Short Term: Attend rehab on a regular basis to increase amount of physical activity.       Increase Strength and Stamina  Yes       Intervention  Provide advice, education, support and counseling about physical activity/exercise needs.;Develop an individualized exercise prescription for aerobic and resistive training based on initial evaluation findings, risk stratification, comorbidities and participant's personal goals.       Expected Outcomes  Short Term: Increase workloads from initial exercise prescription for resistance, speed, and METs.       Able to understand and use rate of perceived exertion (RPE) scale  Yes       Intervention  Provide education and explanation on how to use RPE scale       Expected Outcomes  Short Term: Able to use RPE daily in rehab to express subjective intensity level;Long Term:  Able to use RPE to guide intensity level when exercising independently       Able to understand and use Dyspnea scale  Yes       Intervention  Provide education and explanation on how to use Dyspnea scale       Expected Outcomes  Short  Term: Able to use Dyspnea scale daily in rehab to express subjective sense of shortness of breath during exertion;Long Term: Able to use Dyspnea scale to guide intensity level when exercising independently       Knowledge and understanding of Target Heart Rate Range (THRR)  Yes       Intervention  Provide education and explanation of THRR including how the numbers were predicted and where they are located for reference       Expected Outcomes  Short Term: Able to state/look up THRR       Able to check pulse independently  Yes       Intervention  Provide education and demonstration on how to check pulse in carotid and radial arteries.;Review the importance of being able to check your own pulse for safety during independent exercise       Expected Outcomes  Short Term: Able to explain why pulse checking is important during independent exercise;Long Term: Able to check pulse independently and accurately       Understanding of Exercise Prescription  Yes       Intervention  Provide education, explanation, and written materials on patient's individual exercise prescription       Expected Outcomes  Short Term: Able to explain program exercise prescription;Long Term: Able to explain home exercise prescription to exercise independently          Exercise Goals Re-Evaluation : Exercise Goals Re-Evaluation    Row Name 02/22/18 1456 03/22/18 1520           Exercise Goal Re-Evaluation   Exercise Goals Review  Increase Physical Activity;Increase Strength and Stamina;Able to understand and use rate of perceived exertion (RPE) scale;Knowledge and understanding of Target Heart Rate Range (THRR);Able to check pulse independently;Understanding of  Exercise Prescription  Increase Physical Activity;Increase Strength and Stamina;Able to understand and use rate of perceived exertion (RPE) scale;Knowledge and understanding of Target Heart Rate Range (THRR);Able to check pulse independently;Understanding of Exercise  Prescription      Comments  Pt. is new to the program, she has attended 5 sessions so far. She is doing well with the exercise and has been able to last the alotted times on the machines, which she was worried about in the beginning. We will continue to monitor and progess as tolerated.   Pt. has completed 10 sessions so far after returning from having the flu and pnemonia. She has tolerated exercise well since returning to her original workloads.       Expected Outcomes  increase strength.   increase strength.           Discharge Exercise Prescription (Final Exercise Prescription Changes): Exercise Prescription Changes - 03/05/18 1400      Response to Exercise   Blood Pressure (Admit)  102/62    Blood Pressure (Exercise)  128/62    Blood Pressure (Exit)  100/60    Heart Rate (Admit)  91 bpm    Heart Rate (Exercise)  129 bpm    Heart Rate (Exit)  99 bpm    Rating of Perceived Exertion (Exercise)  12    Comments  first two weeks of exercise     Duration  Continue with 30 min of aerobic exercise without signs/symptoms of physical distress.    Intensity  THRR unchanged      Progression   Progression  Continue to progress workloads to maintain intensity without signs/symptoms of physical distress.    Average METs  3.8      Resistance Training   Training Prescription  Yes    Weight  1    Reps  10-15      Treadmill   MPH  2    Grade  0.5    Minutes  22    METs  3      Recumbant Elliptical   Level  1    RPM  63    Watts  80    Minutes  17    METs  4.6      Home Exercise Plan   Plans to continue exercise at  Home (comment)    Frequency  Add 2 additional days to program exercise sessions.    Initial Home Exercises Provided  02/10/18       Nutrition:  Target Goals: Understanding of nutrition guidelines, daily intake of sodium 1500mg , cholesterol 200mg , calories 30% from fat and 7% or less from saturated fats, daily to have 5 or more servings of fruits and  vegetables.  Biometrics: Pre Biometrics - 02/10/18 0920      Pre Biometrics   Lewis  5\' 6"  (1.676 m)    Waist Circumference  37 inches    Hip Circumference  40 inches    Waist to Hip Ratio  0.92 %    Triceps Skinfold  23 mm    % Body Fat  38.1 %    Grip Strength  8.7 kg    Single Leg Stand  21 seconds        Nutrition Therapy Plan and Nutrition Goals: Nutrition Therapy & Goals - 03/24/18 1440      Nutrition Therapy   RD appointment deferred  Yes      Personal Nutrition Goals   Comments  Patient was reminded about the RD meeting tomorrow. Patient  states she has not changed her diet since last 30 day review. Will continue to monitor for progress.       Intervention Plan   Intervention  Nutrition handout(s) given to patient.       Nutrition Assessments: Nutrition Assessments - 02/10/18 1137      MEDFICTS Scores   Pre Score  42       Nutrition Goals Re-Evaluation:   Nutrition Goals Discharge (Final Nutrition Goals Re-Evaluation):   Psychosocial: Target Goals: Acknowledge presence or absence of significant depression and/or stress, maximize coping skills, provide positive support system. Participant is able to verbalize types and ability to use techniques and skills needed for reducing stress and depression.  Initial Review & Psychosocial Screening: Initial Psych Review & Screening - 02/10/18 1134      Initial Review   Current issues with  History of Depression      Family Dynamics   Good Support System?  Yes      Barriers   Psychosocial barriers to participate in program  There are no identifiable barriers or psychosocial needs.      Screening Interventions   Interventions  Encouraged to exercise    Expected Outcomes  Short Term goal: Identification and review with participant of any Quality of Life or Depression concerns found by scoring the questionnaire.;Long Term goal: The participant improves quality of Life and PHQ9 Scores as seen by post scores  and/or verbalization of changes       Quality of Life Scores: Quality of Life - 02/10/18 0921      Quality of Life   Select  Quality of Life      Quality of Life Scores   Health/Function Pre  11.67 %    Socioeconomic Pre  16.31 %    Psych/Spiritual Pre  20.36 %    Family Pre  20.5 %    GLOBAL Pre  15.73 %      Scores of 19 and below usually indicate a poorer quality of life in these areas.  A difference of  2-3 points is a clinically meaningful difference.  A difference of 2-3 points in the total score of the Quality of Life Index has been associated with significant improvement in overall quality of life, self-image, physical symptoms, and general health in studies assessing change in quality of life.  PHQ-9: Recent Review Flowsheet Data    Depression screen [facility] 2/9 02/10/2018   Decreased Interest 0   Down, Depressed, Hopeless 1   PHQ - 2 Score 1   Altered sleeping 0   Tired, decreased energy 1   Change in appetite 1   Feeling bad or failure about yourself  0   Trouble concentrating 1   Moving slowly or fidgety/restless 0   Suicidal thoughts 0   PHQ-9 Score 4   Difficult doing work/chores Not difficult at all     Interpretation of Total Score  Total Score Depression Severity:  1-4 = Minimal depression, 5-9 = Mild depression, 10-14 = Moderate depression, 15-19 = Moderately severe depression, 20-27 = Severe depression   Psychosocial Evaluation and Intervention: Psychosocial Evaluation - 02/10/18 1135      Psychosocial Evaluation & Interventions   Interventions  Encouraged to exercise with the program and follow exercise prescription    Continue Psychosocial Services   No Follow up required       Psychosocial Re-Evaluation: Psychosocial Re-Evaluation    [place] Name 02/25/18 [id number] 03/24/18 1443  Psychosocial Re-Evaluation   Current issues with  History of Depression  History of Depression      Comments  Patient's initial QOL score was 15.73 and her PHQ-9  score was 2 with no psychosocial barriers identified. She does have a h/o depression and is currently on Fluoxetine 40 mg daily and diazepam 2 mg prn. Will continue to monitor.   Patient's initial QOL score was 15.73 and her PHQ-9 score was 2 with no psychosocial barriers identified. She does have a h/o depression and is currently on Fluoxetine 40 mg daily and diazepam 2 mg prn. Will continue to monitor.       Expected Outcomes  Patient will have improved QOL scores and no psychosocial issues identified at discharge.   Patient will have improved QOL scores and no psychosocial issues identified at discharge.       Interventions  Stress management education;Relaxation education;Encouraged to attend Cardiac Rehabilitation for the exercise  Stress management education;Relaxation education;Encouraged to attend Cardiac Rehabilitation for the exercise      Continue Psychosocial Services   No Follow up required  No Follow up required         Psychosocial Discharge (Final Psychosocial Re-Evaluation): Psychosocial Re-Evaluation - 03/24/18 1443      Psychosocial Re-Evaluation   Current issues with  History of Depression    Comments  Patient's initial QOL score was 15.73 and her PHQ-9 score was 2 with no psychosocial barriers identified. She does have a h/o depression and is currently on Fluoxetine 40 mg daily and diazepam 2 mg prn. Will continue to monitor.     Expected Outcomes  Patient will have improved QOL scores and no psychosocial issues identified at discharge.     Interventions  Stress management education;Relaxation education;Encouraged to attend Cardiac Rehabilitation for the exercise    Continue Psychosocial Services   No Follow up required       Vocational Rehabilitation: Provide vocational rehab assistance to qualifying candidates.   Vocational Rehab Evaluation & Intervention: Vocational Rehab - 02/10/18 1140      Initial Vocational Rehab Evaluation & Intervention   Assessment shows need  for Vocational Rehabilitation  No       Education: Education Goals: Education classes will be provided on a weekly basis, covering required topics. Participant will state understanding/return demonstration of topics presented.  Learning Barriers/Preferences: Learning Barriers/Preferences - 02/10/18 1139      Learning Barriers/Preferences   Learning Barriers  None    Learning Preferences  Individual Instruction;Group Instruction;Written Material;Pictoral;Skilled Demonstration       Education Topics: Hypertension, Hypertension Reduction -Define heart disease and high blood pressure. Discus how high blood pressure affects the body and ways to reduce high blood pressure.   Exercise and Your Heart -Discuss why it is important to exercise, the FITT principles of exercise, normal and abnormal responses to exercise, and how to exercise safely.   Angina -Discuss definition of angina, causes of angina, treatment of angina, and how to decrease risk of having angina.   Cardiac Medications -Review what the following cardiac medications are used for, how they affect the body, and side effects that may occur when taking the medications.  Medications include Aspirin, Beta blockers, calcium channel blockers, ACE Inhibitors, angiotensin receptor blockers, diuretics, digoxin, and antihyperlipidemics.   CARDIAC REHAB PHASE II EXERCISE from 03/24/2018 in [place]  Date  03/17/18  Educator  Wynetta Emery  Instruction Review Code  2- Demonstrated Understanding      Congestive Heart Failure -Discuss the definition  of CHF, how to live with CHF, the signs and symptoms of CHF, and how keep track of weight and sodium intake.   CARDIAC REHAB PHASE II EXERCISE from 03/24/2018 in [place]  Date  03/24/18  Educator  Wynetta Emery  Instruction Review Code  2- Demonstrated Understanding      Heart Disease and Intimacy -Discus the effect sexual activity has on the  heart, how changes occur during intimacy as we age, and safety during sexual activity.   Smoking Cessation / COPD -Discuss different methods to quit smoking, the health benefits of quitting smoking, and the definition of COPD.   Nutrition I: Fats -Discuss the types of cholesterol, what cholesterol does to the heart, and how cholesterol levels can be controlled.   Nutrition II: Labels -Discuss the different components of food labels and how to read food label   Heart Parts/Heart Disease and PAD -Discuss the anatomy of the heart, the pathway of blood circulation through the heart, and these are affected by heart disease.   Stress I: Signs and Symptoms -Discuss the causes of stress, how stress may lead to anxiety and depression, and ways to limit stress.   Stress II: Relaxation -Discuss different types of relaxation techniques to limit stress.   Warning Signs of Stroke / TIA -Discuss definition of a stroke, what the signs and symptoms are of a stroke, and how to identify when someone is having stroke.   CARDIAC REHAB PHASE II EXERCISE from 03/24/2018 in [place]  Date  02/17/18  Educator  Coad  Instruction Review Code  2- Demonstrated Understanding      Knowledge Questionnaire Score: Knowledge Questionnaire Score - 02/10/18 1140      Knowledge Questionnaire Score   Pre Score  22/24       Core Components/Risk Factors/Patient Goals at Admission: Personal Goals and Risk Factors at Admission - 02/10/18 1141      Core Components/Risk Factors/Patient Goals on Admission    Weight Management  Yes    Intervention  Weight Management/Obesity: Establish reasonable short term and long term weight goals.    Admit Weight  175 lb 12.8 oz (79.7 kg)    Goal Weight: Short Term  170 lb 12.8 oz (77.5 kg)    Goal Weight: Long Term  165 lb 12.8 oz (75.2 kg)    Expected Outcomes  Short Term: Continue to assess and modify interventions until short term weight is  achieved;Long Term: Adherence to nutrition and physical activity/exercise program aimed toward attainment of established weight goal    Personal Goal Other  Yes    Personal Goal  Be stronger, Look and feel better    Intervention  Attend CR class 3x week and supplement with home exercise 2 x week    Expected Outcomes  Reach personal goals       Core Components/Risk Factors/Patient Goals Review:  Goals and Risk Factor Review    Row Name 02/25/18 0752 03/24/18 1441           Core Components/Risk Factors/Patient Goals Review   Personal Goals Review  Weight Management/Obesity Be stronger; look and feel better.   Weight Management/Obesity Be stronger; look and feel better.      Review  Patient has completed 5 sessions maintaining her weight since her initial visit. She is new to the program. Will continue to monitor for progress.   Patient has completed 11 sessions gaining 1 lb since last 30 day review. She was out for 2 weeks  with the flu and pneumonia. She states she as not come enough consistently to see any progress yet. Will continue to monitor for progress.       Expected Outcomes  Patient will continue to attend sessions and complete the program.   Patient will continue to attend sessions and complete the program.          Core Components/Risk Factors/Patient Goals at Discharge (Final Review):  Goals and Risk Factor Review - 03/24/18 1441      Core Components/Risk Factors/Patient Goals Review   Personal Goals Review  Weight Management/Obesity   Be stronger; look and feel better.   Review  Patient has completed 11 sessions gaining 1 lb since last 30 day review. She was out for 2 weeks with the flu and pneumonia. She states she as not come enough consistently to see any progress yet. Will continue to monitor for progress.     Expected Outcomes  Patient will continue to attend sessions and complete the program.        ITP Comments:   Comments: ITP REVIEW Patient doing well in the  program. Will continue to monitor for progress.

## 2018-03-24 NOTE — Progress Notes (Signed)
Daily Session Note  Patient Details  Name: Kristy Lewis MRN: [id number] Date of Birth: 03/06/1960 Referring Provider:     CARDIAC REHAB PHASE II ORIENTATION from 02/10/2018 in [facility]  Referring Provider  Owen      Encounter Date: 03/24/2018  Check In: Session Check In - 03/24/18 0930      Check-In   Supervising physician immediately available to respond to emergencies  See telemetry face sheet for immediately available MD    Location  [facility]    Staff Present  Debra Johnson, RN, BSN;Diane Coad, MS, EP, [facility], Exercise Physiologist;Amanda Ballard, Exercise Physiologist    Medication changes reported      No    Fall or balance concerns reported     No    Warm-up and Cool-down  Performed as group-led instruction    Resistance Training Performed  Yes    VAD Patient?  No    PAD/SET Patient?  No      Pain Assessment   Currently in Pain?  No/denies    Pain Score  0-No pain    Multiple Pain Sites  No       Capillary Blood Glucose: No results found for this or any previous visit (from the past 24 hour(s)).    Social History   Tobacco Use  Smoking Status Former Smoker  . Packs/day: 0.50  . Last attempt to quit: 08/05/2017  . Years since quitting: 0.6  Smokeless Tobacco Never Used  Tobacco Comment   quit 2 yrs ago after smoking for over 20 yrs.    Goals Met:  Proper associated with RPD/PD & O2 Sat Independence with exercise equipment Exercise tolerated well No report of cardiac concerns or symptoms Strength training completed today  Goals Unmet:  Not Applicable  Comments: Pt able to follow exercise prescription today without complaint.  Will continue to monitor for progression. Check out 1030.   Dr. Suresh Koneswaran is Medical Director for [facility]. 

## 2018-03-25 ENCOUNTER — Telehealth: Payer: Self-pay | Admitting: Cardiovascular Disease

## 2018-03-25 ENCOUNTER — Ambulatory Visit: Payer: BLUE CROSS/BLUE SHIELD | Admitting: Cardiovascular Disease

## 2018-03-25 DIAGNOSIS — I209 Angina pectoris, unspecified: Secondary | ICD-10-CM

## 2018-03-25 DIAGNOSIS — R0600 Dyspnea, unspecified: Secondary | ICD-10-CM

## 2018-03-25 NOTE — Telephone Encounter (Signed)
I called and spoke with the patient at length.  She apologized several times and said she is just frustrated about her symptoms which have continued since her surgery.  She has exertional chest pain and dyspnea and thought she would have improved by now.  She does fine with cardiac rehabilitation but experiences symptoms when she begins doing household chores. In order to evaluate for any postsurgical changes, I will obtain coronary CT angiography.  Please have her follow-up with me after we have the study results.

## 2018-03-25 NOTE — Telephone Encounter (Signed)
Patient asking for Dr Bronson Ing to call her personally.  Patient was late for appointment - Dr Bronson Ing asked for her to be rescheduled  Patient declined appointment offered with Dr Bronson Ing March 26, 2018 due to another appointment.  I offered an appointment with Bernerd Pho in [place] April 09, 2018, but she also declined -no reason given.

## 2018-03-25 NOTE — Telephone Encounter (Signed)
Orders placed - pt will also need labs prior to coronary CT - will forward to nurse for f/u

## 2018-03-26 ENCOUNTER — Encounter (HOSPITAL_COMMUNITY): Payer: BLUE CROSS/BLUE SHIELD

## 2018-03-29 ENCOUNTER — Encounter (HOSPITAL_COMMUNITY)
Admission: RE | Admit: 2018-03-29 | Discharge: 2018-03-29 | Disposition: A | Discharge status: Home / Self Care | Payer: BLUE CROSS/BLUE SHIELD | Source: Ambulatory Visit | Attending: Cardiovascular Disease | Admitting: Cardiovascular Disease

## 2018-03-29 ENCOUNTER — Encounter (HOSPITAL_COMMUNITY): Payer: BLUE CROSS/BLUE SHIELD

## 2018-03-29 DIAGNOSIS — Z8774 Personal history of (corrected) congenital malformations of heart and circulatory system: Secondary | ICD-10-CM

## 2018-03-29 NOTE — Progress Notes (Signed)
Daily Session Note  Patient Details  Name: UNDRA TREMBATH MRN: [id number] Date of Birth: 1960/09/16 Referring Provider:     CARDIAC REHAB PHASE II ORIENTATION from 02/10/2018 in [place]  Referring Provider  [name]      Encounter Date: 03/29/2018  Check In: Session Check In - 03/29/18 0930      Check-In   Supervising physician immediately available to respond to emergencies  See telemetry face sheet for immediately available MD    Location  [facility]    Staff Present  Russella Dar, MS, EP, [facility], Exercise Physiologist;Jada Kuhnert Zachery Conch, Exercise Physiologist    Medication changes reported      No    Fall or balance concerns reported     No    Warm-up and Cool-down  Performed as group-led instruction    Resistance Training Performed  Yes    VAD Patient?  No    PAD/SET Patient?  No      Pain Assessment   Currently in Pain?  No/denies    Pain Score  0-No pain    Multiple Pain Sites  No       Capillary Blood Glucose: No results found for this or any previous visit (from the past 24 hour(s)).    Social History   Tobacco Use  Smoking Status Former Smoker  . Packs/day: 0.50  . Last attempt to quit: 08/05/2017  . Years since quitting: 0.6  Smokeless Tobacco Never Used  Tobacco Comment   quit 2 yrs ago after smoking for over 20 yrs.    Goals Met:  Proper associated with RPD/PD & O2 Sat Independence with exercise equipment Exercise tolerated well No report of cardiac concerns or symptoms Strength training completed today  Goals Unmet:  Not Applicable  Comments: Pt able to follow exercise prescription today without complaint.  Will continue to monitor for progression. Check out 1030.   Dr. Kate Sable is Medical Director for [facility].

## 2018-03-31 ENCOUNTER — Encounter (HOSPITAL_COMMUNITY)
Admission: RE | Admit: 2018-03-31 | Discharge: 2018-03-31 | Disposition: A | Discharge status: Home / Self Care | Payer: BLUE CROSS/BLUE SHIELD | Source: Ambulatory Visit | Attending: Cardiovascular Disease | Admitting: Cardiovascular Disease

## 2018-03-31 ENCOUNTER — Other Ambulatory Visit: Payer: Self-pay

## 2018-03-31 ENCOUNTER — Encounter (HOSPITAL_COMMUNITY): Payer: BLUE CROSS/BLUE SHIELD

## 2018-03-31 DIAGNOSIS — Z8774 Personal history of (corrected) congenital malformations of heart and circulatory system: Secondary | ICD-10-CM

## 2018-03-31 NOTE — Progress Notes (Signed)
Daily Session Note  Patient Details  Name: Kristy Lewis MRN: [id number] Date of Birth: 04-26-1960 Referring Provider:     CARDIAC REHAB PHASE II ORIENTATION from 02/10/2018 in [place]  Referring Provider  [name]      Encounter Date: 03/31/2018  Check In: Session Check In - 03/31/18 0930      Check-In   Supervising physician immediately available to respond to emergencies  See telemetry face sheet for immediately available MD    Location  [facility]    Staff Present  Benay Pike, Exercise Physiologist;Debra Wynetta Emery, RN, BSN;Other    Medication changes reported      No    Fall or balance concerns reported     No    Warm-up and Cool-down  Performed as group-led instruction    Resistance Training Performed  Yes    VAD Patient?  No    PAD/SET Patient?  No      Pain Assessment   Currently in Pain?  No/denies    Pain Score  0-No pain    Multiple Pain Sites  No       Capillary Blood Glucose: No results found for this or any previous visit (from the past 24 hour(s)).    Social History   Tobacco Use  Smoking Status Former Smoker  . Packs/day: 0.50  . Last attempt to quit: 08/05/2017  . Years since quitting: 0.6  Smokeless Tobacco Never Used  Tobacco Comment   quit 2 yrs ago after smoking for over 20 yrs.    Goals Met:  Proper associated with RPD/PD & O2 Sat Independence with exercise equipment Exercise tolerated well No report of cardiac concerns or symptoms Strength training completed today  Goals Unmet:  Not Applicable  Comments: Pt able to follow exercise prescription today without complaint.  Will continue to monitor for progression. Check 1030.   Dr. Kate Sable is Medical Director for [facility].

## 2018-04-02 ENCOUNTER — Encounter (HOSPITAL_COMMUNITY): Payer: BLUE CROSS/BLUE SHIELD

## 2018-04-02 ENCOUNTER — Other Ambulatory Visit: Payer: Self-pay

## 2018-04-02 ENCOUNTER — Encounter (HOSPITAL_COMMUNITY)
Admission: RE | Admit: 2018-04-02 | Discharge: 2018-04-02 | Disposition: A | Discharge status: Home / Self Care | Payer: BLUE CROSS/BLUE SHIELD | Source: Ambulatory Visit | Attending: Cardiovascular Disease | Admitting: Cardiovascular Disease

## 2018-04-02 DIAGNOSIS — Z8774 Personal history of (corrected) congenital malformations of heart and circulatory system: Secondary | ICD-10-CM | POA: Diagnosis not present

## 2018-04-02 NOTE — Progress Notes (Signed)
Daily Session Note  Patient Details  Name: Kristy Lewis MRN: [id number] Date of Birth: 02/22/1960 Referring Provider:     CARDIAC REHAB PHASE II ORIENTATION from 02/10/2018 in [place]  Referring Provider  [name]      Encounter Date: 04/02/2018  Check In: Session Check In - 04/02/18 0930      Check-In   Supervising physician immediately available to respond to emergencies  See telemetry face sheet for immediately available MD    Location  [facility]    Staff Present  Benay Pike, Exercise Physiologist;Debra Wynetta Emery, RN, BSN;Diane Coad, MS, EP, [facility], Exercise Physiologist    Medication changes reported      No    Fall or balance concerns reported     No    Warm-up and Cool-down  Performed as group-led instruction    Resistance Training Performed  Yes    VAD Patient?  No    PAD/SET Patient?  No      Pain Assessment   Currently in Pain?  No/denies    Pain Score  0-No pain    Multiple Pain Sites  No       Capillary Blood Glucose: No results found for this or any previous visit (from the past 24 hour(s)).    Social History   Tobacco Use  Smoking Status Former Smoker  . Packs/day: 0.50  . Last attempt to quit: 08/05/2017  . Years since quitting: 0.6  Smokeless Tobacco Never Used  Tobacco Comment   quit 2 yrs ago after smoking for over 20 yrs.    Goals Met:  Independence with exercise equipment Exercise tolerated well No report of cardiac concerns or symptoms Strength training completed today  Goals Unmet:  Not Applicable  Comments: Pt able to follow exercise prescription today without complaint.  Will continue to monitor for progression. Check out 1030.   Dr. Kate Sable is Medical Director for [facility] Card[facility] and [facility] Re[facility].

## 2018-04-05 ENCOUNTER — Encounter (HOSPITAL_COMMUNITY): Payer: BLUE CROSS/BLUE SHIELD

## 2018-04-07 ENCOUNTER — Encounter (HOSPITAL_COMMUNITY): Payer: BLUE CROSS/BLUE SHIELD

## 2018-04-09 ENCOUNTER — Encounter (HOSPITAL_COMMUNITY): Payer: BLUE CROSS/BLUE SHIELD

## 2018-04-09 NOTE — Progress Notes (Addendum)
Cardiac Individual Treatment Plan  Patient Details  Name: Kristy Lewis MRN: [id number] Date of Birth: January 09, 1961 Referring Provider:     CARDIAC REHAB PHASE II ORIENTATION from 02/10/2018 in [place] [facility]  Referring Provider  [name]      Initial Encounter Date:    CARDIAC REHAB PHASE II ORIENTATION from 02/10/2018 in [place]  Date  02/10/18      Visit Diagnosis: ASD, spontaneous closure  Patient's Home Medications on Admission:  Current Outpatient Medications:  .  acetaminophen (TYLENOL) 500 MG tablet, Take 2 tablets (1,000 mg total) by mouth every 6 (six) hours as needed., Disp: 30 tablet, Rfl: 0 .  bisacodyl (DULCOLAX) 5 MG EC tablet, Take 5 mg by mouth daily as needed for moderate constipation., Disp: , Rfl:  .  diazepam (VALIUM) 2 MG tablet, Take 2 mg by mouth 2 (two) times daily as needed for anxiety. , Disp: , Rfl:  .  FLUoxetine (PROZAC) 40 MG capsule, Take 40 mg by mouth daily. , Disp: , Rfl:  .  ibuprofen (ADVIL) 200 MG tablet, Take 200mg  by mouth twice a day x 7 days, then followed by 200mg  daily thereafter. (Patient taking differently: daily as needed for headache or mild pain. ), Disp: , Rfl: 0 .  Multiple Vitamin (MULTIVITAMIN WITH MINERALS) TABS tablet, Take 1 tablet by mouth daily., Disp: , Rfl:  .  nitroGLYCERIN (NITROSTAT) 0.4 MG SL tablet, Place 1 tablet (0.4 mg total) under the tongue every 5 (five) minutes as needed for chest pain., Disp: 30 tablet, Rfl: 0 .  omeprazole (PRILOSEC) 20 MG capsule, Take 1 capsule (20 mg total) by mouth daily., Disp: 90 capsule, Rfl: 3 .  SUMAtriptan (IMITREX) 50 MG tablet, Take 50 mg by mouth every 2 (two) hours as needed for migraine. May repeat in 2 hours if headache persists or recurs., Disp: , Rfl:   Past Medical History: Past Medical History:  Diagnosis Date  . Anginal pain ([place])    Comes and goes, pt states its normal to have every day   . ASD (atrial septal defect), sinus  venosus defect   . Bipolar disorder ([place])    pt denies  . Cardiac murmur   . Carpal tunnel syndrome   . Depressive disorder   . Dysphagia   . Dyspnea    Upon exertion  . Esophageal reflux   . Genital herpes    at age 41  . Hypercholesteremia   . Migraines   . 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 Urinary incontinence     Tobacco Use: Social History   Tobacco Use  Smoking Status Former Smoker  . Packs/day: 0.50  . Last attempt to quit: 08/05/2017  . Years since quitting: 0.6  Smokeless Tobacco Never Used  Tobacco Comment   quit 2 yrs ago after smoking for over 20 yrs.    Labs: Recent Review Flowsheet Data    Labs for ITP Cardiac and Pulmonary Rehab Latest Ref Rng & Units 10/21/2017 10/21/2017 10/21/2017 10/21/2017 10/22/2017   Hemoglobin A1c 4.8 - 5.6 % - - - - -   PHART 7.350 - 7.450 7.244(L) 7.350 7.339(L) - -   PCO2ART 32.0 - 48.0 mmHg 59.5(H) 49.5(H) 45.3 - -   HCO3 20.0 - 28.0 mmol/L 25.7 27.4 24.6 - -   TCO2 22 - 32 mmol/L 27 29 26 25 27    ACIDBASEDEF 0.0 - 2.0 mmol/L 3.0(H) - 2.0 - -  O2SAT % 96.0 98.0 98.0 - -      Capillary Blood Glucose: Lab Results  Component Value Date   GLUCAP 95 10/23/2017   GLUCAP 92 10/23/2017   GLUCAP 103 (H) 10/22/2017   GLUCAP 99 10/22/2017   GLUCAP 94 10/22/2017     Exercise Target Goals: Exercise Program Goal: Individual exercise prescription set using results from initial 6 min walk test and THRR while considering  patient's activity barriers and safety.   Exercise Prescription Goal: Starting with aerobic activity 30 plus minutes a day, 3 days per week for initial exercise prescription. Provide home exercise prescription and guidelines that participant acknowledges understanding prior to discharge.  Activity Barriers & Risk Stratification: Activity Barriers & Cardiac Risk Stratification - 02/10/18 0918      Activity Barriers & Cardiac Risk  Stratification   Activity Barriers  None    Cardiac Risk Stratification  High       6 Minute Walk: 6 Minute Walk    Row Name 02/10/18 0917         6 Minute Walk   Phase  Initial     Distance  1400 feet     Walk Time  6 minutes     # of Rest Breaks  0     MPH  2.65     METS  3.03     RPE  10     Perceived Dyspnea   11     VO2 Peak  12.85     Symptoms  No     Resting HR  86 bpm     Resting BP  108/70     Resting Oxygen Saturation   97 %     Exercise Oxygen Saturation  during 6 min walk  97 %     Max Ex. HR  101 bpm     Max Ex. BP  124/72     2 Minute Post BP  110/68        Oxygen Initial Assessment:   Oxygen Re-Evaluation:   Oxygen Discharge (Final Oxygen Re-Evaluation):   Initial Exercise Prescription: Initial Exercise Prescription - 02/10/18 0900      Date of Initial Exercise RX and Referring Provider   Date  02/10/18    Referring Provider  [name]    Expected Discharge Date  05/12/18      Treadmill   MPH  1.8    Grade  0    Minutes  17    METs  2.37      Recumbant Elliptical   Level  1    RPM  42    Watts  57    Minutes  22    METs  3.1      Prescription Details   Frequency (times per week)  3    Duration  Progress to 30 minutes of continuous aerobic without signs/symptoms of physical distress      Intensity   THRR 40-80% of Max Heartrate  [id number]    Ratings of Perceived Exertion  11-13    Perceived Dyspnea  0-4      Progression   Progression  Continue to progress workloads to maintain intensity without signs/symptoms of physical distress.      Resistance Training   Training Prescription  Yes    Weight  1    Reps  10-15       Perform Capillary Blood Glucose checks as needed.  Exercise Prescription Changes:  Exercise Prescription Changes    Row Name  02/16/18 0900 03/05/18 1400 03/29/18 1600 04/12/18 0900       Response to Exercise   Blood Pressure (Admit)  110/70  102/62  102/62  122/64    Blood Pressure (Exercise)  138/60   128/62  132/68  142/64    Blood Pressure (Exit)  104/64  100/60  102/62  106/70    Heart Rate (Admit)  77 bpm  91 bpm  73 bpm  81 bpm    Heart Rate (Exercise)  98 bpm  129 bpm  98 bpm  99 bpm    Heart Rate (Exit)  86 bpm  99 bpm  68 bpm  89 bpm    Rating of Perceived Exertion (Exercise)  11  12  12  12     Comments  First day of exercise   first two weeks of exercise   -  -    Duration  Continue with 30 min of aerobic exercise without signs/symptoms of physical distress.  Continue with 30 min of aerobic exercise without signs/symptoms of physical distress.  Continue with 30 min of aerobic exercise without signs/symptoms of physical distress.  Continue with 30 min of aerobic exercise without signs/symptoms of physical distress.    Intensity  THRR New [id number]  THRR unchanged  THRR unchanged  THRR unchanged      Progression   Progression  Continue to progress workloads to maintain intensity without signs/symptoms of physical distress.  Continue to progress workloads to maintain intensity without signs/symptoms of physical distress.  Continue to progress workloads to maintain intensity without signs/symptoms of physical distress.  Continue to progress workloads to maintain intensity without signs/symptoms of physical distress.    Average METs  2.88  3.8  3.48  3.75      Resistance Training   Training Prescription  Yes  Yes  Yes  Yes    Weight  1  1  2  2     Reps  10-15  10-15  10-15  10-15      Treadmill   MPH  1.8  2  2.3  2.5    Grade  0  0.5  1  1     Minutes  17  22  22  22     METs  2.37  3  3.06  3.3      Recumbant Elliptical   Level  1  1  2  2     RPM  51  63  61  61    Watts  64  80  70  75    Minutes  22  17  17  17     METs  3.4  4.6  3.9  4.2      Home Exercise Plan   Plans to continue exercise at  Home (comment)  Home (comment)  Home (comment)  Home (comment)    Frequency  Add 2 additional days to program exercise sessions.  Add 2 additional days to program exercise  sessions.  Add 2 additional days to program exercise sessions.  Add 2 additional days to program exercise sessions.    Initial Home Exercises Provided  02/10/18  02/10/18  02/10/18  02/10/18       Exercise Comments:  Exercise Comments    Row Name 02/22/18 1459 03/05/18 1413 03/16/18 0814 03/22/18 1545 04/12/18 0919   Exercise Comments  Pt. has attended 5 sessions so far. She has shown to be tolerating the exercise well, even better than she thought. She has been able to make  it the entire 17-22 minutes on the equipment. Other than her incisional pain she seems to be stringer than she thinks. Will continue to monitor and progress as needed.   Pt. has attended 7 sessions so far. She is currently out with the flu. We will re-evaluate and adjust her workloads when she returns.   Pt. has not returned since 03/01/2018. While out for the flu she also got pneumonia and has been trying to recover. Upon return we will adjust workloads to tolerance level.   Pt. has returned to CR. She is tolerating the exercise well since getting back in to the routine. We will continue to monitor and progress her workloads as she continues to increase her strength and stamina.   Pt. is doing well in CR. She has attended 14 sessions so far. She is up to 2.5 MPH on the TM with a 1% incline. She is eager to push herself to increase her strength and stamina to take care of her grandchildren.       Exercise Goals and Review:  Exercise Goals    Row Name 02/10/18 0919             Exercise Goals   Increase Physical Activity  Yes       Intervention  Provide advice, education, support and counseling about physical activity/exercise needs.;Develop an individualized exercise prescription for aerobic and resistive training based on initial evaluation findings, risk stratification, comorbidities and participant's personal goals.       Expected Outcomes  Short Term: Attend rehab on a regular basis to increase amount of physical  activity.       Increase Strength and Stamina  Yes       Intervention  Provide advice, education, support and counseling about physical activity/exercise needs.;Develop an individualized exercise prescription for aerobic and resistive training based on initial evaluation findings, risk stratification, comorbidities and participant's personal goals.       Expected Outcomes  Short Term: Increase workloads from initial exercise prescription for resistance, speed, and METs.       Able to understand and use rate of perceived exertion (RPE) scale  Yes       Intervention  Provide education and explanation on how to use RPE scale       Expected Outcomes  Short Term: Able to use RPE daily in rehab to express subjective intensity level;Long Term:  Able to use RPE to guide intensity level when exercising independently       Able to understand and use Dyspnea scale  Yes       Intervention  Provide education and explanation on how to use Dyspnea scale       Expected Outcomes  Short Term: Able to use Dyspnea scale daily in rehab to express subjective sense of shortness of breath during exertion;Long Term: Able to use Dyspnea scale to guide intensity level when exercising independently       Knowledge and understanding of Target Heart Rate Range (THRR)  Yes       Intervention  Provide education and explanation of THRR including how the numbers were predicted and where they are located for reference       Expected Outcomes  Short Term: Able to state/look up THRR       Able to check pulse independently  Yes       Intervention  Provide education and demonstration on how to check pulse in carotid and radial arteries.;Review the importance of being able to check your own  pulse for safety during independent exercise       Expected Outcomes  Short Term: Able to explain why pulse checking is important during independent exercise;Long Term: Able to check pulse independently and accurately       Understanding of Exercise  Prescription  Yes       Intervention  Provide education, explanation, and written materials on patient's individual exercise prescription       Expected Outcomes  Short Term: Able to explain program exercise prescription;Long Term: Able to explain home exercise prescription to exercise independently          Exercise Goals Re-Evaluation : Exercise Goals Re-Evaluation    Row Name 02/22/18 1456 03/22/18 1520 04/12/18 0918         Exercise Goal Re-Evaluation   Exercise Goals Review  Increase Physical Activity;Increase Strength and Stamina;Able to understand and use rate of perceived exertion (RPE) scale;Knowledge and understanding of Target Heart Rate Range (THRR);Able to check pulse independently;Understanding of Exercise Prescription  Increase Physical Activity;Increase Strength and Stamina;Able to understand and use rate of perceived exertion (RPE) scale;Knowledge and understanding of Target Heart Rate Range (THRR);Able to check pulse independently;Understanding of Exercise Prescription  Increase Physical Activity;Increase Strength and Stamina;Able to understand and use rate of perceived exertion (RPE) scale;Knowledge and understanding of Target Heart Rate Range (THRR);Able to check pulse independently;Understanding of Exercise Prescription     Comments  Pt. is new to the program, she has attended 5 sessions so far. She is doing well with the exercise and has been able to last the alotted times on the machines, which she was worried about in the beginning. We will continue to monitor and progess as tolerated.   Pt. has completed 10 sessions so far after returning from having the flu and pnemonia. She has tolerated exercise well since returning to her original workloads.   Pt. continues to do well in the program. She has now attended 14 sessions. She is tolerating the exercise well and will continue to increase workloads as we see fit.      Expected Outcomes  increase strength.   increase strength.    increase strength.          Discharge Exercise Prescription (Final Exercise Prescription Changes): Exercise Prescription Changes - 04/12/18 0900      Response to Exercise   Blood Pressure (Admit)  122/64    Blood Pressure (Exercise)  142/64    Blood Pressure (Exit)  106/70    Heart Rate (Admit)  81 bpm    Heart Rate (Exercise)  99 bpm    Heart Rate (Exit)  89 bpm    Rating of Perceived Exertion (Exercise)  12    Duration  Continue with 30 min of aerobic exercise without signs/symptoms of physical distress.    Intensity  THRR unchanged      Progression   Progression  Continue to progress workloads to maintain intensity without signs/symptoms of physical distress.    Average METs  3.75      Resistance Training   Training Prescription  Yes    Weight  2    Reps  10-15      Treadmill   MPH  2.5    Grade  1    Minutes  22    METs  3.3      Recumbant Elliptical   Level  2    RPM  61    Watts  75    Minutes  17    METs  4.2      Home Exercise Plan   Plans to continue exercise at  Home (comment)    Frequency  Add 2 additional days to program exercise sessions.    Initial Home Exercises Provided  02/10/18       Nutrition:  Target Goals: Understanding of nutrition guidelines, daily intake of sodium 1500mg , cholesterol 200mg , calories 30% from fat and 7% or less from saturated fats, daily to have 5 or more servings of fruits and vegetables.  Biometrics: Pre Biometrics - 02/10/18 0920      Pre Biometrics   Height  5\' 6"  (1.676 m)    Waist Circumference  37 inches    Hip Circumference  40 inches    Waist to Hip Ratio  0.92 %    Triceps Skinfold  23 mm    % Body Fat  38.1 %    Grip Strength  8.7 kg    Single Leg Stand  21 seconds        Nutrition Therapy Plan and Nutrition Goals: Nutrition Therapy & Goals - 04/12/18 1012      Nutrition Therapy   RD appointment deferred  Yes      Personal Nutrition Goals   Comments  Patient was encourage to attend RD  meeting. Patient states she is trying to eat less sugary foods. Will continue to monitor for progress.       Intervention Plan   Intervention  Nutrition handout(s) given to patient.       Nutrition Assessments: Nutrition Assessments - 02/10/18 1137      MEDFICTS Scores   Pre Score  42       Nutrition Goals Re-Evaluation:   Nutrition Goals Discharge (Final Nutrition Goals Re-Evaluation):   Psychosocial: Target Goals: Acknowledge presence or absence of significant depression and/or stress, maximize coping skills, provide positive support system. Participant is able to verbalize types and ability to use techniques and skills needed for reducing stress and depression.  Initial Review & Psychosocial Screening: Initial Psych Review & Screening - 02/10/18 1134      Initial Review   Current issues with  History of Depression      Family Dynamics   Good Support System?  Yes      Barriers   Psychosocial barriers to participate in program  There are no identifiable barriers or psychosocial needs.      Screening Interventions   Interventions  Encouraged to exercise    Expected Outcomes  Short Term goal: Identification and review with participant of any Quality of Life or Depression concerns found by scoring the questionnaire.;Long Term goal: The participant improves quality of Life and PHQ9 Scores as seen by post scores and/or verbalization of changes       Quality of Life Scores: Quality of Life - 02/10/18 0921      Quality of Life   Select  Quality of Life      Quality of Life Scores   Health/Function Pre  11.67 %    Socioeconomic Pre  16.31 %    Psych/Spiritual Pre  20.36 %    Family Pre  20.5 %    GLOBAL Pre  15.73 %      Scores of 19 and below usually indicate a poorer quality of life in these areas.  A difference of  2-3 points is a clinically meaningful difference.  A difference of 2-3 points in the total score of the Quality of Life Index has been associated with  significant improvement in  overall quality of life, self-image, physical symptoms, and general health in studies assessing change in quality of life.  PHQ-9: Recent Review Flowsheet Data    Depression screen [facility] 2/9 02/10/2018   Decreased Interest 0   Down, Depressed, Hopeless 1   PHQ - 2 Score 1   Altered sleeping 0   Tired, decreased energy 1   Change in appetite 1   Feeling bad or failure about yourself  0   Trouble concentrating 1   Moving slowly or fidgety/restless 0   Suicidal thoughts 0   PHQ-9 Score 4   Difficult doing work/chores Not difficult at all     Interpretation of Total Score  Total Score Depression Severity:  1-4 = Minimal depression, 5-9 = Mild depression, 10-14 = Moderate depression, 15-19 = Moderately severe depression, 20-27 = Severe depression   Psychosocial Evaluation and Intervention: Psychosocial Evaluation - 02/10/18 1135      Psychosocial Evaluation & Interventions   Interventions  Encouraged to exercise with the program and follow exercise prescription    Continue Psychosocial Services   No Follow up required       Psychosocial Re-Evaluation: Psychosocial Re-Evaluation    Ethel Name 02/25/18 0758 03/24/18 1443 04/12/18 1015         Psychosocial Re-Evaluation   Current issues with  History of Depression  History of Depression  History of Depression     Comments  Patient's initial QOL score was 15.73 and her PHQ-9 score was 2 with no psychosocial barriers identified. She does have a h/o depression and is currently on Fluoxetine 40 mg daily and diazepam 2 mg prn. Will continue to monitor.   Patient's initial QOL score was 15.73 and her PHQ-9 score was 2 with no psychosocial barriers identified. She does have a h/o depression and is currently on Fluoxetine 40 mg daily and diazepam 2 mg prn. Will continue to monitor.   Patient's initial QOL score was 15.73 and her PHQ-9 score was 2 with no psychosocial barriers identified. She does have a h/o depression  and is currently on Fluoxetine 40 mg daily and diazepam 2 mg prn. Will continue to monitor.      Expected Outcomes  Patient will have improved QOL scores and no psychosocial issues identified at discharge.   Patient will have improved QOL scores and no psychosocial issues identified at discharge.   Patient will have improved QOL scores and no psychosocial issues identified at discharge.      Interventions  Stress management education;Relaxation education;Encouraged to attend Cardiac Rehabilitation for the exercise  Stress management education;Relaxation education;Encouraged to attend Cardiac Rehabilitation for the exercise  Stress management education;Relaxation education;Encouraged to attend Cardiac Rehabilitation for the exercise     Continue Psychosocial Services   No Follow up required  No Follow up required  No Follow up required        Psychosocial Discharge (Final Psychosocial Re-Evaluation): Psychosocial Re-Evaluation - 04/12/18 1015      Psychosocial Re-Evaluation   Current issues with  History of Depression    Comments  Patient's initial QOL score was 15.73 and her PHQ-9 score was 2 with no psychosocial barriers identified. She does have a h/o depression and is currently on Fluoxetine 40 mg daily and diazepam 2 mg prn. Will continue to monitor.     Expected Outcomes  Patient will have improved QOL scores and no psychosocial issues identified at discharge.     Interventions  Stress management education;Relaxation education;Encouraged to attend Cardiac Rehabilitation for the exercise  Continue Psychosocial Services   No Follow up required       Vocational Rehabilitation: Provide vocational rehab assistance to qualifying candidates.   Vocational Rehab Evaluation & Intervention: Vocational Rehab - 02/10/18 1140      Initial Vocational Rehab Evaluation & Intervention   Assessment shows need for Vocational Rehabilitation  No       Education: Education Goals: Education classes  will be provided on a weekly basis, covering required topics. Participant will state understanding/return demonstration of topics presented.  Learning Barriers/Preferences: Learning Barriers/Preferences - 02/10/18 1139      Learning Barriers/Preferences   Learning Barriers  None    Learning Preferences  Individual Instruction;Group Instruction;Written Material;Pictoral;Skilled Demonstration       Education Topics: Hypertension, Hypertension Reduction -Define heart disease and high blood pressure. Discus how high blood pressure affects the body and ways to reduce high blood pressure.   Exercise and Your Heart -Discuss why it is important to exercise, the FITT principles of exercise, normal and abnormal responses to exercise, and how to exercise safely.   Angina -Discuss definition of angina, causes of angina, treatment of angina, and how to decrease risk of having angina.   Cardiac Medications -Review what the following cardiac medications are used for, how they affect the body, and side effects that may occur when taking the medications.  Medications include Aspirin, Beta blockers, calcium channel blockers, ACE Inhibitors, angiotensin receptor blockers, diuretics, digoxin, and antihyperlipidemics.   CARDIAC REHAB PHASE II EXERCISE from 03/31/2018 in [place]  Date  03/17/18  Educator  Wynetta Emery  Instruction Review Code  2- Demonstrated Understanding      Congestive Heart Failure -Discuss the definition of CHF, how to live with CHF, the signs and symptoms of CHF, and how keep track of weight and sodium intake.   CARDIAC REHAB PHASE II EXERCISE from 03/31/2018 in [place]  Date  03/24/18  Educator  Wynetta Emery  Instruction Review Code  2- Demonstrated Understanding      Heart Disease and Intimacy -Discus the effect sexual activity has on the heart, how changes occur during intimacy as we age, and safety during sexual activity.    CARDIAC REHAB PHASE II EXERCISE from 03/31/2018 in [place]  Date  03/31/18  Educator  Wynetta Emery  Instruction Review Code  2- Demonstrated Understanding      Smoking Cessation / COPD -Discuss different methods to quit smoking, the health benefits of quitting smoking, and the definition of COPD.   Nutrition I: Fats -Discuss the types of cholesterol, what cholesterol does to the heart, and how cholesterol levels can be controlled.   Nutrition II: Labels -Discuss the different components of food labels and how to read food label   Heart Parts/Heart Disease and PAD -Discuss the anatomy of the heart, the pathway of blood circulation through the heart, and these are affected by heart disease.   Stress I: Signs and Symptoms -Discuss the causes of stress, how stress may lead to anxiety and depression, and ways to limit stress.   Stress II: Relaxation -Discuss different types of relaxation techniques to limit stress.   Warning Signs of Stroke / TIA -Discuss definition of a stroke, what the signs and symptoms are of a stroke, and how to identify when someone is having stroke.   CARDIAC REHAB PHASE II EXERCISE from 03/31/2018 in [place]  Date  02/17/18  Educator  Coad  Instruction Review Code  2- Demonstrated Understanding  Knowledge Questionnaire Score: Knowledge Questionnaire Score - 02/10/18 1140      Knowledge Questionnaire Score   Pre Score  22/24       Core Components/Risk Factors/Patient Goals at Admission: Personal Goals and Risk Factors at Admission - 02/10/18 1141      Core Components/Risk Factors/Patient Goals on Admission    Weight Management  Yes    Intervention  Weight Management/Obesity: Establish reasonable short term and long term weight goals.    Admit Weight  175 lb 12.8 oz (79.7 kg)    Goal Weight: Short Term  170 lb 12.8 oz (77.5 kg)    Goal Weight: Long Term  165 lb 12.8 oz (75.2 kg)    Expected  Outcomes  Short Term: Continue to assess and modify interventions until short term weight is achieved;Long Term: Adherence to nutrition and physical activity/exercise program aimed toward attainment of established weight goal    Personal Goal Other  Yes    Personal Goal  Be stronger, Look and feel better    Intervention  Attend CR class 3x week and supplement with home exercise 2 x week    Expected Outcomes  Reach personal goals       Core Components/Risk Factors/Patient Goals Review:  Goals and Risk Factor Review    Row Name 02/25/18 0752 03/24/18 1441 04/12/18 1013         Core Components/Risk Factors/Patient Goals Review   Personal Goals Review  Weight Management/Obesity Be stronger; look and feel better.   Weight Management/Obesity Be stronger; look and feel better.  Weight Management/Obesity Be stronger; look and feel better.      Review  Patient has completed 5 sessions maintaining her weight since her initial visit. She is new to the program. Will continue to monitor for progress.   Patient has completed 11 sessions gaining 1 lb since last 30 day review. She was out for 2 weeks with the flu and pneumonia. She states she as not come enough consistently to see any progress yet. Will continue to monitor for progress.   Patient has completed 14 sessions maintaining her weight since last 30 day review. She continues to do well in the program with progression. She states she continues to feel stronger and feels like she looks healthier. She is pleased with her progress in the program. Outpatient cardiac services have been suspended due to the COVID-19 restrictions.  Will continue to monitor.      Expected Outcomes  Patient will continue to attend sessions and complete the program.   Patient will continue to attend sessions and complete the program.   Patient will continue to attend sessions and complete the program.         Core Components/Risk Factors/Patient Goals at Discharge (Final Review):   Goals and Risk Factor Review - 04/12/18 1013      Core Components/Risk Factors/Patient Goals Review   Personal Goals Review  Weight Management/Obesity   Be stronger; look and feel better.    Review  Patient has completed 14 sessions maintaining her weight since last 30 day review. She continues to do well in the program with progression. She states she continues to feel stronger and feels like she looks healthier. She is pleased with her progress in the program. Outpatient cardiac services have been suspended due to the COVID-19 restrictions.  Will continue to monitor.     Expected Outcomes  Patient will continue to attend sessions and complete the program.        ITP  Comments: ITP Comments    Row Name 04/09/18 1305           ITP Comments  Cardiac Rehab services have been suspended due to the COVID-19 restrictions starting 04/05/2018. Patient will restart the progam when restrictions have been lifted.          Comments: ITP REVIEW Patient doing well in the program. Cardiac Rehab services have been suspended due to the COVID-19 restrictions starting 04/05/2018. Patient will restart the progam when restrictions have been lifted. Will continue to monitor.

## 2018-04-12 ENCOUNTER — Encounter (HOSPITAL_COMMUNITY): Payer: BLUE CROSS/BLUE SHIELD

## 2018-04-14 ENCOUNTER — Encounter (HOSPITAL_COMMUNITY): Payer: BLUE CROSS/BLUE SHIELD

## 2018-04-16 ENCOUNTER — Encounter (HOSPITAL_COMMUNITY): Payer: BLUE CROSS/BLUE SHIELD

## 2018-04-19 ENCOUNTER — Encounter (HOSPITAL_COMMUNITY): Payer: BLUE CROSS/BLUE SHIELD

## 2018-04-21 ENCOUNTER — Encounter (HOSPITAL_COMMUNITY): Payer: BLUE CROSS/BLUE SHIELD

## 2018-04-23 ENCOUNTER — Encounter (HOSPITAL_COMMUNITY): Payer: BLUE CROSS/BLUE SHIELD

## 2018-04-26 ENCOUNTER — Encounter (HOSPITAL_COMMUNITY): Payer: BLUE CROSS/BLUE SHIELD

## 2018-04-28 ENCOUNTER — Encounter (HOSPITAL_COMMUNITY): Payer: BLUE CROSS/BLUE SHIELD

## 2018-04-29 ENCOUNTER — Telehealth (HOSPITAL_COMMUNITY): Payer: Self-pay

## 2018-04-29 NOTE — Telephone Encounter (Signed)
Called patient to verify her e-mail address and to check in on her exercise progress during our closure for COVID-19. Left voice mail to return call.

## 2018-04-30 ENCOUNTER — Encounter (HOSPITAL_COMMUNITY): Payer: BLUE CROSS/BLUE SHIELD

## 2018-05-03 ENCOUNTER — Encounter (HOSPITAL_COMMUNITY): Payer: BLUE CROSS/BLUE SHIELD

## 2018-05-05 ENCOUNTER — Encounter (HOSPITAL_COMMUNITY): Payer: BLUE CROSS/BLUE SHIELD

## 2018-05-07 ENCOUNTER — Encounter (HOSPITAL_COMMUNITY): Payer: BLUE CROSS/BLUE SHIELD

## 2018-05-11 ENCOUNTER — Telehealth (HOSPITAL_COMMUNITY): Payer: Self-pay

## 2018-05-11 NOTE — Telephone Encounter (Signed)
Called patient to see if she would be interested in participating in virtual CR until we reopen and to check on her at home exercise progress. Left a message for her to return call.

## 2018-05-12 ENCOUNTER — Telehealth (HOSPITAL_COMMUNITY): Payer: Self-pay | Admitting: *Deleted

## 2018-05-12 NOTE — Telephone Encounter (Signed)
Called patient to inform about the virtual CR program. Patient states she is interested in attending the virtual classes. Will call her once we get it started.

## 2018-05-17 ENCOUNTER — Telehealth: Payer: Self-pay | Admitting: Cardiovascular Disease

## 2018-05-17 NOTE — Telephone Encounter (Signed)
Patient verbally consented for telehealth visits with [facility] and understands that her insurance company will be billed for the encounter.

## 2018-05-17 NOTE — Telephone Encounter (Signed)
Can make a virtual video visit with me this Friday or next week.

## 2018-05-17 NOTE — Telephone Encounter (Signed)
Wanting to be seen or have virtual visit asap due to worsening symptoms.   She complains about not being able to walk with out giving out and her SOB is worse

## 2018-05-18 ENCOUNTER — Encounter: Payer: Self-pay | Admitting: Student

## 2018-05-18 ENCOUNTER — Telehealth (INDEPENDENT_AMBULATORY_CARE_PROVIDER_SITE_OTHER): Payer: BLUE CROSS/BLUE SHIELD | Admitting: Student

## 2018-05-18 VITALS — Ht 67.0 in | Wt 179.0 lb

## 2018-05-18 DIAGNOSIS — Z8774 Personal history of (corrected) congenital malformations of heart and circulatory system: Secondary | ICD-10-CM

## 2018-05-18 DIAGNOSIS — R0609 Other forms of dyspnea: Secondary | ICD-10-CM | POA: Diagnosis not present

## 2018-05-18 DIAGNOSIS — Q249 Congenital malformation of heart, unspecified: Secondary | ICD-10-CM | POA: Diagnosis not present

## 2018-05-18 DIAGNOSIS — Z7189 Other specified counseling: Secondary | ICD-10-CM | POA: Diagnosis not present

## 2018-05-18 NOTE — Patient Instructions (Signed)
Medication Instructions:  Your physician recommends that you continue on your current medications as directed. Please refer to the Current Medication list given to you today.   Labwork: NONE  Testing/Procedures: Your physician has requested that you have an echocardiogram. Echocardiography is a painless test that uses sound waves to create images of your heart. It provides your doctor with information about the size and shape of your heart and how well your heart's chambers and valves are working. This procedure takes approximately one hour. There are no restrictions for this procedure.    Follow-Up: Your physician recommends that you schedule a follow-up appointment in: 4 Weeks with Dr. Bronson Ing in [place].    Any Other Special Instructions Will Be Listed Below (If Applicable).     If you need a refill on your cardiac medications before your next appointment, please call your pharmacy.  Thank you for choosing [facility]!

## 2018-05-18 NOTE — Progress Notes (Signed)
Virtual Visit via Video Note   This visit type was conducted due to national recommendations for restrictions regarding the COVID-19 Pandemic (e.g. social distancing) in an effort to limit this patient's exposure and mitigate transmission in our community.  Due to her co-morbid illnesses, this patient is at least at moderate risk for complications without adequate follow up.  This format is felt to be most appropriate for this patient at this time.  All issues noted in this document were discussed and addressed.  A limited physical exam was performed with this format.  Please refer to the patient's chart for her consent to telehealth for [facility].   Evaluation Performed:  Follow-up visit  Date:  05/18/2018   ID:  Kristy Lewis, DOB 1961-01-15, MRN [id number]  Patient Location: Home Provider Location: Home  PCP:  Monico Blitz, MD  Cardiologist:  Kate Sable, MD  Electrophysiologist:  None   Chief Complaint:  Worsening Dyspnea on Exertion  History of Present Illness:    Kristy Lewis is a 58 y.o. 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   She was last examined by Dr. Bronson Ing in 02/2018 and was still having episodic chest pain and dyspnea on exertion. No changes were made to her cardiac regimen but she was started on Omeprazole given the recent use of NSAIDS. She called the office in 03/2018 and reported exertional symptoms when performing household chores and it was recommended she have a Coronary CTA to evaluate for any post-surgical changes but this was delayed given COVID-19.   In talking with the patient today, she reports having episodic chest pain and dyspnea on exertion since her surgery in 10/2017 but feels  like symptoms have worsened over the past 1-2 months. Describes having dyspnea with minimal activity, such as sweeping her front porch. Her chest discomfort can occur at rest or with activity and typically improves when she stops and takes deep breaths. Discomfort is mostly along her sternum. Pain usually resolves with 30 seconds. Also reports worsening episodes at night which does resemble when she had a pericardial effusion in the past. Denies any recent orthopnea, PND, or edema. Weight has overall been stable, increasing by 4 lbs within the past few months but she also reports being less active during this timeframe.   The patient does not have symptoms concerning for COVID-19 infection (fever, chills, cough, or new shortness of breath).    Past Medical History:  Diagnosis Date  . Anginal pain ([place])    Comes and goes, pt states its normal to have every day   . ASD (atrial septal defect), sinus venosus defect   . Bipolar disorder ([place])    pt denies  . Cardiac murmur   . Carpal tunnel syndrome   . Depressive disorder   . Dysphagia   . Dyspnea    Upon exertion  . Esophageal reflux   . Genital herpes    at age 6  . Hypercholesteremia   . Migraines   . 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 Urinary incontinence    Past Surgical History:  Procedure Laterality Date  . ABDOMINAL HYSTERECTOMY     with bladder surgery. Ovaries remained  . ASD REPAIR N/A 10/21/2017  Procedure: MINIMALLY INVASIVE REPAIR OF SINUS VENOSUS ATRIAL SEPTAL DEFECT (ASD), CORRECTION OF PARTIAL ANOMALOUS PULMONARY VENOUS RETURN, CLOSURE OF PATENT FORAMEN OVALE;  Surgeon: Rexene Alberts, MD;  Location: [facility];  Service: Open Heart Surgery;  Laterality: N/A;  . COLONOSCOPY  10/15/2011   Procedure: COLONOSCOPY;  Surgeon: Rogene Houston, MD;  [facility];  Service: Endoscopy;  Laterality: N/A;  200  . HEMORRHOID SURGERY    . left  shoulder manipulation    . RIGHT/LEFT HEART CATH AND CORONARY ANGIOGRAPHY N/A 08/05/2017   Procedure: RIGHT/LEFT HEART CATH AND CORONARY ANGIOGRAPHY;  Surgeon: Belva Crome, MD;  Location: [facility];  Service: Cardiovascular;  Laterality: N/A;  . TEE WITHOUT CARDIOVERSION N/A 10/21/2017   Procedure: TRANSESOPHAGEAL ECHOCARDIOGRAM (TEE);  Surgeon: Rexene Alberts, MD;  Location: [facility];  Service: Open Heart Surgery;  Laterality: N/A;  . TONSILLECTOMY       Current Meds  Medication Sig  . acetaminophen (TYLENOL) 500 MG tablet Take 2 tablets (1,000 mg total) by mouth every 6 (six) hours as needed.  Marland Kitchen amoxicillin-clavulanate (AUGMENTIN) 875-125 MG tablet Take 1 tablet by mouth 2 (two) times daily with a meal.  . bisacodyl (DULCOLAX) 5 MG EC tablet Take 5 mg by mouth daily as needed for moderate constipation.  . diazepam (VALIUM) 2 MG tablet Take 2 mg by mouth 2 (two) times daily as needed for anxiety.   Marland Kitchen FLUoxetine (PROZAC) 40 MG capsule Take 40 mg by mouth daily.   Marland Kitchen ibuprofen (ADVIL) 200 MG tablet Take 200mg  by mouth twice a day x 7 days, then followed by 200mg  daily thereafter. (Patient taking differently: daily as needed for headache or mild pain. )  . Multiple Vitamin (MULTIVITAMIN WITH MINERALS) TABS tablet Take 1 tablet by mouth daily.  . nitroGLYCERIN (NITROSTAT) 0.4 MG SL tablet Place 1 tablet (0.4 mg total) under the tongue every 5 (five) minutes as needed for chest pain.  Marland Kitchen omeprazole (PRILOSEC) 20 MG capsule Take 1 capsule (20 mg total) by mouth daily.  . SUMAtriptan (IMITREX) 50 MG tablet Take 50 mg by mouth every 2 (two) hours as needed for migraine. May repeat in 2 hours if headache persists or recurs.  . traZODone (DESYREL) 50 MG tablet Take 50 mg by mouth at bedtime.     Allergies:   Iodine; Morphine and related; and Shellfish allergy   Social History   Tobacco Use  . Smoking status: Former Smoker    Packs/day: 0.50    Last attempt to quit: 08/05/2017    Years  since quitting: 0.7  . Smokeless tobacco: Never Used  . Tobacco comment: quit 2 yrs ago after smoking for over 20 yrs.  Substance Use Topics  . Alcohol use: Not Currently    Alcohol/week: 2.0 - 3.0 standard drinks    Types: 2 - 3 Glasses of wine per week    Comment: socially  . Drug use: No     Family Hx: The patient's family history includes Hypertension in her mother.  ROS:   Please see the history of present illness.     All other systems reviewed and are negative.   Prior CV studies:   The following studies were reviewed today:  Echocardiogram: 12/2017 Study Conclusions  - Limited study to evaluate pericardial effusion. - Left ventricle: Systolic function was normal. The estimated   ejection fraction was in the range of 55% to 60%. Wall motion was   normal; there were no regional wall  motion abnormalities. Left   ventricular diastolic function parameters were normal. - Systemic veins: IVC is small, suggesting low RA pressure and   hypovolemia. - Pericardium, extracardiac: There is a small circumferential   pericardial effusion, measures 0.9 cm in diastole adjacent to the   LV. Small area near RV apex either prominent dense fat pad or   small area of loculated effusion. The effusion is significantly   smaller compared to the 12/24/17 study.  Coronary CT: 09/2017 IMPRESSION: 1.  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  Anomalous RSPV draining into the SVC  3.  Severe RA/RV enlargement  4. Normal right dominant coronary arteries with no calcium seen better on CT 08/26/17  Cardiac Catheterization: 07/2017  Normal coronary arteries.  Normal LV size and function.  Normal pulmonary artery pressures.  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    Qp/Qs greater than 2.7:1 (using IVC O2 sat as mixed  venous in calculation).  RECOMMENDATIONS:   Needs to have morphology identified to determine pulmonary vein drainage, and exclude ASD.  Discussed morphologic evaluation with Dr. Ena Dawley and the next step should be cardiac CT Angio.   Labs/Other Tests and Data Reviewed:    EKG:  No ECG reviewed.  Recent Labs: 10/19/2017: ALT 17 10/22/2017: Magnesium 2.4 12/11/2017: B Natriuretic Peptide 165.0; Hemoglobin 12.3; Platelets 305 01/01/2018: BUN 14; Creatinine, Ser 0.71; Potassium 4.1; Sodium 137   Recent Lipid Panel No results found for: CHOL, TRIG, HDL, CHOLHDL, LDLCALC, LDLDIRECT  Wt Readings from Last 3 Encounters:  05/18/18 179 lb (81.2 kg)  02/26/18 176 lb (79.8 kg)  02/10/18 175 lb 12.8 oz (79.7 kg)     Objective:    Vital Signs:  Ht 5\' 7"  (1.702 m)   Wt 179 lb (81.2 kg)   BMI 28.04 kg/m    General: Pleasant, Caucasian female appearing in NAD. Psych: Normal affect. Neuro: Alert and oriented X 3.  HEENT: Normal  Lungs:  Respirations appear regular and unlabored.  ASSESSMENT & PLAN:    1. Dyspnea on Exertion - difficult scenario as she reports having symptoms prior to her ASD repair and symptoms persisted afterwards but she feels like they have acutely worsened over the past 1-2 months. Experiences dyspnea with minimal activity and also reports chest discomfort which can occur at rest or with activity. Believes her discomfort resembles when she had an effusion in the past but also reports having chest discomfort since her surgery.  - had normal cors by cath in 07/2017. Will plan for an echocardiogram later this week for initial assessment. If recurrent pericardial effusion, would plan to restart Colchicine and NSAIDS. If unrevealing, would obtain a repeat Coronary CT once able to do so as previously recommended.   2. 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 Will plan for repeat echocardiogram as outlined above. If unrevealing, continue with plans for Coronary CT once COVD-19 situation improves.   3. COVID-19 Education The signs and symptoms of COVID-19 were discussed with the patient, The importance of social distancing was discussed today.  Time:   Today, I have spent 27 minutes with the patient with telehealth technology discussing the above problems.     Medication Adjustments/Labs and Tests Ordered: Current medicines are  reviewed at length with the patient today.  Concerns regarding medicines are outlined above.   Tests Ordered: Orders Placed This Encounter  Procedures  . ECHOCARDIOGRAM COMPLETE    Medication Changes: No orders of the defined types were placed in this encounter.   Disposition: Keep scheduled follow-up with Dr. Bronson Ing for next month.   Signed, Erma Heritage, PA-C  05/18/2018 6:13 PM    [facility]

## 2018-05-19 ENCOUNTER — Inpatient Hospital Stay (HOSPITAL_COMMUNITY): Admission: RE | Admit: 2018-05-19 | Payer: BLUE CROSS/BLUE SHIELD | Source: Ambulatory Visit

## 2018-05-20 ENCOUNTER — Ambulatory Visit (HOSPITAL_COMMUNITY)
Admission: RE | Admit: 2018-05-20 | Discharge: 2018-05-20 | Disposition: A | Discharge status: Home / Self Care | Payer: BLUE CROSS/BLUE SHIELD | Source: Ambulatory Visit | Attending: Student | Admitting: Student

## 2018-05-20 ENCOUNTER — Other Ambulatory Visit: Payer: Self-pay

## 2018-05-20 DIAGNOSIS — R0609 Other forms of dyspnea: Secondary | ICD-10-CM | POA: Insufficient documentation

## 2018-05-20 DIAGNOSIS — Q249 Congenital malformation of heart, unspecified: Secondary | ICD-10-CM | POA: Diagnosis not present

## 2018-05-20 NOTE — Progress Notes (Signed)
*  PRELIMINARY RESULTS* Echocardiogram 2D Echocardiogram has been performed.  Leavy Cella 05/20/2018, 11:47 AM

## 2018-05-21 ENCOUNTER — Telehealth: Payer: Self-pay | Admitting: Cardiovascular Disease

## 2018-05-21 ENCOUNTER — Telehealth: Payer: Self-pay | Admitting: *Deleted

## 2018-05-21 NOTE — Telephone Encounter (Signed)
Already notified.    ===================================================  Notes recorded by P[place]ix, Marikay Alar, LPN on 09/22/5699 at 77:93 PM EDT Pt notified and states she was told by PCP she may need PFT's done. ------  Notes recorded by Erma Heritage, PA-C on 05/20/2018 at 3:56 PM EDT Please let the patient know her echocardiogram showed normal pumping function of the heart with a preserved EF of 55-60%. No wall motion abnormalities and no recurrent pericardial effusion. No residual shunt was noted along her ASD closure site. Please let her know I did speak with Dr. Bronson Ing about her symptoms and he recommended a repeat Coronary CT as previously discussed. Orders had already been entered for this but never scheduled due to COVID-19. Please re-enter the orders if necessary and if patient in agreement. Would likely not be for several weeks as they are doing outpatient studies on a limited basis. Associations for study are dyspnea on exertion and congenital heart disease. Please forward a copy of her echo report to Monico Blitz, MD.

## 2018-05-21 NOTE — Telephone Encounter (Signed)
Pt notified and voiced understanding 

## 2018-05-21 NOTE — Telephone Encounter (Signed)
Patient called stating that she would like to get test results of echo performed at [facility] 05/20/2018.

## 2018-05-21 NOTE — Telephone Encounter (Signed)
-----   Message from Erma Heritage, [place] sent at 05/21/2018  1:34 PM EDT ----- That would need to be obtained by her PCP.   Thanks,  [place]  ----- Message ----- From: Levonne Hubert, LPN Sent: 05/22/8411  24:40 PM EDT To: Herminio Commons, MD, #  Pt notified and states she was told by PCP she may need PFT's done.

## 2018-05-24 ENCOUNTER — Telehealth (HOSPITAL_COMMUNITY): Payer: Self-pay

## 2018-05-24 NOTE — Telephone Encounter (Signed)
Called patient to check on her home exercise progress. No answer/voice mail box was full.

## 2018-05-24 NOTE — Telephone Encounter (Signed)
Patient returned call. She is doing well and is trying to walk for exercise. She says she gets SOB after walking a short distance. She says she had an ECHO which was good and her cardiologist may send her for some type of pulmonary test she wasn't sure about the details. Informed patient we hope to have our home based program ready in a few weeks. Will follow up when our home base program is ready.

## 2018-05-31 ENCOUNTER — Ambulatory Visit: Payer: BLUE CROSS/BLUE SHIELD | Admitting: Cardiovascular Disease

## 2018-06-15 ENCOUNTER — Telehealth (INDEPENDENT_AMBULATORY_CARE_PROVIDER_SITE_OTHER): Payer: BLUE CROSS/BLUE SHIELD | Admitting: Cardiovascular Disease

## 2018-06-15 ENCOUNTER — Encounter: Payer: Self-pay | Admitting: Cardiovascular Disease

## 2018-06-15 ENCOUNTER — Other Ambulatory Visit: Payer: Self-pay

## 2018-06-15 VITALS — Ht 67.0 in | Wt 180.0 lb

## 2018-06-15 DIAGNOSIS — R0609 Other forms of dyspnea: Secondary | ICD-10-CM

## 2018-06-15 DIAGNOSIS — Z8774 Personal history of (corrected) congenital malformations of heart and circulatory system: Secondary | ICD-10-CM

## 2018-06-15 DIAGNOSIS — Q249 Congenital malformation of heart, unspecified: Secondary | ICD-10-CM

## 2018-06-15 NOTE — Patient Instructions (Signed)
Medication Instructions:  Continue all current medications.  Labwork: none  Testing/Procedures: none  Follow-Up: 4 months   Any Other Special Instructions Will Be Listed Below (If Applicable).  If you need a refill on your cardiac medications before your next appointment, please call your pharmacy.\ 

## 2018-06-15 NOTE — Progress Notes (Signed)
Virtual Visit via Video Note   This visit type was conducted due to national recommendations for restrictions regarding the COVID-19 Pandemic (e.g. social distancing) in an effort to limit this patient's exposure and mitigate transmission in our community.  Due to her co-morbid illnesses, this patient is at least at moderate risk for complications without adequate follow up.  This format is felt to be most appropriate for this patient at this time.  All issues noted in this document were discussed and addressed.  A limited physical exam was performed with this format.  Please refer to the patient's chart for her consent to telehealth for [facility].   Date:  06/15/2018   ID:  Kristy Lewis, DOB 1960-03-26, MRN [id number]  Patient Location: Home Provider Location: Office  PCP:  Monico Blitz, MD  Cardiologist:  Kate Sable, MD  Electrophysiologist:  None   Evaluation Performed:  Follow-Up Visit  Chief Complaint:  DOE, CHD  History of Present Illness:    Kristy Lewis is a 58 y.o. 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   I last saw her in February 2020 and she was experiencing chest pain and exertional dyspnea.  I subsequently ordered a coronary CT angiogram but it has been delayed due to [place].  She had a virtual visit with B. Strader PA-C on 05/18/2018 complaining of similar symptoms.  An echocardiogram was ordered and was performed on 05/20/2018 which demonstrated normal cardiac function and no residual shunt at site of ASD repair.  She is awaiting coronary CT angiography.  PCP may be pursuing pulmonary function testing.  She continues to have exertional dyspnea but symptoms appear to be slowly but gradually  improving.  She is back to working in [employer].  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  She is applying for partial disability.  She denies exertional chest pain on a consistent basis.  She denies leg and feet swelling.  She denies orthopnea and paroxysmal nocturnal dyspnea.  The patient does not have symptoms concerning for COVID-19 infection (fever, chills, cough, or new shortness of breath).    Past Medical History:  Diagnosis Date  . Anginal pain ([place])    Comes and goes, pt states its normal to have every day   . ASD (atrial septal defect), sinus venosus defect   . Bipolar disorder ([place])    pt denies  . Cardiac murmur   . Carpal tunnel syndrome   . Depressive disorder   . Dysphagia   . Dyspnea    Upon exertion  . Esophageal reflux   . Genital herpes    at age 50  . Hypercholesteremia   . Migraines   . 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 Urinary incontinence    Past Surgical History:  Procedure Laterality Date  . ABDOMINAL HYSTERECTOMY     with bladder surgery. Ovaries remained  . ASD REPAIR N/A 10/21/2017   Procedure: MINIMALLY INVASIVE REPAIR OF SINUS VENOSUS ATRIAL SEPTAL DEFECT (ASD), CORRECTION OF PARTIAL ANOMALOUS PULMONARY VENOUS RETURN, CLOSURE OF PATENT FORAMEN OVALE;  Surgeon: Rexene Alberts, MD;  Location: [place];  Service: Open Heart Surgery;  Laterality: N/A;  . COLONOSCOPY  10/15/2011   Procedure: COLONOSCOPY;  Surgeon: Rogene Houston, MD;  [facility];  Service: Endoscopy;  Laterality: N/A;  200  . HEMORRHOID SURGERY    . left shoulder manipulation    . RIGHT/LEFT HEART CATH AND CORONARY ANGIOGRAPHY N/A 08/05/2017   Procedure: RIGHT/LEFT HEART CATH AND CORONARY ANGIOGRAPHY;  Surgeon: Belva Crome, MD;  Location: [place] [facility];  Service: Cardiovascular;  Laterality: N/A;  . TEE WITHOUT CARDIOVERSION N/A 10/21/2017    Procedure: TRANSESOPHAGEAL ECHOCARDIOGRAM (TEE);  Surgeon: Rexene Alberts, MD;  Location: [place];  Service: Open Heart Surgery;  Laterality: N/A;  . TONSILLECTOMY       Current Meds  Medication Sig  . acetaminophen (TYLENOL) 500 MG tablet Take 2 tablets (1,000 mg total) by mouth every 6 (six) hours as needed.  . bisacodyl (DULCOLAX) 5 MG EC tablet Take 5 mg by mouth daily as needed for moderate constipation.  . diazepam (VALIUM) 2 MG tablet Take 2 mg by mouth 2 (two) times daily as needed for anxiety.   Marland Kitchen FLUoxetine (PROZAC) 40 MG capsule Take 40 mg by mouth daily.   Marland Kitchen ibuprofen (ADVIL) 200 MG tablet Take 200mg  by mouth twice a day x 7 days, then followed by 200mg  daily thereafter. (Patient taking differently: daily as needed for headache or mild pain. )  . Multiple Vitamin (MULTIVITAMIN WITH MINERALS) TABS tablet Take 1 tablet by mouth daily.  . nitroGLYCERIN (NITROSTAT) 0.4 MG SL tablet Place 1 tablet (0.4 mg total) under the tongue every 5 (five) minutes as needed for chest pain.  Marland Kitchen omeprazole (PRILOSEC) 20 MG capsule Take 1 capsule (20 mg total) by mouth daily.  . SUMAtriptan (IMITREX) 50 MG tablet Take 50 mg by mouth every 2 (two) hours as needed for migraine. May repeat in 2 hours if headache persists or recurs.  . traZODone (DESYREL) 50 MG tablet Take 50 mg by mouth at bedtime.     Allergies:   Iodine; Morphine and related; and Shellfish allergy   Social History   Tobacco Use  . Smoking status: Former Smoker    Packs/day: 0.50    Last attempt to quit: 08/05/2017    Years since quitting: 0.8  . Smokeless tobacco: Never Used  . Tobacco comment: quit 2 yrs ago after smoking for over 20 yrs.  Substance Use Topics  . Alcohol use: Not Currently    Alcohol/week: 2.0 - 3.0 standard drinks    Types: 2 - 3 Glasses of wine per week    Comment: socially  . Drug use: No     Family Hx: The patient's family history includes Hypertension in her mother.  ROS:   Please see the history  of present illness.     All other systems reviewed and are negative.   Prior CV studies:   The following studies were reviewed today:  Coronary CT: 09/2017 IMPRESSION: 1. 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 Anomalous RSPV draining into the SVC  3. Severe RA/RV enlargement  4. Normal right dominant coronary arteries with no calcium seen better on CT 08/26/17  Cardiac Catheterization: 07/2017  Normal coronary arteries.  Normal LV size and function.  Normal pulmonary artery pressures.  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   Qp/Qs greater than 2.7:1 (using IVC O2 sat as mixed venous in calculation).   Echocardiogram  05/20/2018:   1. The left ventricle has normal systolic function, with an ejection fraction of 55-60%. The cavity size was normal. There is mild concentric left ventricular hypertrophy. Left ventricular diastolic parameters were normal. No evidence of left  ventricular regional wall motion abnormalities.  2. The right ventricle has mildly reduced systolic function. The cavity was mildly enlarged. There is no increase in right ventricular wall thickness.  3. 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 No significant residual shunt noted.  4. The mitral valve is grossly normal.  5. The tricuspid valve is grossly normal.  6. The aortic valve is grossly normal.  7. No pericardial effusion.   Labs/Other Tests and Data Reviewed:    EKG:  No ECG reviewed.  Recent Labs: 10/19/2017: ALT 17 10/22/2017: Magnesium 2.4 12/11/2017: B Natriuretic Peptide 165.0; Hemoglobin 12.3; Platelets 305 01/01/2018: BUN 14; Creatinine, Ser 0.71; Potassium 4.1; Sodium 137   Recent Lipid Panel No results found for: CHOL,  TRIG, HDL, CHOLHDL, LDLCALC, LDLDIRECT  Wt Readings from Last 3 Encounters:  06/15/18 180 lb (81.6 kg)  05/18/18 179 lb (81.2 kg)  02/26/18 176 lb (79.8 kg)     Objective:    Vital Signs:  Ht 5\' 7"  (1.702 m)   Wt 180 lb (81.6 kg)   BMI 28.19 kg/m    VITAL SIGNS:  reviewed GEN:  no acute distress EYES:  sclerae anicteric, EOMI - Extraocular Movements Intact RESPIRATORY:  normal respiratory effort, symmetric expansion MUSCULOSKELETAL:  no obvious deformities. NEURO:  alert and oriented x 3, no obvious focal deficit PSYCH:  normal affect  ASSESSMENT & PLAN:    1.  Dyspnea on exertion: Echocardiogram did not reveal any abnormalities with ASD closure.  Symptoms are gradually and slowly improving.  She has been scheduled for coronary CT angiography.  It appears her PCP may be obtaining pulmonary function testing as well.  2.  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 Echocardiogram did not reveal any abnormalities with ASD closure.  She has been scheduled for coronary CT angiography.    COVID-19 Education: The signs and symptoms of COVID-19 were discussed with the patient and how to seek care for testing (follow up with PCP or arrange E-visit).  The importance of social distancing was discussed today.  Time:   Today, I have spent 15 minutes with the patient with telehealth technology discussing the above problems.     Medication Adjustments/Labs and Tests Ordered: Current medicines are reviewed at length with the patient today.  Concerns regarding medicines are outlined above.   Tests Ordered: No orders of the defined types were placed in this encounter.   Medication Changes: No orders of the defined types were placed in this encounter.   Disposition:  Follow up 4 months  Signed, Kate Sable, MD  06/15/2018 9:44 AM      [facility]

## 2018-06-25 ENCOUNTER — Telehealth (HOSPITAL_COMMUNITY): Payer: Self-pay | Admitting: *Deleted

## 2018-06-25 NOTE — Telephone Encounter (Signed)
Called patient to check on her. She is not exercising on a regular basis. 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 She would like for [place] to call her when we reopen to see if she wants to attend the program at that time. Still interested in the home based program.

## 2018-06-29 ENCOUNTER — Telehealth (HOSPITAL_COMMUNITY): Payer: Self-pay

## 2018-06-29 NOTE — Telephone Encounter (Signed)
Called patient to check in and see if she plans to return to the program when we reopen and if she is going to participate in the home based program. Left VM to return call. Also stated in message if she does return call by Monday 6/15 we are assuming she is not returning and will be discharged from the program.

## 2018-07-13 ENCOUNTER — Telehealth (HOSPITAL_COMMUNITY): Payer: Self-pay

## 2018-07-13 NOTE — Telephone Encounter (Signed)
Called patient to tell her about our reopen plans in Cardiac Rehab. She says he has returned to work and will not be able to return to the program. She says she plans to [facility] open. She will be discharged and MD will be notified.

## 2018-07-16 NOTE — Addendum Note (Signed)
Encounter addended by: Dwana Melena, RN on: 07/16/2018 10:23 AM  Actions taken: Clinical Note Signed, Episode resolved

## 2018-07-16 NOTE — Progress Notes (Signed)
Discharge Progress Report  Patient Details  Name: Kristy Lewis MRN: [id number] Date of Birth: [birth year] Referring Provider:     CARDIAC REHAB PHASE II ORIENTATION from 02/10/2018 in [place]  Referring Provider  [name]       Number of Visits: 14  Reason for Discharge:  Early Exit:  Cardiac Rehab closed 04/05/18 due to COVID 19 restrictions. Patient was called to talk 07/13/18 and said she was back to work and would not be returning to the program. MD will be notified.  Smoking History:  Social History   Tobacco Use  Smoking Status Former Smoker  . Packs/day: 0.50  . Quit date: 08/05/2017  . Years since quitting: 0.9  Smokeless Tobacco Never Used  Tobacco Comment   quit 2 yrs ago after smoking for over 20 yrs.    Diagnosis:  ASD, spontaneous closure  ADL UCSD:   Initial Exercise Prescription: Initial Exercise Prescription - 02/10/18 0900      Date of Initial Exercise RX and Referring Provider   Date  02/10/18    Referring Provider  [name]    Expected Discharge Date  05/12/18      Treadmill   MPH  1.8    Grade  0    Minutes  17    METs  2.37      Recumbant Elliptical   Level  1    RPM  42    Watts  57    Minutes  22    METs  3.1      Prescription Details   Frequency (times per week)  3    Duration  Progress to 30 minutes of continuous aerobic without signs/symptoms of physical distress      Intensity   THRR 40-80% of Max Heartrate  107-123-138    Ratings of Perceived Exertion  11-13    Perceived Dyspnea  0-4      Progression   Progression  Continue to progress workloads to maintain intensity without signs/symptoms of physical distress.      Resistance Training   Training Prescription  Yes    Weight  1    Reps  10-15       Discharge Exercise Prescription (Final Exercise Prescription Changes): Exercise Prescription Changes - 04/12/18 0900      Response to Exercise   Blood Pressure (Admit)  122/64    Blood Pressure  (Exercise)  142/64    Blood Pressure (Exit)  106/70    Heart Rate (Admit)  81 bpm    Heart Rate (Exercise)  99 bpm    Heart Rate (Exit)  89 bpm    Rating of Perceived Exertion (Exercise)  12    Duration  Continue with 30 min of aerobic exercise without signs/symptoms of physical distress.    Intensity  THRR unchanged      Progression   Progression  Continue to progress workloads to maintain intensity without signs/symptoms of physical distress.    Average METs  3.75      Resistance Training   Training Prescription  Yes    Weight  2    Reps  10-15      Treadmill   MPH  2.5    Grade  1    Minutes  22    METs  3.3      Recumbant Elliptical   Level  2    RPM  61    Watts  75    Minutes  17  METs  4.2      Home Exercise Plan   Plans to continue exercise at  Home (comment)    Frequency  Add 2 additional days to program exercise sessions.    Initial Home Exercises Provided  02/10/18       Functional Capacity: 6 Minute Walk    Row Name 02/10/18 0917         6 Minute Walk   Phase  Initial     Distance  1400 feet     Walk Time  6 minutes     # of Rest Breaks  0     MPH  2.65     METS  3.03     RPE  10     Perceived Dyspnea   11     VO2 Peak  12.85     Symptoms  No     Resting HR  86 bpm     Resting BP  108/70     Resting Oxygen Saturation   97 %     Exercise Oxygen Saturation  during 6 min walk  97 %     Max Ex. HR  101 bpm     Max Ex. BP  124/72     2 Minute Post BP  110/68        Psychological, QOL, Others - Outcomes: PHQ 2/9: Depression screen PHQ 2/9 02/10/2018  Decreased Interest 0  Down, Depressed, Hopeless 1  PHQ - 2 Score 1  Altered sleeping 0  Tired, decreased energy 1  Change in appetite 1  Feeling bad or failure about yourself  0  Trouble concentrating 1  Moving slowly or fidgety/restless 0  Suicidal thoughts 0  PHQ-9 Score 4  Difficult doing work/chores Not difficult at all    Quality of Life: Quality of Life - 02/10/18 0921       Quality of Life   Select  Quality of Life      Quality of Life Scores   Health/Function Pre  11.67 %    Socioeconomic Pre  16.31 %    Psych/Spiritual Pre  20.36 %    Family Pre  20.5 %    GLOBAL Pre  15.73 %       Personal Goals: Goals established at orientation with interventions provided to work toward goal. Personal Goals and Risk Factors at Admission - 02/10/18 1141      Core Components/Risk Factors/Patient Goals on Admission    Weight Management  Yes    Intervention  Weight Management/Obesity: Establish reasonable short term and long term weight goals.    Admit Weight  175 lb 12.8 oz (79.7 kg)    Goal Weight: Short Term  170 lb 12.8 oz (77.5 kg)    Goal Weight: Long Term  165 lb 12.8 oz (75.2 kg)    Expected Outcomes  Short Term: Continue to assess and modify interventions until short term weight is achieved;Long Term: Adherence to nutrition and physical activity/exercise program aimed toward attainment of established weight goal    Personal Goal Other  Yes    Personal Goal  Be stronger, Look and feel better    Intervention  Attend CR class 3x week and supplement with home exercise 2 x week    Expected Outcomes  Reach personal goals        Personal Goals Discharge: Goals and Risk Factor Review    Row Name 02/25/18 0752 03/24/18 1441 04/12/18 1013         Core  Components/Risk Factors/Patient Goals Review   Personal Goals Review  Weight Management/Obesity Be stronger; look and feel better.   Weight Management/Obesity Be stronger; look and feel better.  Weight Management/Obesity Be stronger; look and feel better.      Review  Patient has completed 5 sessions maintaining her weight since her initial visit. She is new to the program. Will continue to monitor for progress.   Patient has completed 11 sessions gaining 1 lb since last 30 day review. She was out for 2 weeks with the flu and pneumonia. She states she as not come enough consistently to see any progress yet. Will  continue to monitor for progress.   Patient has completed 14 sessions maintaining her weight since last 30 day review. She continues to do well in the program with progression. She states she continues to feel stronger and feels like she looks healthier. She is pleased with her progress in the program. Outpatient cardiac services have been suspended due to the COVID-19 restrictions.  Will continue to monitor.      Expected Outcomes  Patient will continue to attend sessions and complete the program.   Patient will continue to attend sessions and complete the program.   Patient will continue to attend sessions and complete the program.         Exercise Goals and Review: Exercise Goals    Row Name 02/10/18 0919             Exercise Goals   Increase Physical Activity  Yes       Intervention  Provide advice, education, support and counseling about physical activity/exercise needs.;Develop an individualized exercise prescription for aerobic and resistive training based on initial evaluation findings, risk stratification, comorbidities and participant's personal goals.       Expected Outcomes  Short Term: Attend rehab on a regular basis to increase amount of physical activity.       Increase Strength and Stamina  Yes       Intervention  Provide advice, education, support and counseling about physical activity/exercise needs.;Develop an individualized exercise prescription for aerobic and resistive training based on initial evaluation findings, risk stratification, comorbidities and participant's personal goals.       Expected Outcomes  Short Term: Increase workloads from initial exercise prescription for resistance, speed, and METs.       Able to understand and use rate of perceived exertion (RPE) scale  Yes       Intervention  Provide education and explanation on how to use RPE scale       Expected Outcomes  Short Term: Able to use RPE daily in rehab to express subjective intensity level;Long Term:   Able to use RPE to guide intensity level when exercising independently       Able to understand and use Dyspnea scale  Yes       Intervention  Provide education and explanation on how to use Dyspnea scale       Expected Outcomes  Short Term: Able to use Dyspnea scale daily in rehab to express subjective sense of shortness of breath during exertion;Long Term: Able to use Dyspnea scale to guide intensity level when exercising independently       Knowledge and understanding of Target Heart Rate Range (THRR)  Yes       Intervention  Provide education and explanation of THRR including how the numbers were predicted and where they are located for reference       Expected Outcomes  Short Term:  Able to state/look up THRR       Able to check pulse independently  Yes       Intervention  Provide education and demonstration on how to check pulse in carotid and radial arteries.;Review the importance of being able to check your own pulse for safety during independent exercise       Expected Outcomes  Short Term: Able to explain why pulse checking is important during independent exercise;Long Term: Able to check pulse independently and accurately       Understanding of Exercise Prescription  Yes       Intervention  Provide education, explanation, and written materials on patient's individual exercise prescription       Expected Outcomes  Short Term: Able to explain program exercise prescription;Long Term: Able to explain home exercise prescription to exercise independently          Exercise Goals Re-Evaluation: Exercise Goals Re-Evaluation    Row Name 02/22/18 1456 03/22/18 1520 04/12/18 0918         Exercise Goal Re-Evaluation   Exercise Goals Review  Increase Physical Activity;Increase Strength and Stamina;Able to understand and use rate of perceived exertion (RPE) scale;Knowledge and understanding of Target Heart Rate Range (THRR);Able to check pulse independently;Understanding of Exercise Prescription   Increase Physical Activity;Increase Strength and Stamina;Able to understand and use rate of perceived exertion (RPE) scale;Knowledge and understanding of Target Heart Rate Range (THRR);Able to check pulse independently;Understanding of Exercise Prescription  Increase Physical Activity;Increase Strength and Stamina;Able to understand and use rate of perceived exertion (RPE) scale;Knowledge and understanding of Target Heart Rate Range (THRR);Able to check pulse independently;Understanding of Exercise Prescription     Comments  Pt. is new to the program, she has attended 5 sessions so far. She is doing well with the exercise and has been able to last the alotted times on the machines, which she was worried about in the beginning. We will continue to monitor and progess as tolerated.   Pt. has completed 10 sessions so far after returning from having the flu and pnemonia. She has tolerated exercise well since returning to her original workloads.   Pt. continues to do well in the program. She has now attended 14 sessions. She is tolerating the exercise well and will continue to increase workloads as we see fit.      Expected Outcomes  increase strength.   increase strength.   increase strength.         Nutrition & Weight - Outcomes: Pre Biometrics - 02/10/18 0920      Pre Biometrics   Height  5\' 6"  (1.676 m)    Waist Circumference  37 inches    Hip Circumference  40 inches    Waist to Hip Ratio  0.92 %    Triceps Skinfold  23 mm    % Body Fat  38.1 %    Grip Strength  8.7 kg    Single Leg Stand  21 seconds        Nutrition: Nutrition Therapy & Goals - 04/12/18 1012      Nutrition Therapy   RD appointment deferred  Yes      Personal Nutrition Goals   Comments  Patient was encourage to attend RD meeting. Patient states she is trying to eat less sugary foods. Will continue to monitor for progress.       Intervention Plan   Intervention  Nutrition handout(s) given to patient.        Nutrition Discharge: Nutrition Assessments -  02/10/18 1137      MEDFICTS Scores   Pre Score  42       Education Questionnaire Score: Knowledge Questionnaire Score - 02/10/18 1140      Knowledge Questionnaire Score   Pre Score  22/24

## 2018-08-27 ENCOUNTER — Other Ambulatory Visit: Payer: Self-pay | Admitting: Thoracic Surgery (Cardiothoracic Vascular Surgery)

## 2018-10-04 ENCOUNTER — Ambulatory Visit (INDEPENDENT_AMBULATORY_CARE_PROVIDER_SITE_OTHER): Payer: BC Managed Care – PPO

## 2018-10-04 ENCOUNTER — Ambulatory Visit (INDEPENDENT_AMBULATORY_CARE_PROVIDER_SITE_OTHER): Payer: BC Managed Care – PPO | Admitting: Pulmonary Disease

## 2018-10-04 ENCOUNTER — Other Ambulatory Visit: Payer: Self-pay

## 2018-10-04 ENCOUNTER — Encounter: Payer: Self-pay | Admitting: Pulmonary Disease

## 2018-10-04 VITALS — BP 118/76 | HR 70 | Temp 97.4°F | Ht 66.0 in | Wt 185.6 lb

## 2018-10-04 DIAGNOSIS — R0602 Shortness of breath: Secondary | ICD-10-CM

## 2018-10-04 LAB — CBC WITH DIFFERENTIAL/PLATELET
Basophils Absolute: 0 10*3/uL (ref 0.0–0.1)
Basophils Relative: 0.6 % (ref 0.0–3.0)
Eosinophils Absolute: 0.1 10*3/uL (ref 0.0–0.7)
Eosinophils Relative: 1.8 % (ref 0.0–5.0)
HCT: 39.3 % (ref 36.0–46.0)
Hemoglobin: 13.2 g/dL (ref 12.0–15.0)
Lymphocytes Relative: 37.8 % (ref 12.0–46.0)
Lymphs Abs: 1.9 10*3/uL (ref 0.7–4.0)
MCHC: 33.5 g/dL (ref 30.0–36.0)
MCV: 95.2 fl (ref 78.0–100.0)
Monocytes Absolute: 0.3 10*3/uL (ref 0.1–1.0)
Monocytes Relative: 6.7 % (ref 3.0–12.0)
Neutro Abs: 2.6 10*3/uL (ref 1.4–7.7)
Neutrophils Relative %: 53.1 % (ref 43.0–77.0)
Platelets: 153 10*3/uL (ref 150.0–400.0)
RBC: 4.13 Mil/uL (ref 3.87–5.11)
RDW: 13.9 % (ref 11.5–15.5)
WBC: 4.9 10*3/uL (ref 4.0–10.5)

## 2018-10-04 MED ORDER — ANORO ELLIPTA 62.5-25 MCG/INH IN AEPB: 1.0000 | INHALATION_SPRAY | Freq: Every day | RESPIRATORY_TRACT | 3 refills | Status: DC

## 2018-10-04 MED ORDER — ANORO ELLIPTA 62.5-25 MCG/INH IN AEPB: 1.0000 | INHALATION_SPRAY | Freq: Every day | RESPIRATORY_TRACT | 0 refills | Status: DC

## 2018-10-04 NOTE — Progress Notes (Signed)
Kristy Lewis    [id number]    08-Mar-1960  Primary Care Physician:Shah, Weldon Picking, MD  Referring Physician: Monico Blitz, [place],  [place] [address]  Chief complaint: Consult for dyspnea  HPI: Kristy Lewis presents for evaluation of dyspnea She has history of mitral valve prolapse, pericardial effusion with pericarditis, ASD/PFO closure, GERD Complains of dyspnea on exertion.  Symptoms started about a year ago after she had had surgery for ASD/PFO.  She also has substernal chest pain which radiates to the back.  Reports that the chest pain has been going on for 2 years Minimal symptoms at rest.  No cough, sputum production, wheezing  History noted for ASD/PFO closure in October of 2019.  Evaluated in the ED in Nov of 2019 with chest pain.  Noted to have pericardial effusion.  She was treated with colchicine and sent for this. She is on Prozac for depression and notes weight gain of over 20 pounds for the past year and feels this is contributing to her presentation.  Pets: Has 2 dogs, total and official.  No birds, farm animals Occupation: Theme park manager.  Used to work in [employer] at age 77 Exposures: Has a hot tub which he uses regularly.  Does not have mold in it.  No dampness, humidifiers Exposed to hairspray and chemicals in her line of work.  She does get short of breath when she is exposed to these Smoking history: 30-pack-year smoker.  Quit in 2008 Travel history: Lived in [place] as a child.  No significant recent travel Relevant family history: No significant family history of lung disease  Outpatient Encounter Medications as of 10/04/2018  Medication Sig   acetaminophen (TYLENOL) 500 MG tablet Take 2 tablets (1,000 mg total) by mouth every 6 (six) hours as needed.   bisacodyl (DULCOLAX) 5 MG EC tablet Take 5 mg by mouth daily as needed for moderate constipation.   diazepam (VALIUM) 2 MG tablet Take 2 mg by mouth 2 (two) times daily as  needed for anxiety.    FLUoxetine (PROZAC) 40 MG capsule Take 40 mg by mouth daily.    ibuprofen (ADVIL) 200 MG tablet Take 200mg  by mouth twice a day x 7 days, then followed by 200mg  daily thereafter. (Patient taking differently: daily as needed for headache or mild pain. )   Multiple Vitamin (MULTIVITAMIN WITH MINERALS) TABS tablet Take 1 tablet by mouth daily.   nitroGLYCERIN (NITROSTAT) 0.4 MG SL tablet Place 1 tablet (0.4 mg total) under the tongue every 5 (five) minutes as needed for chest pain.   omeprazole (PRILOSEC) 20 MG capsule Take 1 capsule (20 mg total) by mouth daily.   SUMAtriptan (IMITREX) 50 MG tablet Take 50 mg by mouth every 2 (two) hours as needed for migraine. May repeat in 2 hours if headache persists or recurs.   traZODone (DESYREL) 50 MG tablet Take 50 mg by mouth at bedtime.   No facility-administered encounter medications on file as of 10/04/2018.     Allergies as of 10/04/2018 - Review Complete 10/04/2018  Allergen Reaction Noted   Iodine Swelling and Rash 09/15/2011   Morphine and related Itching and Other (See Comments) 09/15/2011   Shellfish allergy Swelling and Rash 09/15/2011    Past Medical History:  Diagnosis Date   Anginal pain ([place])    Comes and goes, pt states its normal to have every day    ASD (atrial septal defect), sinus venosus defect    Bipolar  disorder [facility])    pt denies   Cardiac murmur    Carpal tunnel syndrome    Depressive disorder    Dysphagia    Dyspnea    Upon exertion   Esophageal reflux    Genital herpes    at age 25   Hypercholesteremia    Migraines    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 Ovaries remained   ASD REPAIR N/A 10/21/2017   Procedure: MINIMALLY INVASIVE REPAIR OF SINUS VENOSUS  ATRIAL SEPTAL DEFECT (ASD), CORRECTION OF PARTIAL ANOMALOUS PULMONARY VENOUS RETURN, CLOSURE OF PATENT FORAMEN OVALE;  Surgeon: Rexene Alberts, MD;  Location: [facility];  Service: Open Heart Surgery;  Laterality: N/A;   COLONOSCOPY  10/15/2011   Procedure: COLONOSCOPY;  Surgeon: Rogene Houston, MD;  [facility];  Service: Endoscopy;  Laterality: N/A;  200   HEMORRHOID SURGERY     left shoulder manipulation     RIGHT/LEFT HEART CATH AND CORONARY ANGIOGRAPHY N/A 08/05/2017   Procedure: RIGHT/LEFT HEART CATH AND CORONARY ANGIOGRAPHY;  Surgeon: Belva Crome, MD;  Location: [facility] CV LAB;  Service: Cardiovascular;  Laterality: N/A;   TEE WITHOUT CARDIOVERSION N/A 10/21/2017   Procedure: TRANSESOPHAGEAL ECHOCARDIOGRAM (TEE);  Surgeon: Rexene Alberts, MD;  Location: [facility];  Service: Open Heart Surgery;  Laterality: N/A;   TONSILLECTOMY      Family History  Problem Relation Age of Onset   Hypertension Mother     Social History   Socioeconomic History   Marital status: Divorced    Spouse name: Not on file   Number of children: Not on file   Years of education: Not on file   Highest education level: Not on file  Occupational History   Occupation: Hair Dresser  Social Needs   Financial resource strain: Not on file   Food insecurity    Worry: Not on file    Inability: Not on file   Transportation needs    Medical: Not on file    Non-medical: Not on file  Tobacco Use   Smoking status: Former Smoker    Packs/day: 0.50    Quit date: 08/05/2017    Years since quitting: 1.1   Smokeless tobacco: Never Used   Tobacco comment: quit 2 yrs ago after smoking for over 20 yrs.  Substance and Sexual Activity   Alcohol use: Not Currently    Alcohol/week: 2.0 - 3.0 standard drinks    Types: 2 - 3 Glasses of wine per week    Comment: socially   Drug use: No   Sexual activity: Not on file  Lifestyle   Physical activity    Days per week: Not on file     Minutes per session: Not on file   Stress: Not on file  Relationships   Social connections    Talks on phone: Not on file    Gets together: Not on file    Attends religious service: Not on file    Active member of club or organization: Not on file    Attends meetings of clubs or organizations: Not on file    Relationship status: Not on file   Intimate partner violence    Fear of current or ex partner: Not on file    Emotionally abused: Not on file  Physically abused: Not on file    Forced sexual activity: Not on file  Other Topics Concern   Not on file  Social History Narrative   Not on file    Review of systems: Review of Systems  Constitutional: Negative for fever and chills.  HENT: Negative.   Eyes: Negative for blurred vision.  Respiratory: as per HPI  Cardiovascular: Negative for chest pain and palpitations.  Gastrointestinal: Negative for vomiting, diarrhea, blood per rectum. Genitourinary: Negative for dysuria, urgency, frequency and hematuria.  Musculoskeletal: Negative for myalgias, back pain and joint pain.  Skin: Negative for itching and rash.  Neurological: Negative for dizziness, tremors, focal weakness, seizures and loss of consciousness.  Endo/Heme/Allergies: Negative for environmental allergies.  Psychiatric/Behavioral: Negative for depression, suicidal ideas and hallucinations.  All other systems reviewed and are negative.  Physical Exam: Blood pressure 118/76, pulse 70, temperature (!) 97.4 F (36.3 C), temperature source Temporal, height 5\' 6"  (1.676 m), weight 185 lb 9.6 oz (84.2 kg), SpO2 96 %. Gen:      No acute distress HEENT:  EOMI, sclera anicteric Neck:     No masses; no thyromegaly Lungs:    Clear to auscultation bilaterally; normal respiratory effort CV:         Regular rate and rhythm; no murmurs Abd:      + bowel sounds; soft, non-tender; no palpable masses, no distension Ext:    No edema; adequate peripheral perfusion Skin:       Warm and dry; no rash Neuro: alert and oriented x 3 Psych: normal mood and affect  Data Reviewed: Imaging: CT chest 10/19/2017- linear scarring, subsegmental atelectasis in the right upper lobe, posterior left lower lobe.  CT 12/11/2017- no pulmonary embolism, mild bibasilar subsegmental atelectasis with pleural effusion.  Moderate pericardial effusion. Lower lobe consolidation with atelectasis.  I have reviewed the images personally.  PFTs: 10/08/2016 FVC 3.01 [82%], FEV1 2.53 [88%], F/F 84, TLC 5.37 [100%], DLCO 21.39 [79%] Air-trapping with no overt obstruction.  Minimal reduction in diffusion capacity which corrects alveolar volume.  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  Her imaging shows atelectasis with linear scarring.  There is no clear evidence of interstitial lung disease with no restriction on PFTs Start on Anoro inhaler  Check baseline labs including CBC differential, IgE, alpha-1 antitrypsin levels and phenotype She does have history of pericardial effusion.  I suspect this was secondary to her cardiac surgery As she has ongoing symptoms we will check ANA, CCP, rheumatoid factor to rule out any connective tissue disease Schedule chest x-ray and pulmonary function tests  Weight loss may be contributing to presentation.  Advised diet and exercise.  Plan/Recommendations: - Anoro - CBC, IgE, alpha-1 antitrypsin, ANA, CCP, rheumatoid factor - Chest x-ray, PFTs - Weight loss with diet and exercise.  Marshell Garfinkel MD [facility] 10/04/2018, 9:50 AM  CC: Monico Blitz, MD

## 2018-10-04 NOTE — Patient Instructions (Signed)
We will start you on an inhaler called Anoro and send in a prescription We will check CBC differential, IgE, alpha-1 antitrypsin levels and phenotype, ANA, CCP, rheumatoid factor today We will get a chest x-ray Schedule for PFTs Work on weight loss with diet and exercise  Follow-up in 1 to 2 months.

## 2018-10-09 LAB — ANTI-NUCLEAR AB-TITER (ANA TITER): ANA Titer 1: 1:80 {titer} — ABNORMAL HIGH

## 2018-10-09 LAB — ANA,IFA RA DIAG PNL W/RFLX TIT/PATN
Anti Nuclear Antibody (ANA): POSITIVE — AB
Cyclic Citrullin Peptide Ab: <16 UNITS
Rheumatoid fact SerPl-aCnc: <14 IU/mL (ref <14)

## 2018-10-09 LAB — ALPHA-1 ANTITRYPSIN PHENOTYPE: A-1 Antitrypsin, Ser: 131 mg/dL (ref 83–199)

## 2018-10-09 LAB — IGE: IgE (Immunoglobulin E), Serum: 756 kU/L — ABNORMAL HIGH (ref <=114)

## 2018-10-25 ENCOUNTER — Other Ambulatory Visit: Payer: Self-pay

## 2018-10-25 ENCOUNTER — Encounter: Payer: Self-pay | Admitting: Thoracic Surgery (Cardiothoracic Vascular Surgery)

## 2018-10-25 ENCOUNTER — Ambulatory Visit: Payer: BC Managed Care – PPO | Admitting: Thoracic Surgery (Cardiothoracic Vascular Surgery)

## 2018-10-25 VITALS — BP 128/86 | HR 66 | Temp 97.5°F | Resp 16 | Ht 66.0 in | Wt 185.0 lb

## 2018-10-25 DIAGNOSIS — Z8774 Personal history of (corrected) congenital malformations of heart and circulatory system: Secondary | ICD-10-CM | POA: Diagnosis not present

## 2018-10-25 NOTE — Progress Notes (Signed)
[address]       [place] [address]             [facility] OFFICE NOTE  Referring Provider is Herminio Commons, MD Primary Cardiologist is Kate Sable, MD PCP is Monico Blitz, MD   HPI:  Patient is a 58 year old female who returns the office today for routine follow-up status post minimally invasive repair of sinus venosus type atrial septal defect with partial anomalous pulmonary venous return on October 21, 2017. She was last seen here in our office on January 18, 2018 at which time she was doing well.  She underwent follow-up echocardiogram last April which demonstrated resolution of the previous pericardial effusion and normal left ventricular systolic function.  There remained mild right ventricular chamber enlargement with mildly reduced right ventricular systolic function.  No residual shunt could be appreciated at the atrial level.  She returns to our office for routine follow-up today.  She is back at work and overall getting along fairly well.  She does still complain of pressure across her chest with exertion.  She does not have any coronary artery disease in her symptoms are not convincing for angina.  She has been able to continue to abstain from any tobacco use.  She has gained some weight and she states that she is having some problems with reflux.  Overall she reports that she is feeling "much improved" in comparison with how she felt prior to surgery.   Current Outpatient Medications  Medication Sig Dispense Refill  . acetaminophen (TYLENOL) 500 MG tablet Take 2 tablets (1,000 mg total) by mouth every 6 (six) hours as needed. 30 tablet 0  . bisacodyl (DULCOLAX) 5 MG EC tablet Take 5 mg by mouth daily as needed for moderate constipation.    . diazepam (VALIUM) 2 MG tablet Take 2 mg by mouth 2 (two) times daily as needed for anxiety.     Marland Kitchen FLUoxetine (PROZAC) 40 MG capsule Take 40 mg by mouth daily.     Marland Kitchen  ibuprofen (ADVIL) 200 MG tablet Take 200mg  by mouth twice a day x 7 days, then followed by 200mg  daily thereafter. (Patient taking differently: daily as needed for headache or mild pain. )  0  . Multiple Vitamin (MULTIVITAMIN WITH MINERALS) TABS tablet Take 1 tablet by mouth daily.    Marland Kitchen omeprazole (PRILOSEC) 20 MG capsule Take 1 capsule (20 mg total) by mouth daily. 90 capsule 3  . traZODone (DESYREL) 50 MG tablet Take 50 mg by mouth at bedtime.    . nitroGLYCERIN (NITROSTAT) 0.4 MG SL tablet Place 1 tablet (0.4 mg total) under the tongue every 5 (five) minutes as needed for chest pain. (Patient not taking: Reported on 10/25/2018) 30 tablet 0  . SUMAtriptan (IMITREX) 50 MG tablet Take 50 mg by mouth every 2 (two) hours as needed for migraine. May repeat in 2 hours if headache persists or recurs.    Marland Kitchen umeclidinium-vilanterol (ANORO ELLIPTA) 62.5-25 MCG/INH AEPB Inhale 1 puff into the lungs daily. (Patient not taking: Reported on 10/25/2018) 7 each 0  . umeclidinium-vilanterol (ANORO ELLIPTA) 62.5-25 MCG/INH AEPB Inhale 1 puff into the lungs daily. (Patient not taking: Reported on 10/25/2018) 60 each 3   No current facility-administered medications for this visit.       Physical Exam:   BP 128/86   Pulse 66   Temp (!) 97.5 F (36.4 C)   Resp 16  Ht 5\' 6"  (1.676 m)   Wt 185 lb (83.9 kg)   SpO2 97% Comment: RA  BMI 29.86 kg/m   General:  Well-appearing  Chest:   Clear to auscultation  CV:   Regular rate and rhythm without murmur  Incisions:  Completely healed  Abdomen:  Soft nontender  Extremities:  Warm and well-perfused  Diagnostic Tests:    ECHOCARDIOGRAM REPORT         Patient Name:   SAMYUKTHA SOKOLSKI Date of Exam: 05/20/2018 Medical Rec #:  [id number]           Height:       67.0 in Accession #:    [id number]          Weight:       179.0 lb Date of Birth:  24-Dec-1960          BSA:          1.93 m Patient Age:    35 years            BP:           116/77 mmHg Patient Gender:  F                   HR:           80 bpm. Exam Location:  [facility]     Procedure: 2D Echo   Indications:    DOE   History:        Patient has prior history of Echocardiogram examinations, most                 recent 01/08/2018. Signs/Symptoms: Chest Pain Risk Factors:                 Former Smoker. ASD (atrial septal defect), sinus venosus defect,                 Right Ventricular Enlargement, Pericardial Effusion, Bipolar                 Disorder.   Sonographer:    Leavy Cella RDCS (AE) Referring Phys: [id number] [place]      1. The left ventricle has normal systolic function, with an ejection fraction of 55-60%. The cavity size was normal. There is mild concentric left ventricular hypertrophy. Left ventricular diastolic parameters were normal. No evidence of left  ventricular regional wall motion abnormalities.  2. The right ventricle has mildly reduced systolic function. The cavity was mildly enlarged. There is no increase in right ventricular wall thickness.  3. 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 No significant residual shunt noted.  4. The mitral valve is grossly normal.  5. The tricuspid valve is grossly normal.  6. The aortic valve is grossly normal.  7. No pericardial effusion.   FINDINGS  Left Ventricle: The left ventricle has normal systolic function, with an ejection fraction of 55-60%. The cavity size was normal. There is mild concentric left ventricular hypertrophy. Left ventricular diastolic parameters were normal. No evidence of  left ventricular regional wall motion abnormalities..   Right Ventricle: The right ventricle has mildly reduced systolic function. The cavity was mildly enlarged. There is no increase in right ventricular wall thickness.   Left Atrium: Left atrial size was normal in size.   Right Atrium: Right atrial size was normal  in size. Right atrial pressure is estimated at 3  mmHg.   Interatrial Septum: No atrial level shunt detected by color flow Doppler. 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 No significant residual shunt noted.   Pericardium: There is no evidence of pericardial effusion.   Mitral Valve: The mitral valve is grossly normal. Mitral valve regurgitation is not visualized by color flow Doppler.   Tricuspid Valve: The tricuspid valve is grossly normal. Tricuspid valve regurgitation was not visualized by color flow Doppler.   Aortic Valve: The aortic valve is grossly normal Aortic valve regurgitation was not visualized by color flow Doppler. There is no evidence of aortic valve stenosis.   Pulmonic Valve: The pulmonic valve was grossly normal. Pulmonic valve regurgitation is not visualized by color flow Doppler.       +----------------+----------++ Diastology                 +----------------+----------++ LV e' lateral:  10.90 cm/s +----------------+----------++ LV E/e' lateral:5.1        +----------------+----------++ LV e' medial:   6.45 cm/s  +----------------+----------++ LV E/e' medial: 8.6        +----------------+----------++     +---------------+---------++ RIGHT VENTRICLE          +---------------+---------++ RV S prime:    8.78 cm/s +---------------+---------++ TAPSE (M-mode):1.9 cm    +---------------+---------++   +---------------+-------++-----------++ LEFT ATRIUM           Index       +---------------+-------++-----------++ LA Vol (A2C):  38.6 ml20.01 ml/m +---------------+-------++-----------++ LA Vol (A4C):  29.1 ml15.08 ml/m +---------------+-------++-----------++ LA Biplane Vol:35.6 ml18.45 ml/m +---------------+-------++-----------++ +------------+---------++-----------++ RIGHT ATRIUM         Index        +------------+---------++-----------++ RA Area:    12.00 cm            +------------+---------++-----------++ RA Volume:  27.20 ml 14.10 ml/m +------------+---------++-----------++  +--------------+----------++ MITRAL VALVE             +--------------+----------++ MV Area (PHT):4.21 cm   +--------------+----------++ MV PHT:       52.20 msec +--------------+----------++ MV Decel Time:180 msec   +--------------+----------++ +--------------+----------++ MV E velocity:55.30 cm/s +--------------+----------++ MV A velocity:45.60 cm/s +--------------+----------++ MV E/A ratio: 1.21       +--------------+----------++     Kate Sable MD Electronically signed by Kate Sable MD Signature Date/Time: 05/20/2018/2:53:00 PM          Impression:  Patient is doing well approximately 1 year status post minimally invasive repair of sinus venosis atrial septal defect with partial anomalous pulmonary venous return.   Plan:  The patient will continue to follow-up intermittently with Dr. Bronson Ing.  She will call and return to our office in the future only should specific problems or questions arise.  All questions answered.  I spent in excess of 15 minutes during the conduct of this office consultation and >50% of this time involved direct face-to-face encounter with the patient for counseling and/or coordination of their care.    Valentina Gu. [name], MD 10/25/2018 11:09 AM

## 2018-10-25 NOTE — Patient Instructions (Signed)
Continue all previous medications without any changes at this time  

## 2018-10-27 ENCOUNTER — Telehealth: Payer: Self-pay

## 2018-10-27 DIAGNOSIS — R768 Other specified abnormal immunological findings in serum: Secondary | ICD-10-CM

## 2018-10-27 DIAGNOSIS — R0602 Shortness of breath: Secondary | ICD-10-CM

## 2018-10-27 DIAGNOSIS — R918 Other nonspecific abnormal finding of lung field: Secondary | ICD-10-CM

## 2018-10-27 NOTE — Telephone Encounter (Signed)
I called and spoke with patient in regards to her results. Made her aware of the procedures and referrals that will be placed. She showed understanding. Nothing further is needed at this time.

## 2018-10-28 ENCOUNTER — Telehealth: Payer: Self-pay | Admitting: Pulmonary Disease

## 2018-10-28 NOTE — Telephone Encounter (Signed)
Spoke with pt, went over her lab results and chest xray results because she had a bad connection when she was called the first time. Pt verbalized understanding and nothing further is needed.

## 2018-11-10 ENCOUNTER — Ambulatory Visit (HOSPITAL_COMMUNITY)
Admission: RE | Admit: 2018-11-10 | Discharge: 2018-11-10 | Disposition: A | Discharge status: Home / Self Care | Payer: BC Managed Care – PPO | Source: Ambulatory Visit | Attending: Pulmonary Disease | Admitting: Pulmonary Disease

## 2018-11-10 ENCOUNTER — Other Ambulatory Visit: Payer: Self-pay

## 2018-11-10 DIAGNOSIS — R0602 Shortness of breath: Secondary | ICD-10-CM | POA: Insufficient documentation

## 2018-11-10 DIAGNOSIS — R918 Other nonspecific abnormal finding of lung field: Secondary | ICD-10-CM | POA: Insufficient documentation

## 2018-11-16 ENCOUNTER — Telehealth: Payer: Self-pay | Admitting: Pulmonary Disease

## 2018-11-16 NOTE — Telephone Encounter (Signed)
Spoke with the pt  She is asking for results of chest ct from 11/10/18  Please advise, thanks

## 2018-11-17 NOTE — Telephone Encounter (Signed)
Pt returned missed call and would like a call back 

## 2018-11-17 NOTE — Telephone Encounter (Signed)
Please let her know that CT does not show any evidence of interstitial lung disease.  There is mild scarring which is unchanged compared to prior.  There are no acute abnormalities.

## 2018-11-17 NOTE — Telephone Encounter (Signed)
Spoke with the pt and notified of results per Dr Vaughan Browner  She verbalized understanding

## 2018-11-17 NOTE — Telephone Encounter (Signed)
ATC pt, no answer. Left message for pt to call back.  

## 2018-11-30 ENCOUNTER — Ambulatory Visit: Payer: BC Managed Care – PPO | Admitting: Pulmonary Disease

## 2018-12-13 ENCOUNTER — Ambulatory Visit: Payer: BC Managed Care – PPO | Admitting: Cardiovascular Disease

## 2018-12-13 ENCOUNTER — Encounter

## 2019-03-10 ENCOUNTER — Other Ambulatory Visit: Payer: Self-pay | Admitting: Cardiovascular Disease

## 2019-04-20 ENCOUNTER — Telehealth: Payer: Self-pay | Admitting: Physician Assistant

## 2019-04-20 NOTE — Telephone Encounter (Signed)
Pt called stating she is having chest pain. She states she has been working hard and then started having "strong chest pain" today. Chest pain has improved with time, but is still persistent. I advised that she should go to the ER. She is hesitant to do this and has asked me to text Dr. Bronson Ing. I advised that I could send him a message through Epic. I asked her to take 324 mg ASA and go to the ER. She does not think she needs to call EMS.

## 2019-04-21 ENCOUNTER — Other Ambulatory Visit: Payer: Self-pay | Admitting: Cardiovascular Disease

## 2019-04-25 NOTE — Progress Notes (Signed)
Cardiology Office Note  Date: 04/26/2019   Kristy Lewis, DOB Jan 29, 1960, MRN [id number]  PCP:  Monico Blitz, MD  Cardiologist:  Kate Sable, MD Electrophysiologist:  None   Chief Complaint: Follow-up dyspnea on exertion, congenital heart disease.  History of Present Illness:  Kristy Lewis is a 59 y.o. 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  Cardiac cath 2019 normal coronaries, pericardial effusion 01/08/2018.  Tobacco use.  Last seen by Dr. Bronson Ing on 06/15/2018.  He had previously ordered for coronary CT angiogram February 2020 secondary to complaints of chest pain and dyspnea but it had been delayed due to COVID-19 restrictions.  Patient had a virtual visit with Bernerd Pho 05/18/2018 complaining of similar symptoms of dyspnea.  Echocardiogram performed on 05/20/2018 showing normal cardiac function and no residual shunt at ASD site.  At this visit patient was still awaiting CT angiography.  Patient continued to have exertional dyspnea but symptoms appeared to be slowly but gradually improving.  Patient presents today and continues to complain of exertional dyspnea and chest pain.  States sometimes the pain radiates through to her back.  Rest relieves the pain and activity/movement aggravates the pain.  Continues to complain of dyspnea worse on activity.  She was previously scheduled coronary CTA which was never performed.  Her pulmonary provider ordered PFTs which were never performed.  She does have a history of smoking but has since stopped.   Past Medical History:  Diagnosis Date  . Anginal pain ([place])    Comes and goes, pt states its normal to have every day   . ASD (atrial septal defect), sinus venosus defect   . Bipolar disorder ([place])    pt denies  . Cardiac murmur   . Carpal tunnel syndrome     . Depressive disorder   . Dysphagia   . Dyspnea    Upon exertion  . Esophageal reflux   . Genital herpes    at age 62  . Hypercholesteremia   . Migraines   . 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 Urinary incontinence     Past Surgical History:  Procedure Laterality Date  . ABDOMINAL HYSTERECTOMY     with bladder surgery. Ovaries remained  . ASD REPAIR N/A 10/21/2017   Procedure: MINIMALLY INVASIVE REPAIR OF SINUS VENOSUS ATRIAL SEPTAL DEFECT (ASD), CORRECTION OF PARTIAL ANOMALOUS PULMONARY VENOUS RETURN, CLOSURE OF PATENT FORAMEN OVALE;  Surgeon: Rexene Alberts, MD;  Location: [facility];  Service: Open Heart Surgery;  Laterality: N/A;  . COLONOSCOPY  10/15/2011   Procedure: COLONOSCOPY;  Surgeon: Rogene Houston, MD;  [facility];  Service: Endoscopy;  Laterality: N/A;  200  . HEMORRHOID SURGERY    . left shoulder manipulation    . RIGHT/LEFT HEART CATH AND CORONARY ANGIOGRAPHY N/A 08/05/2017   Procedure: RIGHT/LEFT HEART CATH AND CORONARY ANGIOGRAPHY;  Surgeon: Belva Crome, MD;  Location: [facility];  Service: Cardiovascular;  Laterality: N/A;  . TEE WITHOUT CARDIOVERSION N/A 10/21/2017   Procedure: TRANSESOPHAGEAL ECHOCARDIOGRAM (TEE);  Surgeon: Rexene Alberts, MD;  Location: [facility];  Service: Open Heart Surgery;  Laterality: N/A;  . TONSILLECTOMY      Current Outpatient Medications  Medication Sig Dispense Refill  . acetaminophen (TYLENOL) 500 MG tablet Take 2 tablets (1,000 mg  total) by mouth every 6 (six) hours as needed. 30 tablet 0  . bisacodyl (DULCOLAX) 5 MG EC tablet Take 5 mg by mouth daily as needed for moderate constipation.    . diazepam (VALIUM) 2 MG tablet Take 2 mg by mouth 2 (two) times daily as needed for anxiety.     Marland Kitchen FLUoxetine (PROZAC) 40 MG capsule Take 40 mg by mouth daily.     . fluticasone (FLONASE) 50 MCG/ACT nasal spray Place 2 sprays into both nostrils  daily as needed.    Marland Kitchen ibuprofen (ADVIL) 200 MG tablet Take 200mg  by mouth twice a day x 7 days, then followed by 200mg  daily thereafter. (Patient taking differently: daily as needed for headache or mild pain. )  0  . Multiple Vitamin (MULTIVITAMIN WITH MINERALS) TABS tablet Take 1 tablet by mouth daily.    . nitroGLYCERIN (NITROSTAT) 0.4 MG SL tablet Place 1 tablet (0.4 mg total) under the tongue every 5 (five) minutes as needed for chest pain. 30 tablet 0  . omeprazole (PRILOSEC) 20 MG capsule TAKE 1 CAPSULE BY MOUTH EVERY DAY 30 capsule 0  . SUMAtriptan (IMITREX) 50 MG tablet Take 50 mg by mouth every 2 (two) hours as needed for migraine. May repeat in 2 hours if headache persists or recurs.    . traZODone (DESYREL) 50 MG tablet Take 50 mg by mouth at bedtime as needed.     . umeclidinium-vilanterol (ANORO ELLIPTA) 62.5-25 MCG/INH AEPB Inhale 1 puff into the lungs daily. 7 each 0   No current facility-administered medications for this visit.   Allergies:  Iodine, Morphine and related, and Shellfish allergy   Social History: The patient  reports that she quit smoking about 20 months ago. She smoked 0.50 packs per day. She has never used smokeless tobacco. She reports previous alcohol use of about 2.0 - 3.0 standard drinks of alcohol per week. She reports that she does not use drugs.   Family History: The patient's family history includes Hypertension in her mother.   ROS:  Please see the history of present illness. Otherwise, complete review of systems is positive for none.  All other systems are reviewed and negative.   Physical Exam: VS:  BP 104/70   Pulse 74   Ht 5\' 7"  (1.702 m)   Wt 177 lb 3.2 oz (80.4 kg)   SpO2 98%   BMI 27.75 kg/m , BMI Body mass index is 27.75 kg/m.  Wt Readings from Last 3 Encounters:  04/26/19 177 lb 3.2 oz (80.4 kg)  10/25/18 185 lb (83.9 kg)  10/04/18 185 lb 9.6 oz (84.2 kg)    General: Patient appears comfortable at rest. Neck: Supple, no elevated JVP  or carotid bruits, no thyromegaly. Lungs: Clear to auscultation, nonlabored breathing at rest. Cardiac: Regular rate and rhythm, no S3 or significant systolic murmur, no pericardial rub. Extremities: No pitting edema, distal pulses 2+. Skin: Warm and dry. Musculoskeletal: No kyphosis. Neuropsychiatric: Alert and oriented x3, affect grossly appropriate.  ECG:  An ECG dated 04/26/2019 was personally reviewed today and demonstrated:  Normal sinus rhythm rate of 73, no acute ST or T wave abnormalities noted, normal axis,  Recent Labwork: 10/04/2018: Hemoglobin 13.2; Platelets 153.0  No results found for: CHOL, TRIG, HDL, CHOLHDL, VLDL, LDLCALC, LDLDIRECT  Other Studies Reviewed Today:  CT Chest without contrast 11/10/2018 ordered by Pulmonology IMPRESSION: 1. No definite findings to suggest interstitial lung disease. 2. Widespread areas of linear scarring, similar to the prior examination, presumably related to  sequela of remote infection or inflammation. 3. Aortic atherosclerosis. 4. Small hiatal hernia. Aortic Atherosclerosis (ICD10-I70.0).  Coronary CT: 09/2017 IMPRESSION: 1. 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 Anomalous RSPV draining into the SVC 3. Severe RA/RV enlargement 4. Normal right dominant coronary arteries with no calcium seen better on CT 08/26/17  Cardiac Catheterization: 07/2017  Normal coronary arteries.  Normal LV size and function.  Normal pulmonary artery pressures.  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   Qp/Qs greater than 2.7:1 (using IVC O2 sat as mixed venous in calculation).   Echocardiogram 05/20/2018: 1. The left ventricle has normal systolic function, with an ejection fraction of 55-60%. The cavity size was normal. There is mild concentric left ventricular hypertrophy.  Left ventricular diastolic parameters were normal. No evidence of left ventricular regional wall motion abnormalities. 2. The right ventricle has mildly reduced systolic function. The cavity was mildly enlarged. There is no increase in right ventricular wall thickness. 3. 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 No significant residual shunt noted. 4. The mitral valve is grossly normal. 5. The tricuspid valve is grossly normal. 6. The aortic valve is grossly normal. 7. No pericardial effusion.   Assessment and Plan:  1. Dyspnea on exertion   2. Chest pain, unspecified type   3. Congenital heart disease   4. Hx of smoking    1. Dyspnea on exertion Continues with dyspnea on exertion.  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  She also admits to some resting dyspnea on occasion.  Please schedule a PFT.  She was ordered a PFT study by her pulmonary provider, however she states she was not aware of a pulmonary function test being ordered and no one ever called her.  She does have a history of smoking 1/2 pack/day.  She has ceased smoking.  2. Chest pain, unspecified type Patient had a Coronary CTA scheduled in March 2020 but has been deferred so far d/t Covid restrictions. Patient states no one has followed up to schedule her for a CT angiogram which was ordered previously. She continues with chest pain/pressure which radiates through to her back aggravated by exertional activity and relieved with rest.  She also states she has chest tenderness  3. 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 No residual shunt noted on echo 04/2018  4. Hx of smoking Hx of smoking 1/2 PPD cigarettes. Quit around 2 years ago. PFTs  were ordered  By Dr Vaughan Browner  Pulmonology but not yet performed.  Please schedule PFTs.  Medication Adjustments/Labs and Tests Ordered: Current medicines are reviewed at length with the patient today.  Concerns regarding medicines are outlined above.   Disposition: Follow-up with Dr Bronson Ing or APP 3 months  Signed, Levell July, NP 04/26/2019 10:00 AM    [facility] [place], [place], [place] [address] Phone: [phone]; Fax: [phone]

## 2019-04-26 ENCOUNTER — Telehealth: Payer: Self-pay | Admitting: Family Medicine

## 2019-04-26 ENCOUNTER — Ambulatory Visit: Payer: BC Managed Care – PPO | Admitting: Family Medicine

## 2019-04-26 ENCOUNTER — Encounter: Payer: Self-pay | Admitting: Family Medicine

## 2019-04-26 ENCOUNTER — Telehealth: Payer: Self-pay | Admitting: *Deleted

## 2019-04-26 ENCOUNTER — Other Ambulatory Visit: Payer: Self-pay

## 2019-04-26 VITALS — BP 104/70 | HR 74 | Ht 67.0 in | Wt 177.2 lb

## 2019-04-26 DIAGNOSIS — R079 Chest pain, unspecified: Secondary | ICD-10-CM

## 2019-04-26 DIAGNOSIS — Q249 Congenital malformation of heart, unspecified: Secondary | ICD-10-CM | POA: Diagnosis not present

## 2019-04-26 DIAGNOSIS — R0600 Dyspnea, unspecified: Secondary | ICD-10-CM | POA: Diagnosis not present

## 2019-04-26 DIAGNOSIS — Z87891 Personal history of nicotine dependence: Secondary | ICD-10-CM | POA: Diagnosis not present

## 2019-04-26 DIAGNOSIS — R0609 Other forms of dyspnea: Secondary | ICD-10-CM

## 2019-04-26 DIAGNOSIS — R072 Precordial pain: Secondary | ICD-10-CM

## 2019-04-26 MED ORDER — PREDNISONE 50 MG PO TABS: 50.0000 mg | ORAL_TABLET | ORAL | 0 refills | Status: DC

## 2019-04-26 MED ORDER — METOPROLOL TARTRATE 100 MG PO TABS: 100.0000 mg | ORAL_TABLET | Freq: Once | ORAL | 0 refills | Status: DC

## 2019-04-26 NOTE — Patient Instructions (Addendum)
Medication Instructions:   Your physician recommends that you continue on your current medications as directed. Please refer to the Current Medication list given to you today.  Labwork:  NONE  Testing/Procedures: Your physician has recommended that you have a pulmonary function test. Pulmonary Function Tests are a group of tests that measure how well air moves in and out of your lungs.  We will reschedule your cardiac cta.  Follow-Up:  Your physician recommends that you schedule a follow-up appointment in: 3 months (office).  Any Other Special Instructions Will Be Listed Below (If Applicable).  If you need a refill on your cardiac medications before your next appointment, please call your pharmacy.  Your cardiac CT will be scheduled at one of the below locations:   [facility] [address] [place], [place] [address] [phone]   If scheduled at [facility], please arrive at the [facility] main entrance of [facility] 30 minutes prior to test start time. Proceed to the [facility] Radiology Department (first floor) to check-in and test prep.  Please follow these instructions carefully (unless otherwise directed):   On the Night Before the Test: . Be sure to Drink plenty of water. . Do not consume any caffeinated/decaffeinated beverages or chocolate 12 hours prior to your test. . Do not take any antihistamines 12 hours prior to your test. . If the patient has contrast allergy: ? Patient will need a prescription for Prednisone and very clear instructions (as follows): 1. Prednisone 50 mg - take 13 hours prior to test 2. Take another Prednisone 50 mg 7 hours prior to test 3. Take another Prednisone 50 mg 1 hour prior to test 4. Take Benadryl 50 mg 1 hour prior to test . Patient must complete all four doses of above prophylactic medications. . Patient will need a ride after test due to Benadryl.  On the Day of the Test: . Drink plenty of  water. Do not drink any water within one hour of the test. . Do not eat any food 4 hours prior to the test. . You may take your regular medications prior to the test.  . Take metoprolol (Lopressor) 100 mg two hours prior to test. . FEMALES- please wear underwire-free bra if available        After the Test: . Drink plenty of water. . After receiving IV contrast, you may experience a mild flushed feeling. This is normal. . On occasion, you may experience a mild rash up to 24 hours after the test. This is not dangerous. If this occurs, you can take Benadryl 25 mg and increase your fluid intake. . If you experience trouble breathing, this can be serious. If it is severe call 911 IMMEDIATELY. If it is mild, please call our office.   Once we have confirmed authorization from your insurance company, we will call you to set up a date and time for your test.   For non-scheduling related questions, please contact the cardiac imaging nurse navigator should you have any questions/concerns: Marchia Bond, RN Navigator Cardiac Imaging [facility] [phone] office  For scheduling needs, including cancellations and rescheduling, please call [phone].

## 2019-04-26 NOTE — Telephone Encounter (Signed)
  Precert needed for:  Full PFT's dx: DOE  Location: [facility]   Date: May 03, 2019 at 10:00 arrive 9:45

## 2019-04-26 NOTE — Telephone Encounter (Signed)
-----   Message from Weston Anna sent at 04/26/2019 12:09 PM EDT ----- Regarding: PFT Patient has been scheduled for her PFT for May 03, 2019  She will need to arrive at [facility] at 9:45.  She will need to have her Covid test the Friday before.   Please let me know if I need to call patient to notify of PFT or if you can when you let her know about Covid test

## 2019-04-26 NOTE — Telephone Encounter (Signed)
Patient informed and verbalized understanding of plan. 

## 2019-04-29 ENCOUNTER — Other Ambulatory Visit (HOSPITAL_COMMUNITY): Payer: 59

## 2019-04-29 ENCOUNTER — Other Ambulatory Visit (HOSPITAL_COMMUNITY)
Admission: RE | Admit: 2019-04-29 | Discharge: 2019-04-29 | Disposition: A | Discharge status: Home / Self Care | Payer: 59 | Source: Ambulatory Visit | Attending: Family Medicine | Admitting: Family Medicine

## 2019-04-29 ENCOUNTER — Other Ambulatory Visit: Payer: Self-pay

## 2019-04-29 DIAGNOSIS — Z20822 Contact with and (suspected) exposure to covid-19: Secondary | ICD-10-CM | POA: Diagnosis not present

## 2019-04-29 DIAGNOSIS — Z01812 Encounter for preprocedural laboratory examination: Secondary | ICD-10-CM | POA: Insufficient documentation

## 2019-04-30 LAB — SARS CORONAVIRUS 2 (TAT 6-24 HRS): SARS Coronavirus 2: NEGATIVE

## 2019-05-02 ENCOUNTER — Other Ambulatory Visit (HOSPITAL_COMMUNITY): Payer: 59

## 2019-05-03 ENCOUNTER — Other Ambulatory Visit: Payer: Self-pay

## 2019-05-03 ENCOUNTER — Ambulatory Visit (HOSPITAL_COMMUNITY)
Admission: RE | Admit: 2019-05-03 | Discharge: 2019-05-03 | Disposition: A | Discharge status: Home / Self Care | Payer: 59 | Source: Ambulatory Visit | Attending: Family Medicine | Admitting: Family Medicine

## 2019-05-03 DIAGNOSIS — R0609 Other forms of dyspnea: Secondary | ICD-10-CM

## 2019-05-03 DIAGNOSIS — R0600 Dyspnea, unspecified: Secondary | ICD-10-CM | POA: Insufficient documentation

## 2019-05-03 LAB — PULMONARY FUNCTION TEST
DL/VA % pred: 92 %
DL/VA: 3.86 ml/min/mmHg/L
DLCO unc % pred: 96 %
DLCO unc: 20.98 ml/min/mmHg
FEF 25-75 Post: 3.4 L/sec
FEF 25-75 Pre: 2.12 L/sec
FEF2575-%Change-Post: 60 %
FEF2575-%Pred-Post: 133 %
FEF2575-%Pred-Pre: 82 %
FEV1-%Change-Post: 11 %
FEV1-%Pred-Post: 93 %
FEV1-%Pred-Pre: 83 %
FEV1-Post: 2.62 L
FEV1-Pre: 2.34 L
FEV1FVC-%Change-Post: 5 %
FEV1FVC-%Pred-Pre: 100 %
FEV6-%Change-Post: 5 %
FEV6-%Pred-Post: 90 %
FEV6-%Pred-Pre: 85 %
FEV6-Post: 3.16 L
FEV6-Pre: 2.98 L
FEV6FVC-%Change-Post: 0 %
FEV6FVC-%Pred-Post: 103 %
FEV6FVC-%Pred-Pre: 103 %
FVC-%Change-Post: 6 %
FVC-%Pred-Post: 87 %
FVC-%Pred-Pre: 82 %
FVC-Post: 3.17 L
FVC-Pre: 2.98 L
Post FEV1/FVC ratio: 83 %
Post FEV6/FVC ratio: 100 %
Pre FEV1/FVC ratio: 78 %
Pre FEV6/FVC Ratio: 100 %
RV % pred: 127 %
RV: 2.62 L
TLC % pred: 111 %
TLC: 5.99 L

## 2019-05-03 MED ORDER — ALBUTEROL SULFATE (2.5 MG/3ML) 0.083% IN NEBU: 2.5000 mg | INHALATION_SOLUTION | Freq: Once | RESPIRATORY_TRACT | Status: DC

## 2019-05-05 ENCOUNTER — Telehealth: Payer: Self-pay | Admitting: *Deleted

## 2019-05-05 NOTE — Telephone Encounter (Signed)
-----   Message from Verta Ellen., NP sent at 05/04/2019  9:35 AM EDT ----- Please call the patient and tell her the PFT study states she has minimal obstructive airway disease according to the report. Tell her this may account for some of her shortness of breath but is not a major contributor.

## 2019-05-05 NOTE — Telephone Encounter (Signed)
Patient informed. Copy sent to PCP °

## 2019-05-26 ENCOUNTER — Telehealth: Payer: Self-pay | Admitting: *Deleted

## 2019-05-26 NOTE — Telephone Encounter (Signed)
-----   Message from Chanda Busing sent at 05/26/2019  9:52 AM EDT ----- Regarding: RE: CT CORONARY Patient just called stating that she received letter of denial from [facility]. States that she called and spoke with someone on 05/25/2019 . They advised her to call our office and call the following # [phone] option # 4 please use reference # [id number]. ----- Message ----- From: Ciro Backer Sent: 05/25/2019   5:40 PM EDT To: Chanda Busing Subject: RE: [place],  The request was denied and we are waiting on the denial letter for the reasoning. Chances are it's not a covered benefit with her insurance. [facility] will not let you know over the phone, they tell you to wait for the letter. That's where we are right now.  Fatima Sanger ----- Message ----- From: Delfino Lovett T Sent: 05/25/2019  12:37 PM EDT To: Ciro Backer Subject: FW: CT CORONARY                                Just checking to see if you by chance have any updates on this patient's percert.  ----- Message ----- From: Howie Ill Sent: 05/25/2019  12:28 PM EDT To: Chanda Busing Subject: RE: CT CORONARY                                Vicky  This is the response I received from [place] [place] [facility] PENDING [id number] [facility] )  Ciro Backer 05/12/2019 1:57 PM FAX FORM TO [facility]  I will let you know when I get a update from them again.  Thank you  Jannet Askew ----- Message ----- From: Delfino Lovett T Sent: 05/25/2019  12:08 PM EDT To: Massie Maroon, CMA, Howie Ill Subject: CT CORONARY                                    Hello Constance Holster,  Checking the status of patient's CT Coronary. Is her insurance still pending?  Thank you for your help.  Vicky

## 2019-05-26 NOTE — Telephone Encounter (Signed)
Try "congenital heart disease". A stress test will not show what we're looking for, anatomically speaking.

## 2019-05-26 NOTE — Telephone Encounter (Signed)
Spoke with [facility] and denial was from insurance requesting she have stress test rather than coronary CT for chest pain/SOB - we can do reconsideration appeal if we have additional reasoning for CT vs stress test - will forward to provider if he could add any additional info or do stress test

## 2019-05-30 ENCOUNTER — Encounter: Payer: Self-pay | Admitting: *Deleted

## 2019-05-30 NOTE — Telephone Encounter (Signed)
Sent letter for reconsideration via Epic to [facility]

## 2019-06-28 ENCOUNTER — Telehealth (HOSPITAL_COMMUNITY): Payer: Self-pay | Admitting: *Deleted

## 2019-06-28 NOTE — Telephone Encounter (Signed)
Reaching out to patient to offer assistance regarding upcoming cardiac imaging study; pt verbalizes understanding of appt date/time, parking situation and where to check in, pre-test NPO status and medications ordered, and verified current allergies; name and call back number provided for further questions should they arise  [facility] [phone] office [phone] cell  Pt verbalized understanding of 13 hour prep.

## 2019-06-30 ENCOUNTER — Ambulatory Visit (HOSPITAL_COMMUNITY)
Admission: RE | Admit: 2019-06-30 | Discharge: 2019-06-30 | Disposition: A | Discharge status: Home / Self Care | Payer: 59 | Source: Ambulatory Visit | Attending: Family Medicine | Admitting: Family Medicine

## 2019-06-30 ENCOUNTER — Other Ambulatory Visit: Payer: Self-pay

## 2019-06-30 DIAGNOSIS — Z006 Encounter for examination for normal comparison and control in clinical research program: Secondary | ICD-10-CM

## 2019-06-30 DIAGNOSIS — R072 Precordial pain: Secondary | ICD-10-CM | POA: Insufficient documentation

## 2019-06-30 MED ORDER — NITROGLYCERIN 0.4 MG SL SUBL
SUBLINGUAL_TABLET | SUBLINGUAL | Status: AC
  Filled 2019-06-30: qty 2

## 2019-06-30 MED ORDER — NITROGLYCERIN 0.4 MG SL SUBL: 0.8000 mg | SUBLINGUAL_TABLET | Freq: Once | SUBLINGUAL | Status: DC

## 2019-06-30 MED ORDER — IOHEXOL 350 MG/ML SOLN
80.0000 mL | Freq: Once | INTRAVENOUS | Status: AC | PRN
  Administered 2019-06-30: 80 mL via INTRAVENOUS

## 2019-06-30 NOTE — Research (Signed)
Cadfem Informed Consent    Patient Name: Kristy Lewis   Subject met inclusion and exclusion criteria.  The informed consent form, study requirements and expectations were reviewed with the subject and questions and concerns were addressed prior to the signing of the consent form.  The subject verbalized understanding of the trail requirements.  The subject agreed to participate in the CADFEM trial and signed the informed consent.  The informed consent was obtained prior to performance of any protocol-specific procedures for the subject.  A copy of the signed informed consent was given to the subject and a copy was placed in the subject's medical record.   [name]

## 2019-07-04 ENCOUNTER — Telehealth: Payer: Self-pay | Admitting: *Deleted

## 2019-07-04 NOTE — Telephone Encounter (Signed)
Patient informed. Copy sent to PCP °

## 2019-07-04 NOTE — Telephone Encounter (Signed)
-----   Message from Verta Ellen., NP sent at 06/30/2019  8:17 PM EDT ----- Please call the patient and let her know the coronary CT showed no evidence of coronary artery disease. Her coronary calcium score was 0. This means there is no evidence of plaque accumulation in her arteries to cause chest pain or SOB. It appears the heart is not the cause of her exertional dyspnea. I would suggest she see a pulmonologist given her history of smoking. If she is agrees, please refer her to [facility]. Thank You

## 2019-07-27 ENCOUNTER — Ambulatory Visit: Payer: 59 | Admitting: Cardiovascular Disease

## 2019-08-02 IMAGING — MG DIGITAL SCREENING BILATERAL MAMMOGRAM WITH CAD
4 series · 4 of 4 positions shown · non-contrast
Comparison: Previous exam(s).

CLINICAL DATA: Screening.

EXAM:
DIGITAL SCREENING BILATERAL MAMMOGRAM WITH CAD

[R MLO]
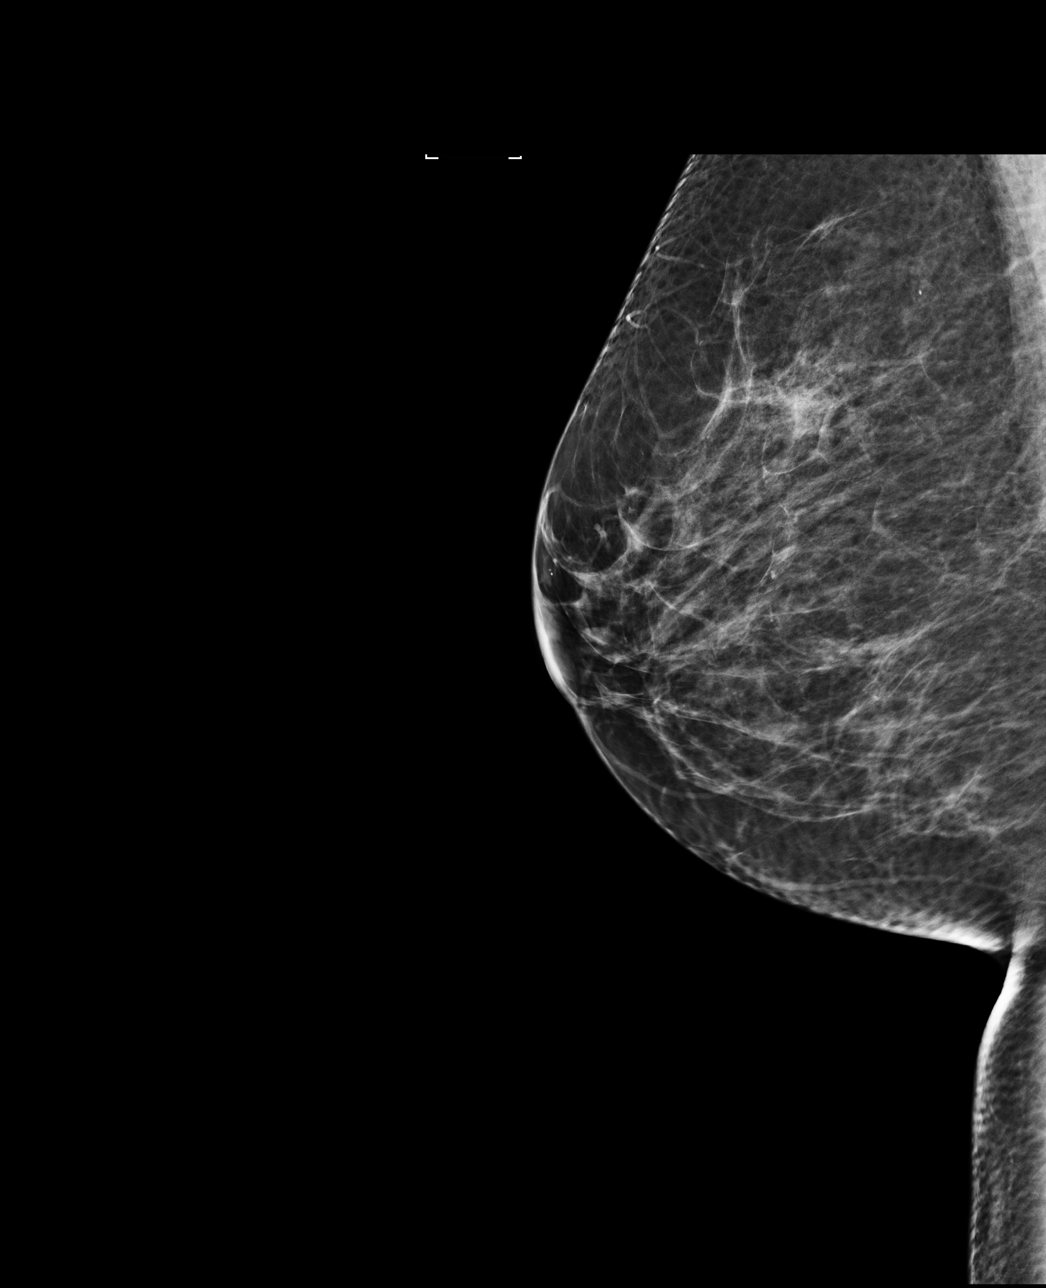

[L CC]
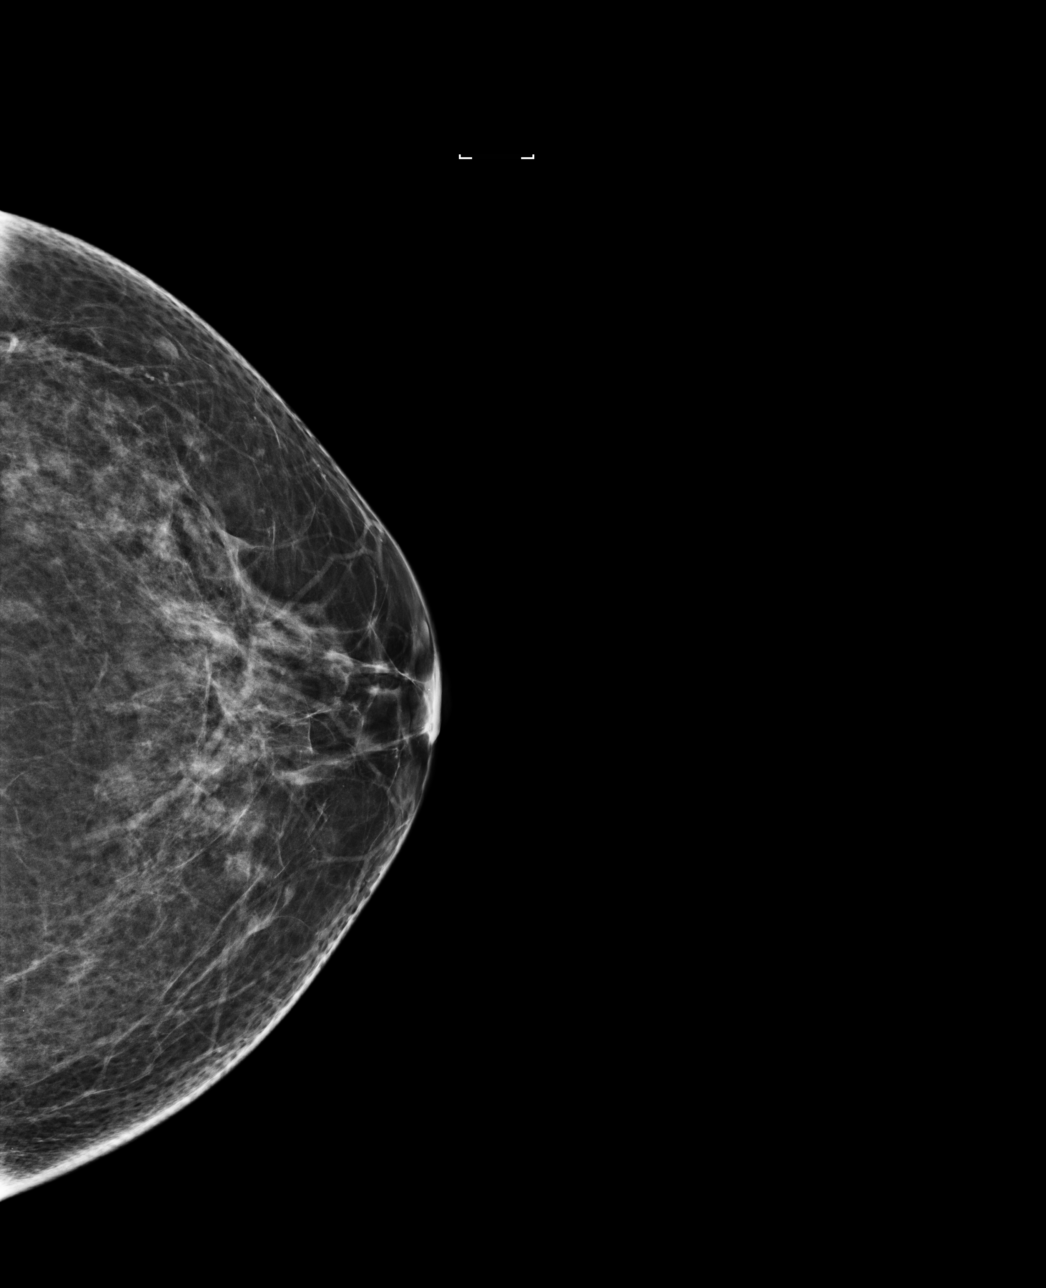

[R CC]
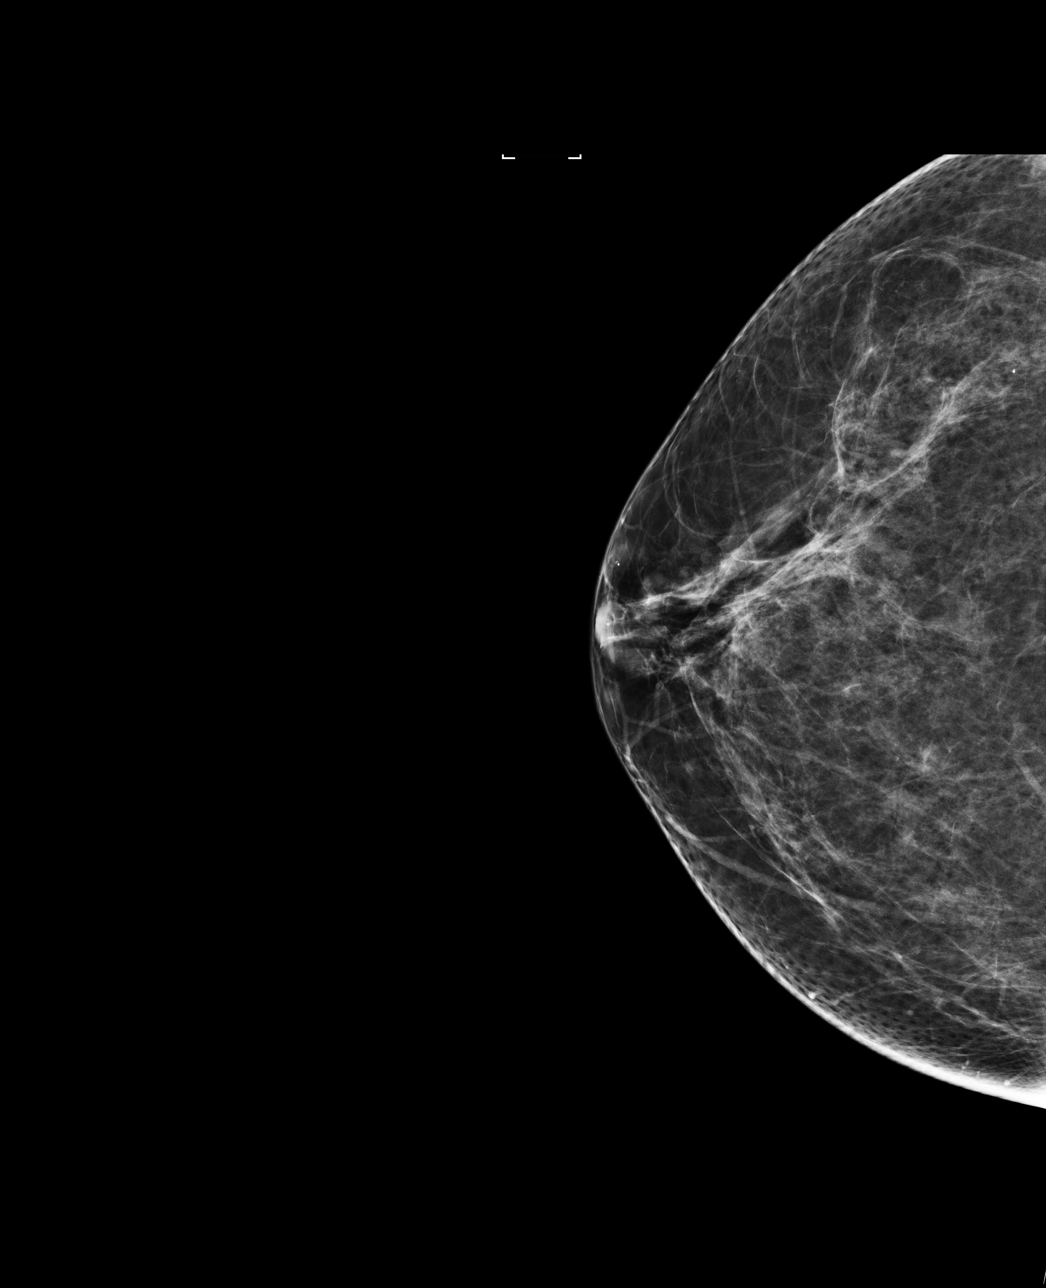

[L MLO]
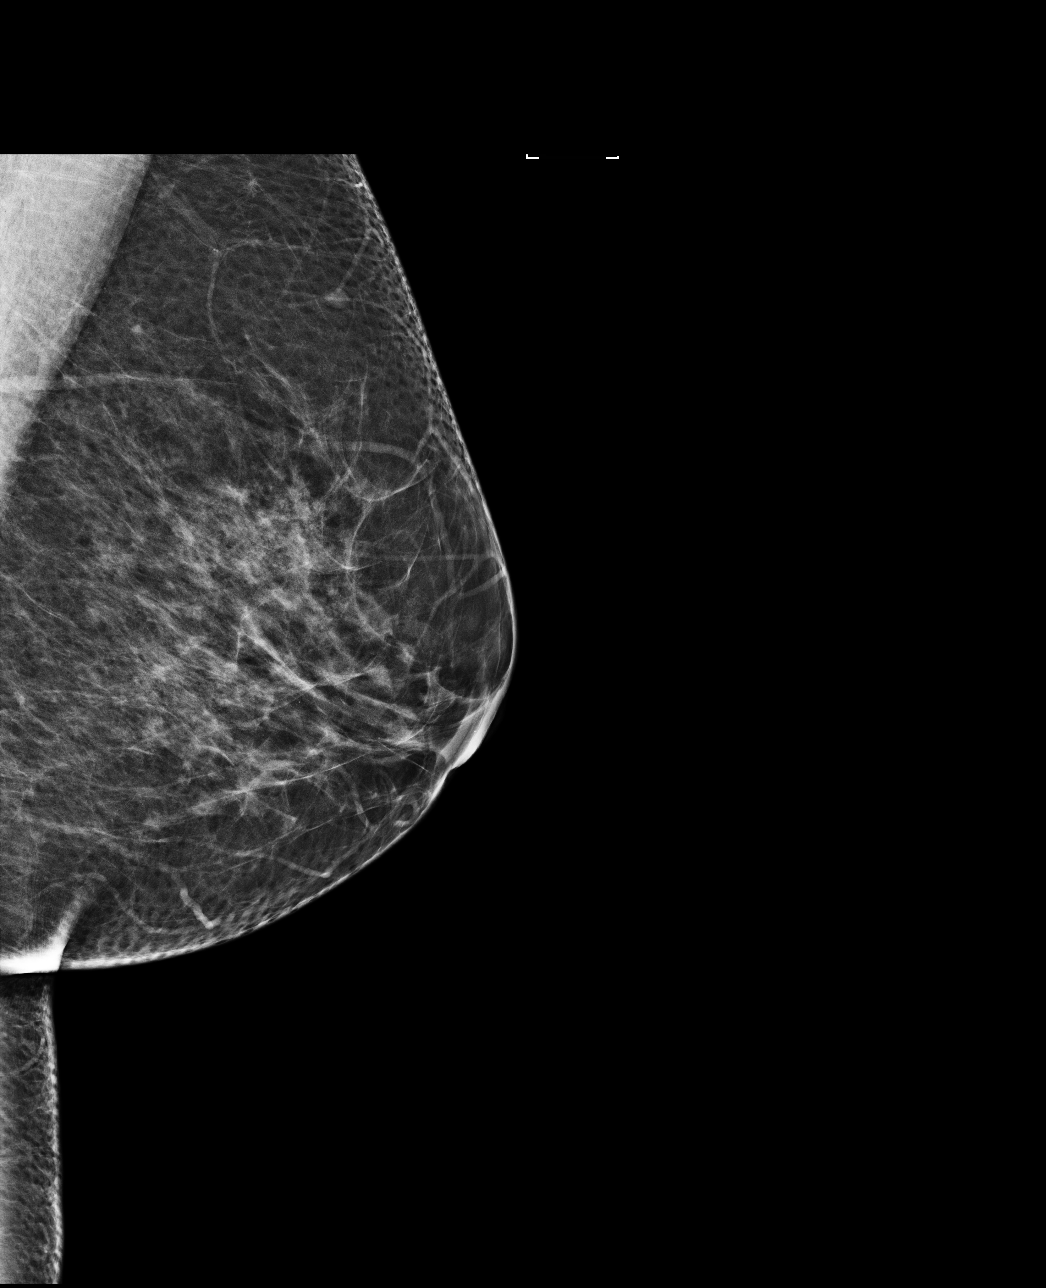

[4 of 4 positions shown; findings below may reference images not displayed]

ACR Breast Density Category b: There are scattered areas of
fibroglandular density.
FINDINGS: There are no findings suspicious for malignancy. Images were
processed with CAD.
IMPRESSION: No mammographic evidence of malignancy. A result letter of this
screening mammogram will be mailed directly to the patient.

RECOMMENDATION:
Screening mammogram in one year. (Code:AS-G-LCT)

BI-RADS CATEGORY  1: Negative.

## 2020-02-25 IMAGING — DX DG CHEST 2V
2 series · 2 of 2 positions shown · non-contrast
Comparison: 10/01/2016 chest radiograph.

CLINICAL DATA: Chest pain

EXAM:
CHEST - 2 VIEW

[chest pa]
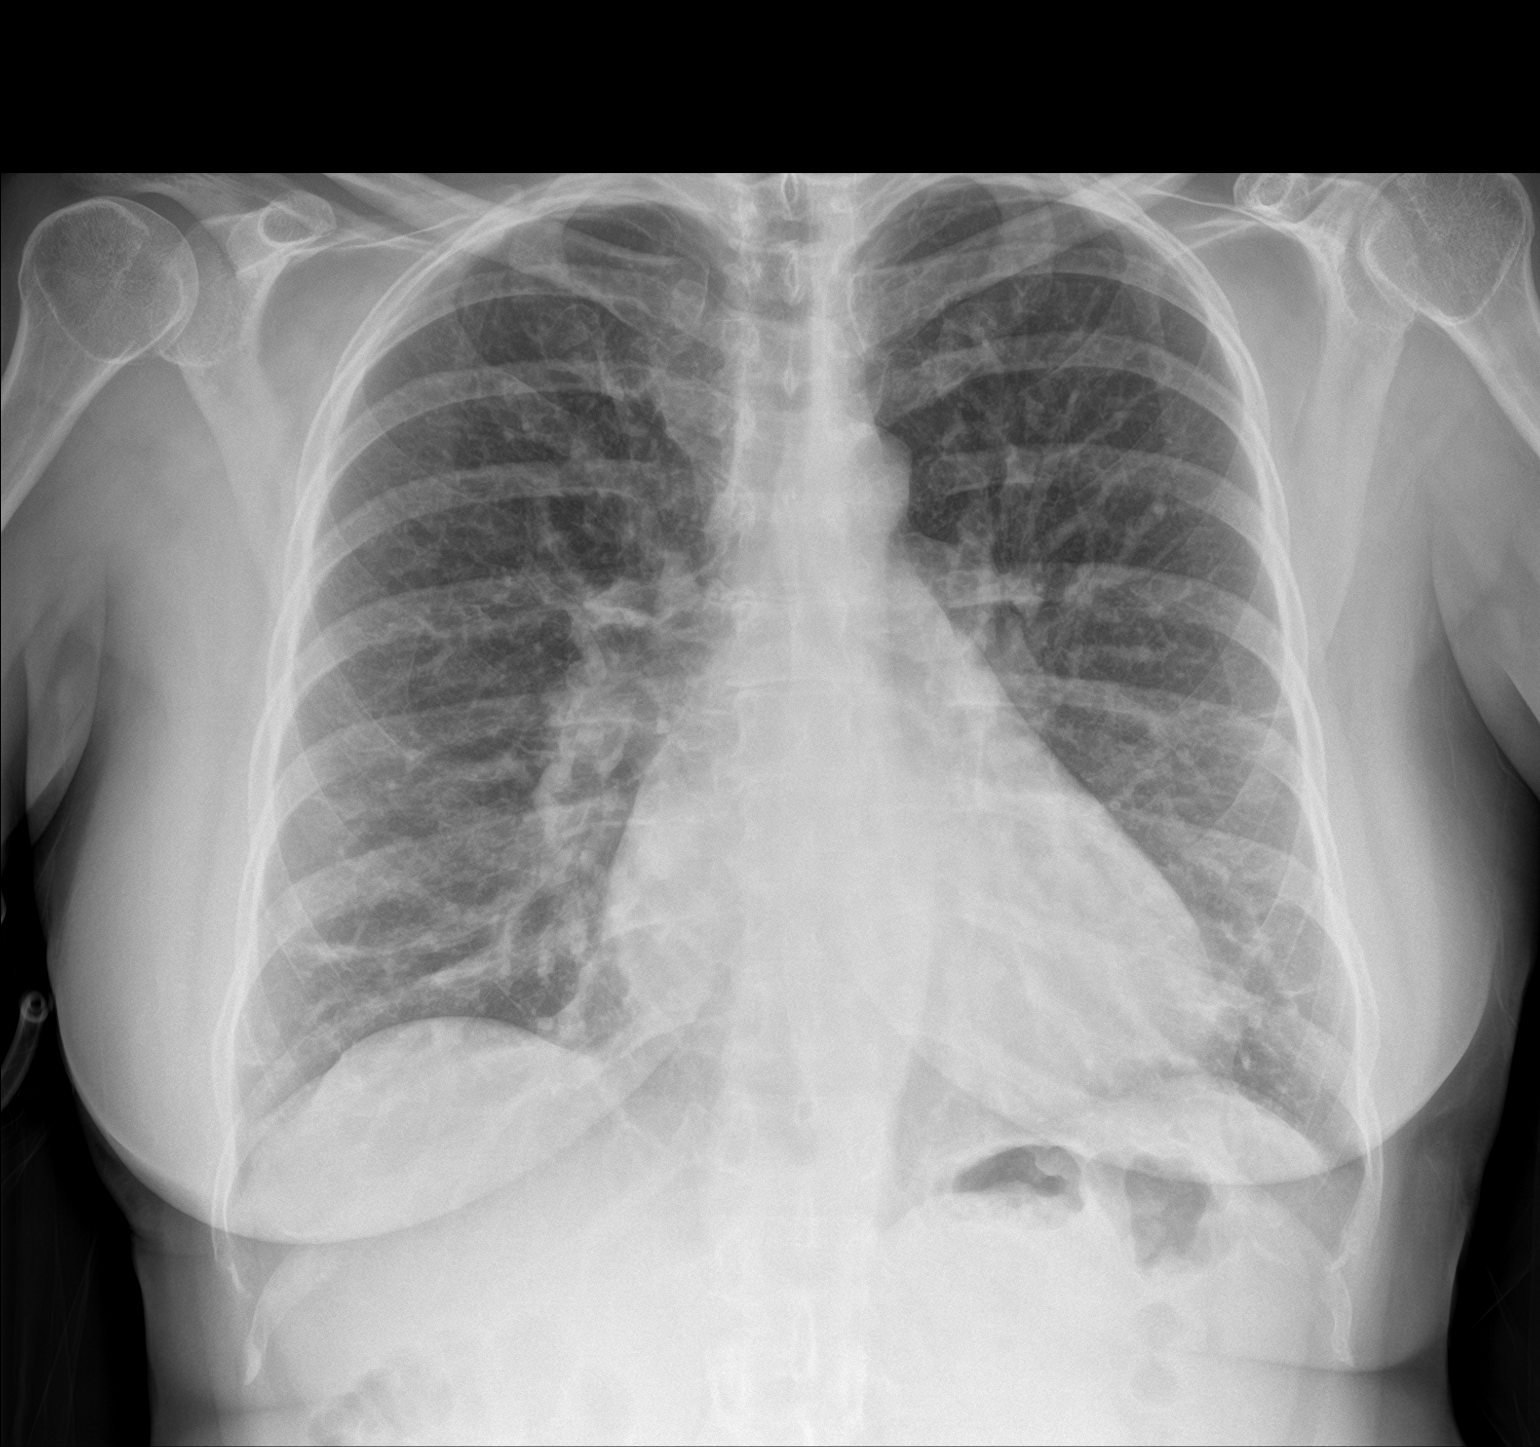

[chest lat]
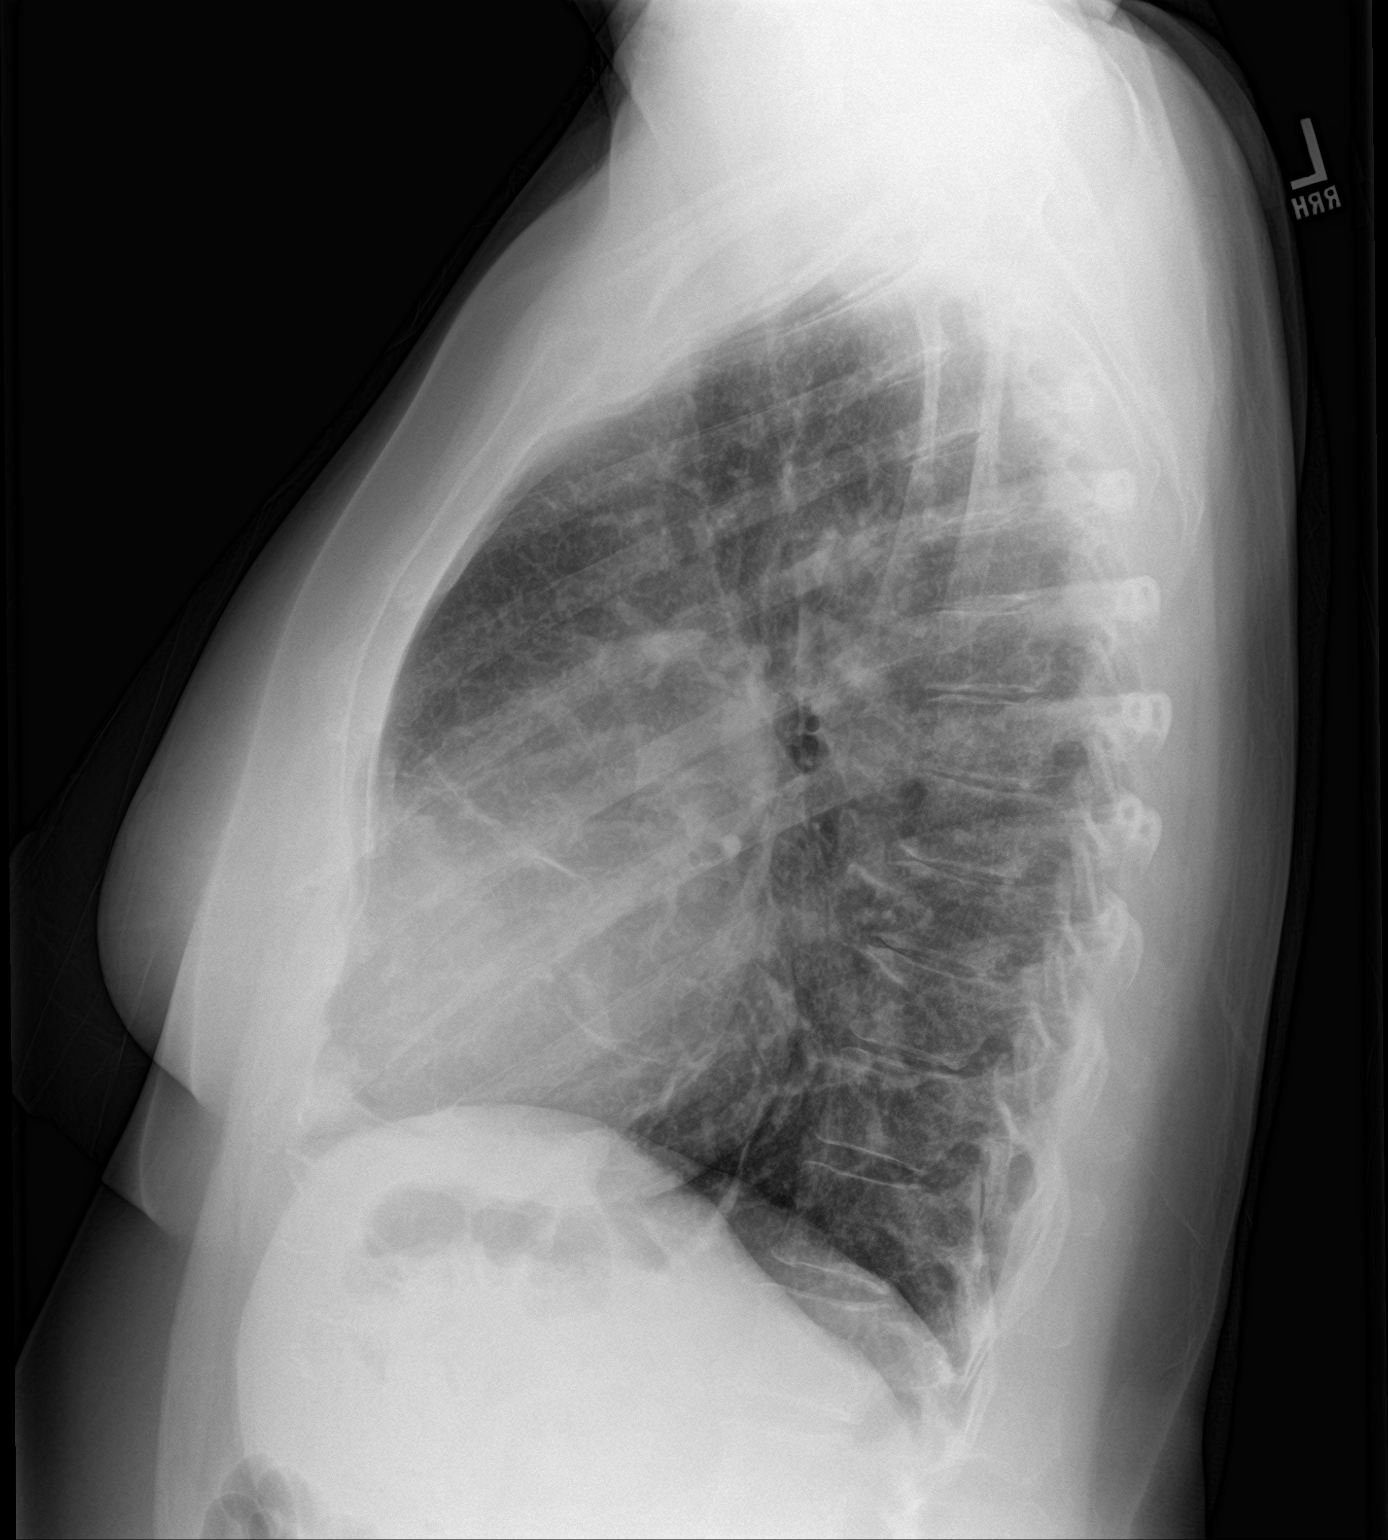

[2 of 2 positions shown; findings below may reference images not displayed]

FINDINGS: Stable cardiomediastinal silhouette with borderline mild
cardiomegaly. No pneumothorax. No pleural effusion. No overt
pulmonary edema. No acute consolidative airspace disease. Stable
minimal scarring versus atelectasis in the anterior lower lungs.
IMPRESSION: Stable minimal scarring versus atelectasis in the anterior lower
lungs. Stable borderline mild cardiomegaly without overt pulmonary
edema.

## 2020-03-07 ENCOUNTER — Ambulatory Visit (INDEPENDENT_AMBULATORY_CARE_PROVIDER_SITE_OTHER): Payer: 59 | Admitting: Cardiology

## 2020-03-07 ENCOUNTER — Encounter: Payer: Self-pay | Admitting: Cardiology

## 2020-03-07 VITALS — BP 138/90 | HR 68 | Ht 67.0 in | Wt 176.0 lb

## 2020-03-07 DIAGNOSIS — R0600 Dyspnea, unspecified: Secondary | ICD-10-CM | POA: Diagnosis not present

## 2020-03-07 DIAGNOSIS — R0609 Other forms of dyspnea: Secondary | ICD-10-CM

## 2020-03-07 DIAGNOSIS — R0789 Other chest pain: Secondary | ICD-10-CM

## 2020-03-07 MED ORDER — PANTOPRAZOLE SODIUM 40 MG PO TBEC: 40.0000 mg | DELAYED_RELEASE_TABLET | Freq: Every day | ORAL | 1 refills | Status: DC

## 2020-03-07 NOTE — Progress Notes (Signed)
Clinical Summary Kristy Lewis is a 60 y.o.female seen today for follow up of the following medical problems.   1. Chest pain - Cardiac cath 2019 normal coronaries, - 06/2019 coronary CTA: coronary calcium score of 0, no disease. Normal ASD repair without shunting  - intermittent midchest/epigastric pain, mainyl with standing at work;  2. DOE 07/2017 cath (prior to cardiac surgery) mean PA 22, PCWP 17 - 04/2018 echo LVEF 69-48%, normal diastolic fxn, mild RV dysfunction - 06/2019 coronary CTA: coronary calcium score of 0, no disease. Normal ASD repair without shunting - 04/2019 PFTs minimal obstruction  - chronic stable symptoms  3. 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   SH: works as Emergency planning/management officer Past Medical History:  Diagnosis Date  . Anginal pain ([place])    Comes and goes, pt states its normal to have every day   . ASD (atrial septal defect), sinus venosus defect   . Bipolar disorder ([place])    pt denies  . Cardiac murmur   . Carpal tunnel syndrome   . Depressive disorder   . Dysphagia   . Dyspnea    Upon exertion  . Esophageal reflux   . Genital herpes    at age 69  . Hypercholesteremia   . Migraines   . 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 Urinary incontinence      Allergies  Allergen Reactions  . Iodine Swelling and Rash    SWELLING REACTION UNSPECIFIED   . Morphine And Related Itching and Other (See Comments)    Altered mental status "mean"  . Shellfish Allergy Swelling and Rash    SWELLING REACTION UNSPECIFIED      Current Outpatient Medications  Medication Sig Dispense Refill  . acetaminophen (TYLENOL) 500 MG tablet Take 2 tablets (1,000 mg total) by mouth every 6 (six) hours as needed. 30 tablet 0  . bisacodyl (DULCOLAX) 5 MG EC tablet Take 5 mg by mouth daily as needed for moderate  constipation.    . diazepam (VALIUM) 2 MG tablet Take 2 mg by mouth 2 (two) times daily as needed for anxiety.     Marland Kitchen FLUoxetine (PROZAC) 40 MG capsule Take 40 mg by mouth daily.     . fluticasone (FLONASE) 50 MCG/ACT nasal spray Place 2 sprays into both nostrils daily as needed.    Marland Kitchen ibuprofen (ADVIL) 200 MG tablet Take 200mg  by mouth twice a day x 7 days, then followed by 200mg  daily thereafter. (Patient taking differently: daily as needed for headache or mild pain. )  0  . metoprolol tartrate (LOPRESSOR) 100 MG tablet Take 1 tablet (100 mg total) by mouth once for 1 dose. Take 2 hours before your cardiac cta 1 tablet 0  . Multiple Vitamin (MULTIVITAMIN WITH MINERALS) TABS tablet Take 1 tablet by mouth daily.    . nitroGLYCERIN (NITROSTAT) 0.4 MG SL tablet Place 1 tablet (0.4 mg total) under the tongue every 5 (five) minutes as needed for chest pain. 30 tablet 0  . omeprazole (PRILOSEC) 20 MG capsule TAKE 1 CAPSULE BY MOUTH EVERY DAY 30 capsule 0  . predniSONE (DELTASONE) 50 MG tablet Take 1 tablet (50 mg total) by mouth as directed. Prednisone 50 mg - take 13 hours prior to test Take another Prednisone 50 mg 7 hours prior to test Take another Prednisone 50 mg 1 hour prior to test  3 tablet 0  . SUMAtriptan (IMITREX) 50 MG tablet Take 50 mg by mouth every 2 (two) hours as needed for migraine. May repeat in 2 hours if headache persists or recurs.    . traZODone (DESYREL) 50 MG tablet Take 50 mg by mouth at bedtime as needed.     . umeclidinium-vilanterol (ANORO ELLIPTA) 62.5-25 MCG/INH AEPB Inhale 1 puff into the lungs daily. 7 each 0   No current facility-administered medications for this visit.     Past Surgical History:  Procedure Laterality Date  . ABDOMINAL HYSTERECTOMY     with bladder surgery. Ovaries remained  . ASD REPAIR N/A 10/21/2017   Procedure: MINIMALLY INVASIVE REPAIR OF SINUS VENOSUS ATRIAL SEPTAL DEFECT (ASD), CORRECTION OF PARTIAL ANOMALOUS PULMONARY VENOUS RETURN, CLOSURE  OF PATENT FORAMEN OVALE;  Surgeon: Rexene Alberts, MD;  Location: [facility];  Service: Open Heart Surgery;  Laterality: N/A;  . COLONOSCOPY  10/15/2011   Procedure: COLONOSCOPY;  Surgeon: Rogene Houston, MD;  [facility];  Service: Endoscopy;  Laterality: N/A;  200  . HEMORRHOID SURGERY    . left shoulder manipulation    . RIGHT/LEFT HEART CATH AND CORONARY ANGIOGRAPHY N/A 08/05/2017   Procedure: RIGHT/LEFT HEART CATH AND CORONARY ANGIOGRAPHY;  Surgeon: Belva Crome, MD;  Location: [facility];  Service: Cardiovascular;  Laterality: N/A;  . TEE WITHOUT CARDIOVERSION N/A 10/21/2017   Procedure: TRANSESOPHAGEAL ECHOCARDIOGRAM (TEE);  Surgeon: Rexene Alberts, MD;  Location: [facility];  Service: Open Heart Surgery;  Laterality: N/A;  . TONSILLECTOMY       Allergies  Allergen Reactions  . Iodine Swelling and Rash    SWELLING REACTION UNSPECIFIED   . Morphine And Related Itching and Other (See Comments)    Altered mental status "mean"  . Shellfish Allergy Swelling and Rash    SWELLING REACTION UNSPECIFIED       Family History  Problem Relation Age of Onset  . Hypertension Mother      Social History Ms. Fobes reports that she quit smoking about 2 years ago. She smoked 0.50 packs per day. She has never used smokeless tobacco. Ms. Schlechter reports previous alcohol use of about 2.0 - 3.0 standard drinks of alcohol per week.   Review of Systems CONSTITUTIONAL: No weight loss, fever, chills, weakness or fatigue.  HEENT: Eyes: No visual loss, blurred vision, double vision or yellow sclerae.No hearing loss, sneezing, congestion, runny nose or sore throat.  SKIN: No rash or itching.  CARDIOVASCULAR: per hpi RESPIRATORY: No shortness of breath, cough or sputum.  GASTROINTESTINAL: No anorexia, nausea, vomiting or diarrhea. No abdominal pain or blood.  GENITOURINARY: No burning on urination, no polyuria NEUROLOGICAL: No headache, dizziness, syncope, paralysis, ataxia,  numbness or tingling in the extremities. No change in bowel or bladder control.  MUSCULOSKELETAL: No muscle, back pain, joint pain or stiffness.  LYMPHATICS: No enlarged nodes. No history of splenectomy.  PSYCHIATRIC: No history of depression or anxiety.  ENDOCRINOLOGIC: No reports of sweating, cold or heat intolerance. No polyuria or polydipsia.  Marland Kitchen   Physical Examination Today's Vitals   03/07/20 0821  BP: 138/90  Pulse: 68  SpO2: 98%  Weight: 176 lb (79.8 kg)  Height: 5\' 7"  (1.702 m)   Body mass index is 27.57 kg/m.  Gen: resting comfortably, no acute distress HEENT: no scleral icterus, pupils equal round and reactive, no palptable cervical adenopathy,  CV: RRR, no m/r,g no jvd Resp: Clear to auscultation bilaterally GI: abdomen is soft, non-tender,  non-distended, normal bowel sounds, no hepatosplenomegaly MSK: extremities are warm, no edema.  Skin: warm, no rash Neuro:  no focal deficits Psych: appropriate affect   Diagnostic Studies CT Chest without contrast 11/10/2018 ordered by Pulmonology IMPRESSION: 1. No definite findings to suggest interstitial lung disease. 2. Widespread areas of linear scarring, similar to the prior examination, presumably related to sequela of remote infection or inflammation. 3. Aortic atherosclerosis. 4. Small hiatal hernia. Aortic Atherosclerosis (ICD10-I70.0).  Coronary CT: 09/2017 IMPRESSION: 1. 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 Anomalous RSPV draining into the SVC 3. Severe RA/RV enlargement 4. Normal right dominant coronary arteries with no calcium seen better on CT 08/26/17  Cardiac Catheterization: 07/2017  Normal coronary arteries.  Normal LV size and function.  Normal pulmonary artery pressures.  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    Qp/Qs greater than 2.7:1 (using IVC O2 sat as mixed venous in calculation).   Echocardiogram 05/20/2018: 1. The left ventricle has normal systolic function, with an ejection fraction of 55-60%. The cavity size was normal. There is mild concentric left ventricular hypertrophy. Left ventricular diastolic parameters were normal. No evidence of left ventricular regional wall motion abnormalities. 2. The right ventricle has mildly reduced systolic function. The cavity was mildly enlarged. There is no increase in right ventricular wall thickness. 3. 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 No significant residual shunt noted. 4. The mitral valve is grossly normal. 5. The tricuspid valve is grossly normal. 6. The aortic valve is grossly normal. 7. No pericardial effusion.      Assessment and Plan  1. Chest pain - long history, prior cath and recent coronary CTA have been normal - with epigastric location will try changing her prilosec to protonix and monitor symptoms  2. DOE - chronic stable symptoms, extensive cardiac testing has been benign. Also seen by pulmonary        Arnoldo Lenis, M.D.,

## 2020-03-07 NOTE — Patient Instructions (Signed)
Your physician wants you to follow-up in: [place] will receive a reminder letter in the mail two months in advance. If you don't receive a letter, please call our office to schedule the follow-up appointment.  Your physician has recommended you make the following change in your medication:   STOP PRILOSEC   START PROTONIX 40 MG DAILY   Thank you for choosing [facility]!!

## 2020-06-08 IMAGING — DX DG CHEST 2V
2 series · 2 of 2 positions shown · non-contrast
Comparison: 10/25/2017

CLINICAL DATA: Status post ASD repair.  Intermittent chest pain.

EXAM:
CHEST - 2 VIEW

[dg chest 2 view (1 of 2)]
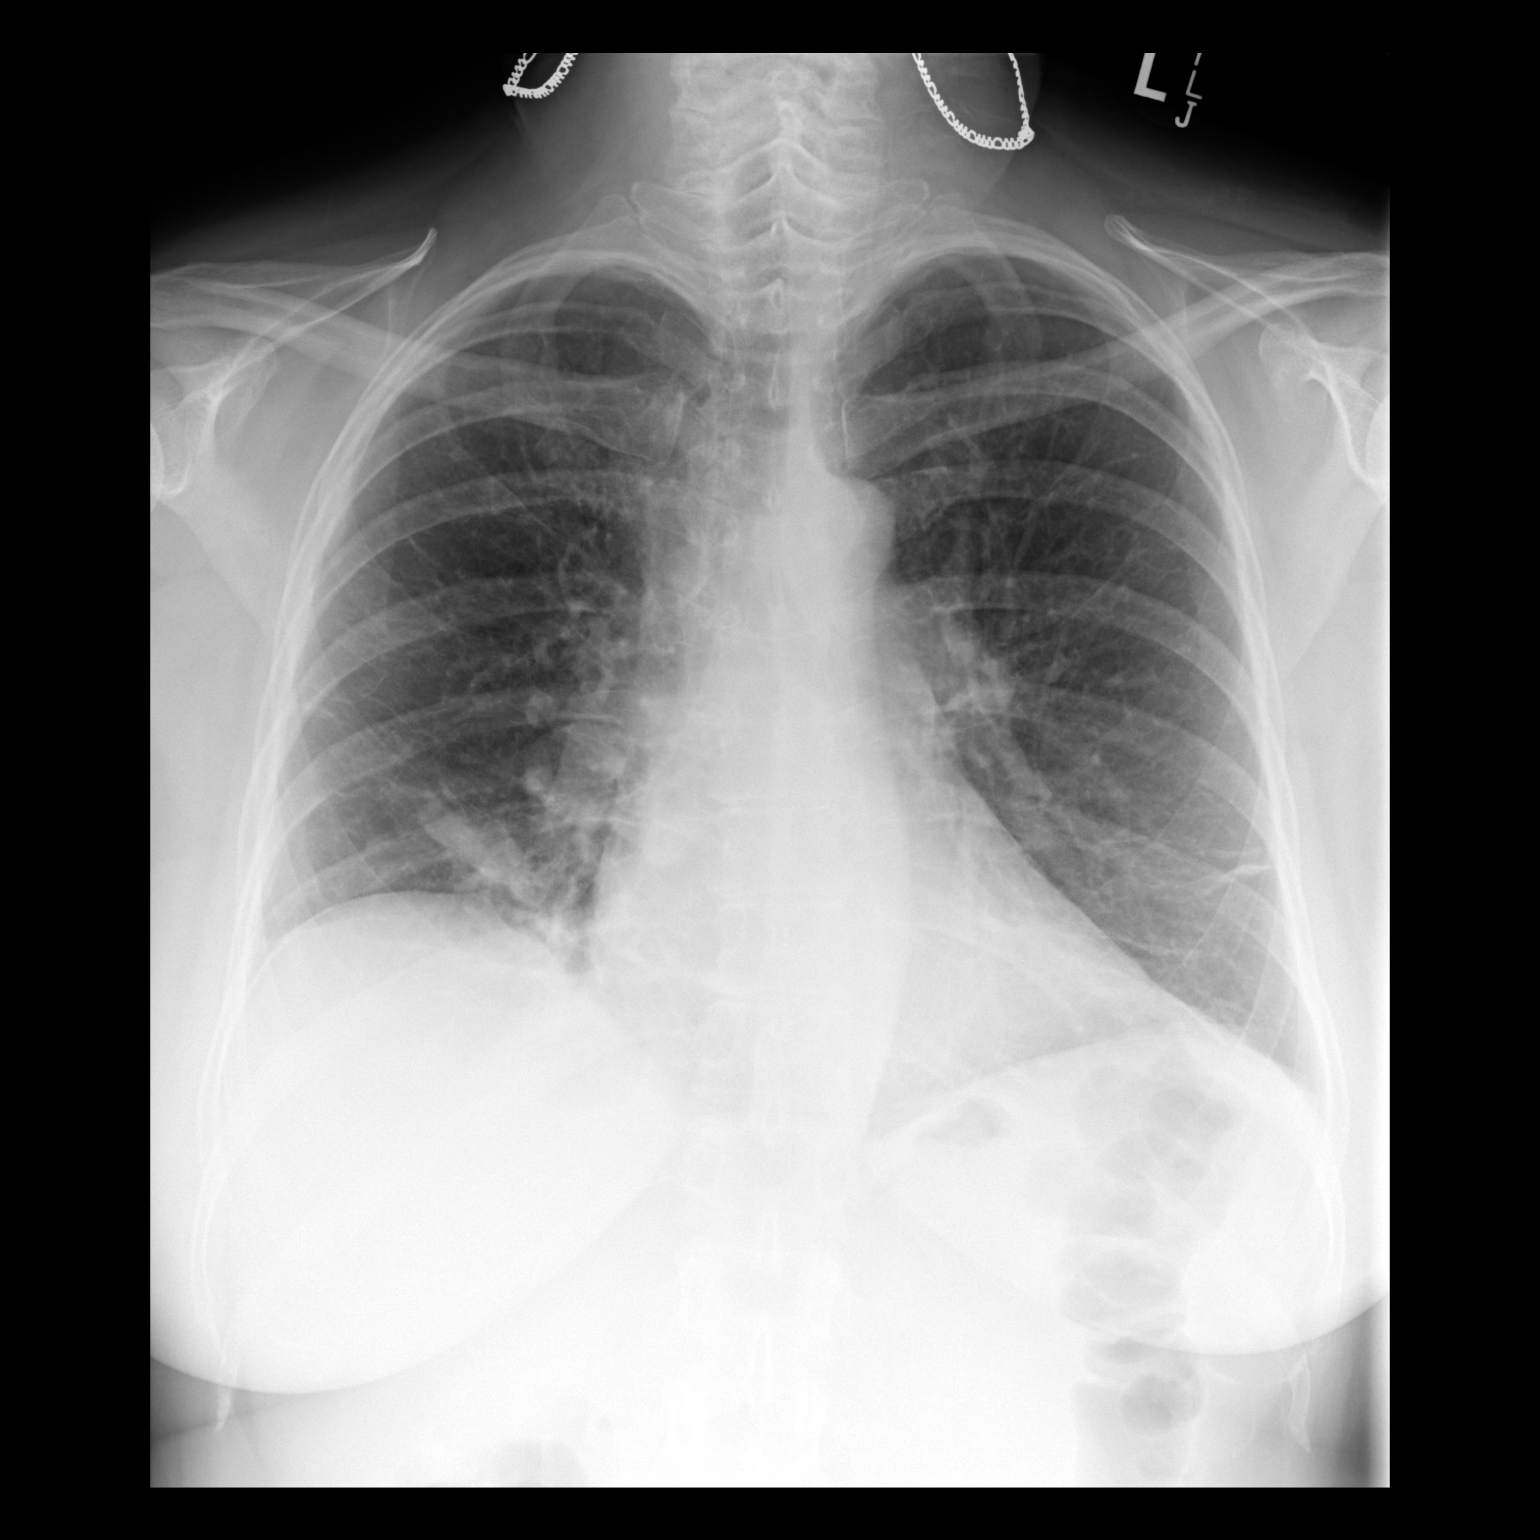

[dg chest 2 view (2 of 2)]
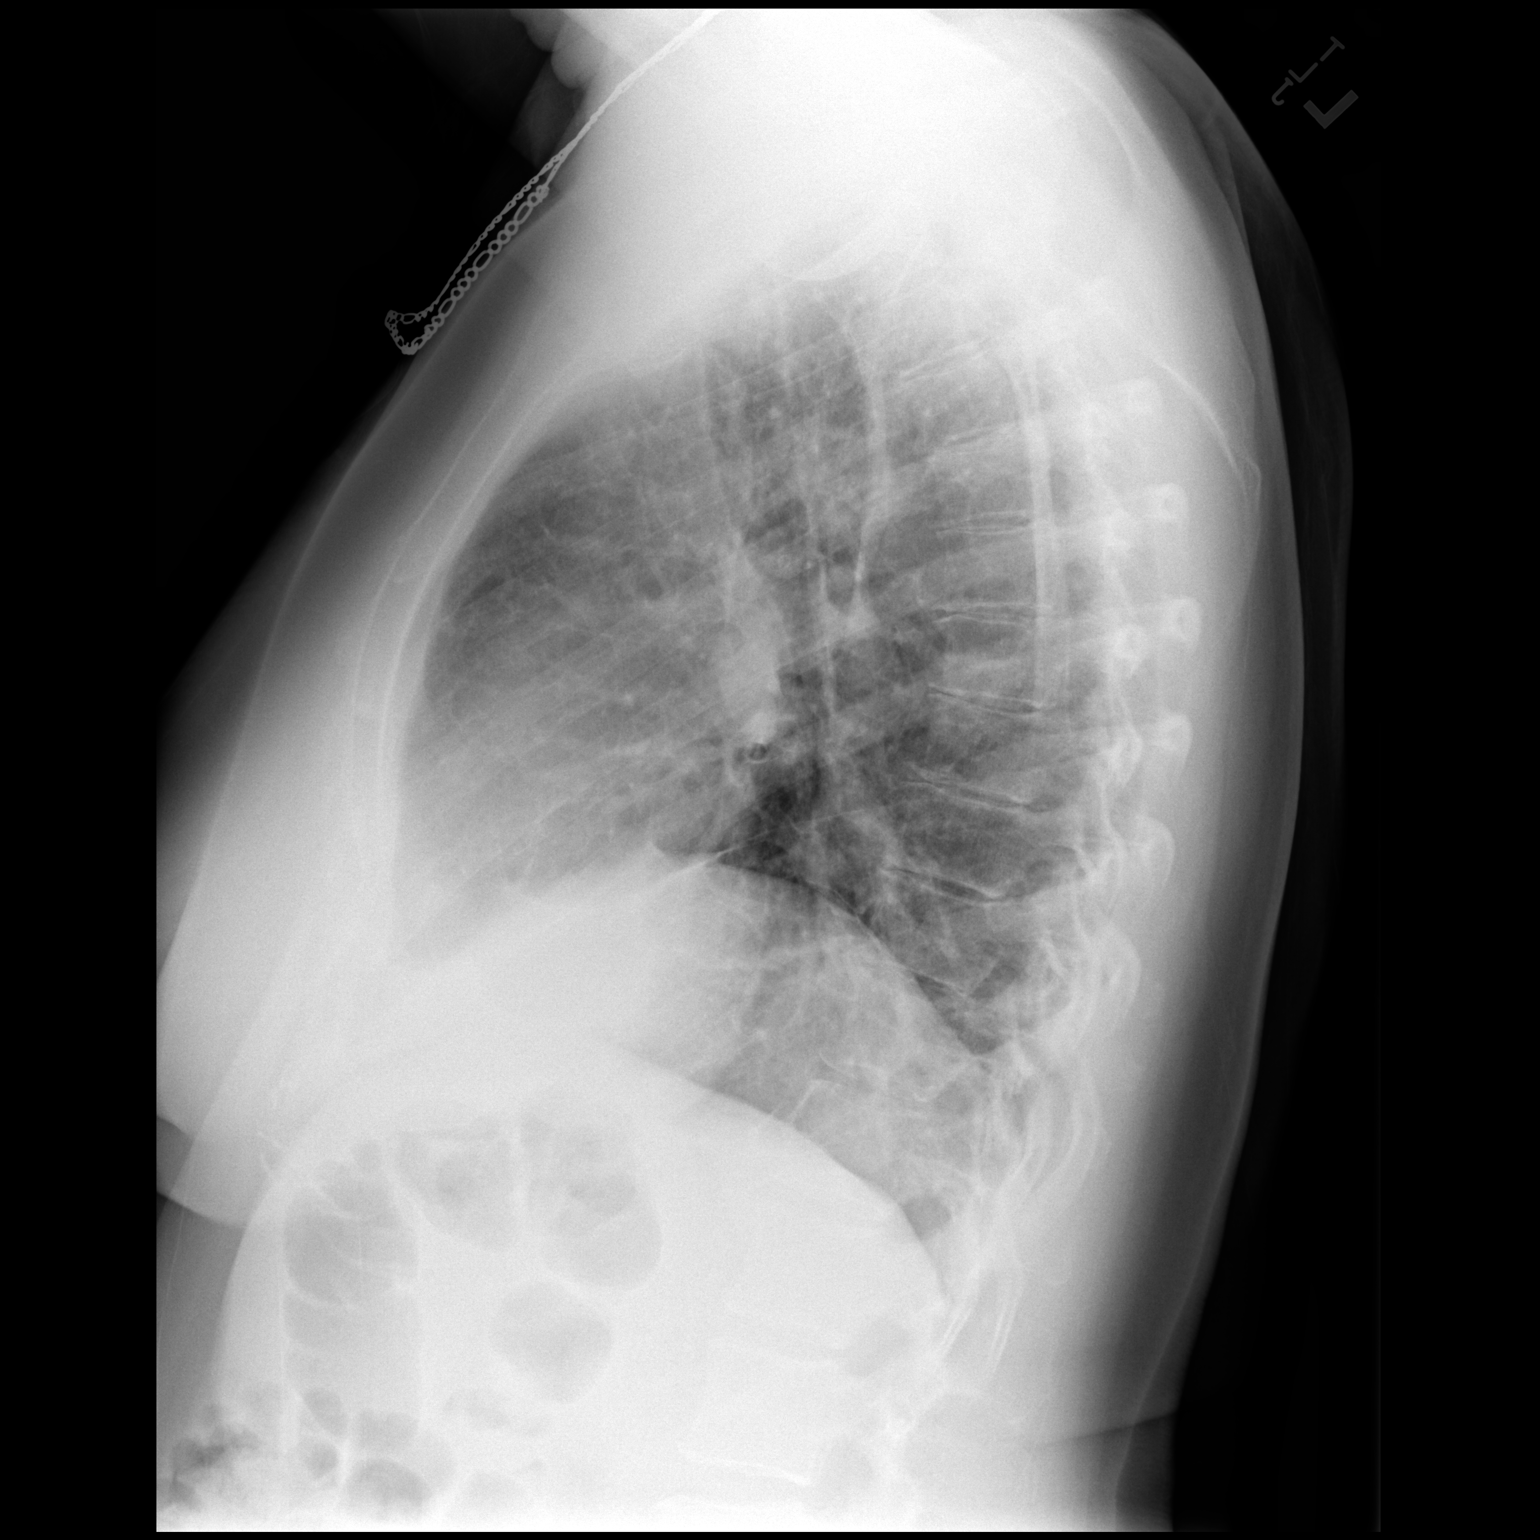

[2 of 2 positions shown; findings below may reference images not displayed]

FINDINGS: Mild chronic elevation of the right diaphragm. Mild bilateral
interstitial thickening. No pleural effusion or pneumothorax. Mild
right basilar atelectasis. No focal consolidation. Stable
cardiomediastinal silhouette. No acute osseous abnormality.
IMPRESSION: 1. No acute cardiopulmonary disease. Mild right basilar atelectasis.

## 2020-09-04 ENCOUNTER — Other Ambulatory Visit: Payer: Self-pay | Admitting: Cardiology

## 2021-03-28 ENCOUNTER — Telehealth: Payer: Self-pay

## 2021-03-28 NOTE — Telephone Encounter (Signed)
Prior Authorization for Pantroprazole Sodium 40 mg tablets approved. Effective from 03/28/2021 through 03/27/2022. ?

## 2021-05-31 ENCOUNTER — Ambulatory Visit (INDEPENDENT_AMBULATORY_CARE_PROVIDER_SITE_OTHER): Payer: BC Managed Care – PPO | Admitting: Cardiology

## 2021-05-31 ENCOUNTER — Encounter: Payer: Self-pay | Admitting: Cardiology

## 2021-05-31 VITALS — BP 116/76 | HR 72 | Ht 66.0 in | Wt 172.0 lb

## 2021-05-31 DIAGNOSIS — R0789 Other chest pain: Secondary | ICD-10-CM

## 2021-05-31 DIAGNOSIS — R0609 Other forms of dyspnea: Secondary | ICD-10-CM | POA: Diagnosis not present

## 2021-05-31 DIAGNOSIS — R079 Chest pain, unspecified: Secondary | ICD-10-CM

## 2021-05-31 NOTE — Progress Notes (Signed)
? ? ? ?Clinical Summary ?Kristy Lewis is a 61 y.o.female seen today for follow up of the following medical problems.  ?  ?1. Chest pain ?- Cardiac cath 2019 normal coronaries, ?- 06/2019 coronary CTA: coronary calcium score of 0, no disease. Normal ASD repair without shunting ? ? ?- chronic chest pains symptoms unchanged ?- often comes on with stress ? ? ?  ?2. DOE ?07/2017 cath (prior to cardiac surgery) mean PA 22, PCWP 17 ?- 04/2018 echo LVEF 18-84%, normal diastolic fxn, mild RV dysfunction ?- 06/2019 coronary CTA: coronary calcium score of 0, no disease. Normal ASD repair without shunting ?- 04/2019 PFTs minimal obstruction ?  ?- no recent SOB/DOE ?  ?3. 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 ?  ?  ?SH: works as Emergency planning/management officer ? ? ?Past Medical History:  ?Diagnosis Date  ? Anginal pain ([place])   ? Comes and goes, pt states its normal to have every day   ? ASD (atrial septal defect), sinus venosus defect   ? Bipolar disorder ([place])   ? pt denies  ? Cardiac murmur   ? Carpal tunnel syndrome   ? Depressive disorder   ? Dysphagia   ? Dyspnea   ? Upon exertion  ? Esophageal reflux   ? Genital herpes   ? at age 4  ? Hypercholesteremia   ? Migraines   ? S/P minimally invasive atrial septal defect closure + repair partial anomalous pulmonary venous return 10/21/2017  ? Urinary incontinence   ? ? ? ?Allergies  ?Allergen Reactions  ? Iodine Swelling and Rash  ?  SWELLING REACTION UNSPECIFIED   ? Morphine And Related Itching and Other (See Comments)  ?  Altered mental status "mean"  ? Shellfish Allergy Swelling and Rash  ?  SWELLING REACTION UNSPECIFIED   ? ? ? ?Current Outpatient Medications  ?Medication Sig Dispense Refill  ? acetaminophen (TYLENOL) 500 MG tablet Take 2 tablets (1,000 mg total) by mouth every 6 (six) hours as needed. 30 tablet 0  ? bisacodyl (DULCOLAX) 5 MG EC tablet Take 5 mg by mouth daily as needed for moderate constipation.     ? FLUoxetine (PROZAC) 40 MG capsule Take 40 mg by mouth daily.     ? fluticasone (FLONASE) 50 MCG/ACT nasal spray Place 2 sprays into both nostrils daily as needed.    ? ibuprofen (ADVIL) 200 MG tablet Take '200mg'$  by mouth twice a day x 7 days, then followed by '200mg'$  daily thereafter. (Patient taking differently: daily as needed for headache or mild pain.)  0  ? Multiple Vitamin (MULTIVITAMIN WITH MINERALS) TABS tablet Take 1 tablet by mouth daily.    ? nitroGLYCERIN (NITROSTAT) 0.4 MG SL tablet Place 1 tablet (0.4 mg total) under the tongue every 5 (five) minutes as needed for chest pain. 30 tablet 0  ? pantoprazole (PROTONIX) 40 MG tablet TAKE 1 TABLET BY MOUTH EVERY DAY 90 tablet 3  ? SUMAtriptan (IMITREX) 50 MG tablet Take 50 mg by mouth every 2 (two) hours as needed for migraine. May repeat in 2 hours if headache persists or recurs.    ? traZODone (DESYREL) 50 MG tablet Take 50 mg by mouth at bedtime as needed.     ? ?No current facility-administered medications for this visit.  ? ? ? ?Past Surgical History:  ?Procedure Laterality Date  ? ABDOMINAL HYSTERECTOMY    ? with bladder surgery. Ovaries remained  ? ASD  REPAIR N/A 10/21/2017  ? Procedure: MINIMALLY INVASIVE REPAIR OF SINUS VENOSUS ATRIAL SEPTAL DEFECT (ASD), CORRECTION OF PARTIAL ANOMALOUS PULMONARY VENOUS RETURN, CLOSURE OF PATENT FORAMEN OVALE;  Surgeon: Rexene Alberts, MD;  Location: [facility];  Service: Open Heart Surgery;  Laterality: N/A;  ? COLONOSCOPY  10/15/2011  ? Procedure: COLONOSCOPY;  Surgeon: Rogene Houston, MD;  [facility];  Service: Endoscopy;  Laterality: N/A;  200  ? HEMORRHOID SURGERY    ? left shoulder manipulation    ? RIGHT/LEFT HEART CATH AND CORONARY ANGIOGRAPHY N/A 08/05/2017  ? Procedure: RIGHT/LEFT HEART CATH AND CORONARY ANGIOGRAPHY;  Surgeon: Belva Crome, MD;  Location: [facility];  Service: Cardiovascular;  Laterality: N/A;  ? TEE WITHOUT CARDIOVERSION N/A 10/21/2017  ? Procedure: TRANSESOPHAGEAL  ECHOCARDIOGRAM (TEE);  Surgeon: Rexene Alberts, MD;  Location: [facility];  Service: Open Heart Surgery;  Laterality: N/A;  ? TONSILLECTOMY    ? ? ? ?Allergies  ?Allergen Reactions  ? Iodine Swelling and Rash  ?  SWELLING REACTION UNSPECIFIED   ? Morphine And Related Itching and Other (See Comments)  ?  Altered mental status "mean"  ? Shellfish Allergy Swelling and Rash  ?  SWELLING REACTION UNSPECIFIED   ? ? ? ? ?Family History  ?Problem Relation Age of Onset  ? Hypertension Mother   ? ? ? ?Social History ?Ms. Gonsalez reports that she quit smoking about 3 years ago. She smoked an average of .5 packs per day. She has never used smokeless tobacco. ?Ms. Pies reports that she does not currently use alcohol after a past usage of about 2.0 - 3.0 standard drinks per week. ? ? ?Review of Systems ?CONSTITUTIONAL: No weight loss, fever, chills, weakness or fatigue.  ?HEENT: Eyes: No visual loss, blurred vision, double vision or yellow sclerae.No hearing loss, sneezing, congestion, runny nose or sore throat.  ?SKIN: No rash or itching.  ?CARDIOVASCULAR: per hpi ?RESPIRATORY: No shortness of breath, cough or sputum.  ?GASTROINTESTINAL: No anorexia, nausea, vomiting or diarrhea. No abdominal pain or blood.  ?GENITOURINARY: No burning on urination, no polyuria ?NEUROLOGICAL: No headache, dizziness, syncope, paralysis, ataxia, numbness or tingling in the extremities. No change in bowel or bladder control.  ?MUSCULOSKELETAL: No muscle, back pain, joint pain or stiffness.  ?LYMPHATICS: No enlarged nodes. No history of splenectomy.  ?PSYCHIATRIC: No history of depression or anxiety.  ?ENDOCRINOLOGIC: No reports of sweating, cold or heat intolerance. No polyuria or polydipsia.  ?. ? ? ?Physical Examination ?Today's Vitals  ? 05/31/21 1524  ?BP: 116/76  ?Pulse: 72  ?SpO2: 98%  ?Weight: 172 lb (78 kg)  ?Height: '5\' 6"'$  (1.676 m)  ? ?Body mass index is 27.76 kg/m?. ? ?Gen: resting comfortably, no acute distress ?HEENT: no scleral  icterus, pupils equal round and reactive, no palptable cervical adenopathy,  ?CV: RRR, no mr/g, no jvd ?Resp: Clear to auscultation bilaterally ?GI: abdomen is soft, non-tender, non-distended, normal bowel sounds, no hepatosplenomegaly ?MSK: extremities are warm, no edema.  ?Skin: warm, no rash ?Neuro:  no focal deficits ?Psych: appropriate affect ? ? ?Diagnostic Studies ? ?CT Chest without contrast 11/10/2018 ordered by Pulmonology ?IMPRESSION: ?1. No definite findings to suggest interstitial lung disease. ?2. Widespread areas of linear scarring, similar to the prior ?examination, presumably related to sequela of remote infection or ?inflammation. ?3. Aortic atherosclerosis. ?4. Small hiatal hernia.  ?Aortic Atherosclerosis (ICD10-I70.0). ?  ?Coronary CT: 09/2017 ?IMPRESSION: ?1.  Large sinus venosus ASD measuring 1.6 cm  ?2.  Anomalous RSPV  draining into the SVC  ?3.  Severe RA/RV enlargement  ?4. Normal right dominant coronary arteries with no calcium seen ?better on CT 08/26/17 ?  ?Cardiac Catheterization: 07/2017 ?Normal coronary arteries. ?Normal LV size and function. ?Normal pulmonary artery pressures. 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   ?Qp/Qs greater than 2.7:1 (using IVC O2 sat as mixed venous in calculation). ?  ?  ?Echocardiogram 05/20/2018:  ? 1. The left ventricle has normal systolic function, with an ejection fraction of 55-60%. The cavity size was normal. There is mild concentric left ventricular hypertrophy. Left ventricular diastolic parameters were normal. No evidence of left ventricular regional wall motion abnormalities. ? 2. The right ventricle has mildly reduced systolic function. The cavity was mildly enlarged. There is no increase in right ventricular wall thickness. ? 3. 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 No significant residual shunt noted. ? 4. The mitral valve is grossly normal. ? 5. The tricuspid valve is grossly normal. ? 6. The aortic

## 2021-05-31 NOTE — Patient Instructions (Signed)

## 2021-06-21 ENCOUNTER — Encounter: Payer: Self-pay | Admitting: *Deleted

## 2021-08-29 ENCOUNTER — Other Ambulatory Visit: Payer: Self-pay | Admitting: Internal Medicine

## 2021-08-29 DIAGNOSIS — Z1231 Encounter for screening mammogram for malignant neoplasm of breast: Secondary | ICD-10-CM

## 2021-10-02 ENCOUNTER — Ambulatory Visit
Admission: RE | Admit: 2021-10-02 | Discharge: 2021-10-02 | Disposition: A | Discharge status: Home / Self Care | Payer: BC Managed Care – PPO | Source: Ambulatory Visit | Attending: Internal Medicine | Admitting: Internal Medicine

## 2021-10-02 DIAGNOSIS — Z1231 Encounter for screening mammogram for malignant neoplasm of breast: Secondary | ICD-10-CM

## 2021-10-14 ENCOUNTER — Encounter (INDEPENDENT_AMBULATORY_CARE_PROVIDER_SITE_OTHER): Payer: Self-pay | Admitting: *Deleted

## 2021-10-22 ENCOUNTER — Other Ambulatory Visit (INDEPENDENT_AMBULATORY_CARE_PROVIDER_SITE_OTHER): Payer: Self-pay

## 2021-10-22 DIAGNOSIS — Z1211 Encounter for screening for malignant neoplasm of colon: Secondary | ICD-10-CM

## 2021-11-14 ENCOUNTER — Encounter (INDEPENDENT_AMBULATORY_CARE_PROVIDER_SITE_OTHER): Payer: Self-pay

## 2021-11-14 ENCOUNTER — Telehealth (INDEPENDENT_AMBULATORY_CARE_PROVIDER_SITE_OTHER): Payer: Self-pay

## 2021-11-14 MED ORDER — PEG 3350-KCL-NA BICARB-NACL 420 G PO SOLR: 4000.0000 mL | ORAL | 0 refills | Status: DC

## 2021-11-14 NOTE — Telephone Encounter (Signed)
Referring MD/PCP: Dr Manuella Ghazi  Procedure: Tcs  Reason/Indication:  Screening  Has patient had this procedure before?  Yes   If so, when, by whom and where? 10 yrs ago    Is there a family history of colon cancer?  Yes   Who?  What age when diagnosed?  Grandmother/Aunt  Is patient diabetic? If yes, Type 1 or Type 2   No       Does patient have prosthetic heart valve or mechanical valve?  No   Do you have a pacemaker/defibrillator?  No   Has patient ever had endocarditis/atrial fibrillation? No   Does patient use oxygen? No  Has patient had joint replacement within last 12 months?  No   Is patient constipated or do they take laxatives? No   Does patient have a history of alcohol/drug use?  No   Have you had a stroke/heart attack last 6 mths? No  Do you take medicine for weight loss?  No   For female patients,: have you had a hysterectomy yes                      are you post menopausal no                       do you still have your menstrual cycle no  Is patient on blood thinner such as Coumadin, Plavix and/or Aspirin? No   Medications: Fuoxetine hcl 40 mg daily, omeprazole 40 mg daily, meloxicam 15 mg daily, diazepam 5 mg prn   Allergies: Sulfur, Morphine   Medication Adjustment per Dr Jenetta Downer : None   Procedure date & time: 12/17/21 at 12:30

## 2021-11-14 NOTE — Telephone Encounter (Signed)
Kristy Lewis, CMA  ?

## 2021-12-09 ENCOUNTER — Telehealth (INDEPENDENT_AMBULATORY_CARE_PROVIDER_SITE_OTHER): Payer: Self-pay | Admitting: *Deleted

## 2021-12-09 NOTE — Telephone Encounter (Signed)
[facility] pres[facility] called to get patient's insurance information. She has a procedure on 11/28 and they need to complete her account. Call [phone] ext [phone]. She has reached out to patient several times with no call back from patient.

## 2021-12-09 NOTE — Telephone Encounter (Signed)
Called pre service center and LMOVM that we are on epic so what she see's is what we see

## 2021-12-11 ENCOUNTER — Encounter (HOSPITAL_COMMUNITY)
Admission: RE | Admit: 2021-12-11 | Discharge: 2021-12-11 | Disposition: A | Discharge status: Home / Self Care | Payer: Self-pay | Source: Ambulatory Visit | Attending: Gastroenterology | Admitting: Gastroenterology

## 2021-12-11 NOTE — Pre-Procedure Instructions (Signed)
Pt called for preop phone call. Pt stated that her insurance was cancelled so she would have to postpone to another date. Office number given to pt for rescheduling.

## 2021-12-17 ENCOUNTER — Ambulatory Visit (HOSPITAL_COMMUNITY)
Admission: RE | Admit: 2021-12-17 | Payer: BC Managed Care – PPO | Source: Home / Self Care | Admitting: Gastroenterology

## 2021-12-17 ENCOUNTER — Encounter (HOSPITAL_COMMUNITY): Admission: RE | Payer: Self-pay | Source: Home / Self Care

## 2021-12-17 SURGERY — COLONOSCOPY WITH PROPOFOL
Anesthesia: Monitor Anesthesia Care

## 2022-01-07 DIAGNOSIS — Z5919 Other inadequate housing: Secondary | ICD-10-CM | POA: Diagnosis not present

## 2022-01-07 DIAGNOSIS — M79671 Pain in right foot: Secondary | ICD-10-CM | POA: Diagnosis not present

## 2022-01-07 DIAGNOSIS — Z809 Family history of malignant neoplasm, unspecified: Secondary | ICD-10-CM | POA: Diagnosis not present

## 2022-01-07 DIAGNOSIS — Z791 Long term (current) use of non-steroidal anti-inflammatories (NSAID): Secondary | ICD-10-CM | POA: Diagnosis not present

## 2022-01-07 DIAGNOSIS — R32 Unspecified urinary incontinence: Secondary | ICD-10-CM | POA: Diagnosis not present

## 2022-01-07 DIAGNOSIS — R69 Illness, unspecified: Secondary | ICD-10-CM | POA: Diagnosis not present

## 2022-01-07 DIAGNOSIS — K219 Gastro-esophageal reflux disease without esophagitis: Secondary | ICD-10-CM | POA: Diagnosis not present

## 2022-01-07 DIAGNOSIS — G8929 Other chronic pain: Secondary | ICD-10-CM | POA: Diagnosis not present

## 2022-01-07 DIAGNOSIS — Z87891 Personal history of nicotine dependence: Secondary | ICD-10-CM | POA: Diagnosis not present

## 2022-02-03 DIAGNOSIS — S338XXA Sprain of other parts of lumbar spine and pelvis, initial encounter: Secondary | ICD-10-CM | POA: Diagnosis not present

## 2022-02-03 DIAGNOSIS — S233XXA Sprain of ligaments of thoracic spine, initial encounter: Secondary | ICD-10-CM | POA: Diagnosis not present

## 2022-02-03 DIAGNOSIS — S134XXA Sprain of ligaments of cervical spine, initial encounter: Secondary | ICD-10-CM | POA: Diagnosis not present

## 2022-02-05 DIAGNOSIS — S134XXA Sprain of ligaments of cervical spine, initial encounter: Secondary | ICD-10-CM | POA: Diagnosis not present

## 2022-02-05 DIAGNOSIS — S338XXA Sprain of other parts of lumbar spine and pelvis, initial encounter: Secondary | ICD-10-CM | POA: Diagnosis not present

## 2022-02-05 DIAGNOSIS — S233XXA Sprain of ligaments of thoracic spine, initial encounter: Secondary | ICD-10-CM | POA: Diagnosis not present

## 2022-02-10 DIAGNOSIS — S233XXA Sprain of ligaments of thoracic spine, initial encounter: Secondary | ICD-10-CM | POA: Diagnosis not present

## 2022-02-10 DIAGNOSIS — S134XXA Sprain of ligaments of cervical spine, initial encounter: Secondary | ICD-10-CM | POA: Diagnosis not present

## 2022-02-10 DIAGNOSIS — S338XXA Sprain of other parts of lumbar spine and pelvis, initial encounter: Secondary | ICD-10-CM | POA: Diagnosis not present

## 2022-04-11 ENCOUNTER — Other Ambulatory Visit: Payer: Self-pay | Admitting: Cardiology

## 2022-05-11 DIAGNOSIS — E663 Overweight: Secondary | ICD-10-CM | POA: Diagnosis not present

## 2022-05-11 DIAGNOSIS — J3089 Other allergic rhinitis: Secondary | ICD-10-CM | POA: Diagnosis not present

## 2022-05-11 DIAGNOSIS — J301 Allergic rhinitis due to pollen: Secondary | ICD-10-CM | POA: Diagnosis not present

## 2022-05-11 DIAGNOSIS — Z6828 Body mass index (BMI) 28.0-28.9, adult: Secondary | ICD-10-CM | POA: Diagnosis not present

## 2022-06-17 ENCOUNTER — Telehealth: Payer: Self-pay | Admitting: Cardiology

## 2022-06-17 NOTE — Telephone Encounter (Signed)
Reports blood pressure has been elevated. BP monitor has not been checked accuracy. Reports BP monitor was purchased 1 year ago. BP 93/142 at lunch time, BP rechecked 96/38, 99/146. Reports headache, blurred vision, dizziness, sob and chest pain. Reports intermittent headache, dizziness and chest pains she's had for awhile. Reports active chest pain rated 3/10. Reports on Sunday BP was 171/111, she was pale, sweating. Did not go to ED on Sunday but she did lie down and felt better. Gave first available appointment to see Philis Nettle on 06/30/2021 @4 :00 pm. Advised to contact PCP for sooner appointment for these symptoms and B management. Advised to take BP monitor to PCP office and cardiology office so that it could be checked for accuracy. Advised if she develops worsening symptoms, to go to the ED for an evaluation. Verbalized understanding of plan.

## 2022-06-17 NOTE — Telephone Encounter (Signed)
Pt c/o BP issue: STAT if pt c/o blurred vision, one-sided weakness or slurred speech  1. What are your last 5 BP readings? 16/109 at 3:00pm today but the concern is that on Sunday it was 171/111  2. Are you having any other symptoms (ex. Dizziness, headache, blurred vision, passed out)? Pt stated when her BP was 171/111 she was very hot and sweaty, had some dizziness and some chest pain.  3. What is your BP issue? Pt is very concerned about her BP episode she had on Sunday and show she's been feeling since. Please advise

## 2022-06-20 DIAGNOSIS — K219 Gastro-esophageal reflux disease without esophagitis: Secondary | ICD-10-CM | POA: Diagnosis not present

## 2022-06-20 DIAGNOSIS — R079 Chest pain, unspecified: Secondary | ICD-10-CM | POA: Diagnosis not present

## 2022-06-20 DIAGNOSIS — Z299 Encounter for prophylactic measures, unspecified: Secondary | ICD-10-CM | POA: Diagnosis not present

## 2022-07-01 ENCOUNTER — Ambulatory Visit: Payer: 59 | Attending: Nurse Practitioner | Admitting: Nurse Practitioner

## 2022-07-01 ENCOUNTER — Encounter: Payer: Self-pay | Admitting: Nurse Practitioner

## 2022-07-01 ENCOUNTER — Encounter: Payer: Self-pay | Admitting: *Deleted

## 2022-07-01 VITALS — BP 128/80 | HR 63 | Ht 66.0 in | Wt 173.6 lb

## 2022-07-01 DIAGNOSIS — Q2112 Patent foramen ovale: Secondary | ICD-10-CM | POA: Diagnosis not present

## 2022-07-01 DIAGNOSIS — R079 Chest pain, unspecified: Secondary | ICD-10-CM | POA: Diagnosis not present

## 2022-07-01 NOTE — Patient Instructions (Signed)
Medication Instructions:   Increase Protonix to twice a day  x 2 weeks, then back to daily  Continue all other medications.     Labwork:  none  Testing/Procedures:  Your physician has requested that you have an echocardiogram. Echocardiography is a painless test that uses sound waves to create images of your heart. It provides your doctor with information about the size and shape of your heart and how well your heart's chambers and valves are working. This procedure takes approximately one hour. There are no restrictions for this procedure. Please do NOT wear cologne, perfume, aftershave, or lotions (deodorant is allowed). Please arrive 15 minutes prior to your appointment time.  Office will contact with results via phone, letter or mychart.     Follow-Up:  3 months   Any Other Special Instructions Will Be Listed Below (If Applicable).   If you need a refill on your cardiac medications before your next appointment, please call your pharmacy.

## 2022-07-01 NOTE — Progress Notes (Unsigned)
Office Visit    Patient Name: Kristy Lewis Date of Encounter: 07/01/2022  PCP:  Kirstie Peri, MD   [facility]  Cardiologist:  Dina Rich, MD *** Advanced Practice Provider:  No care team member to display Electrophysiologist:  None  {Press F2 to show EP APP, CHF, sleep or structural heart MD               :[id number]}  { Click here to update then REFRESH NOTE - MD (PCP) or APP (Team Member)  Change PCP Type for MD, Specialty for APP is either Cardiology or Clinical Cardiac Electrophysiology  :[id number]}  Chief Complaint    Kristy Lewis is a 62 y.o. female with a hx of chest pain, DOE, history of sinus venosus ASD repair, who presents today for 1 year follow-up.    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 Ovaries remained   ASD REPAIR N/A 10/21/2017   Procedure: MINIMALLY INVASIVE REPAIR OF SINUS VENOSUS ATRIAL SEPTAL DEFECT (ASD), CORRECTION OF PARTIAL ANOMALOUS PULMONARY VENOUS RETURN, CLOSURE OF PATENT FORAMEN OVALE;  Surgeon: Purcell Nails, MD;  Location: [facility] OR;  Service: Open Heart Surgery;  Laterality: N/A;   COLONOSCOPY  10/15/2011   Procedure: COLONOSCOPY;  Surgeon: Malissa Hippo, MD;  Location: [facility] ENDO SUITE;  Service: Endoscopy;  Laterality: N/A;  200   HEMORRHOID SURGERY     left shoulder  manipulation     RIGHT/LEFT HEART CATH AND CORONARY ANGIOGRAPHY N/A 08/05/2017   Procedure: RIGHT/LEFT HEART CATH AND CORONARY ANGIOGRAPHY;  Surgeon: Lyn Records, MD;  Location: [facility] INVASIVE CV LAB;  Service: Cardiovascular;  Laterality: N/A;   TEE WITHOUT CARDIOVERSION N/A 10/21/2017   Procedure: TRANSESOPHAGEAL ECHOCARDIOGRAM (TEE);  Surgeon: Purcell Nails, MD;  Location: [facility] OR;  Service: Open Heart Surgery;  Laterality: N/A;   TONSILLECTOMY      Allergies  Allergies  Allergen Reactions   Iodine Swelling and Rash    SWELLING REACTION UNSPECIFIED    Morphine And Codeine Itching and Other (See Comments)    Altered mental status "mean"   Shellfish Allergy Swelling and Rash    SWELLING REACTION UNSPECIFIED     History of Present Illness    Kristy Lewis is a 62 y.o. female with a PMH as mentioned above.   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  Previous cardiovascular history includes cardiac catheterization 2019 that revealed normal coronary arteries.  Echocardiogram in 2020 showed EF 55 to 60%, mild LVH.   Underwent coronary CTA in 2021 that showed coronary calcium score 0.  Normal ASD repair without shunting.  PFTs in 2021 showed minimal obstruction.  Last seen by Dr. Dina Rich 1 year ago.  Noted chronic stable symptoms regarding chest pain, Dr. Wyline Mood stated due to no evidence of CAD, continue to monitor.  Overall was doing well.    Today she presents for 1 year follow-up.  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  EKGs/Labs/Other Studies Reviewed:   The following studies were reviewed today: ***  EKG:  EKG is *** ordered today.  The ekg ordered today demonstrates ***  Recent Labs: No results found for requested labs within last 365 days.  Recent Lipid Panel No results found for: "CHOL", "TRIG", "HDL", "CHOLHDL", "VLDL", "LDLCALC", "LDLDIRECT"  Risk  Assessment/Calculations:  {Does this patient have ATRIAL FIBRILLATION?[phone]}  Home Medications   No outpatient medications have been marked as taking for the 07/01/22 encounter (Appointment) with Sharlene Dory, NP.     Review of Systems   ***   All other systems reviewed and are otherwise negative except as noted above.  Physical Exam    VS:  There were no vitals taken for this visit. , BMI There is no height or weight on file to calculate BMI.  Wt Readings from Last 3 Encounters:  05/31/21 172 lb (78 kg)  03/07/20 176 lb (79.8 kg)  04/26/19 177 lb 3.2 oz (80.4 kg)     GEN: Well nourished, well developed, in no acute distress. HEENT: normal. Neck: Supple, no JVD, carotid bruits, or masses. Cardiac: ***RRR, no murmurs, rubs, or gallops. No clubbing, cyanosis, edema.  ***Radials/PT 2+ and equal bilaterally.  Respiratory:  ***Respirations regular and unlabored, clear to auscultation bilaterally. GI: Soft, nontender, nondistended. MS: No deformity or atrophy. Skin: Warm and dry, no rash. Neuro:  Strength and sensation are intact. Psych: Normal affect.  Assessment & Plan    ***  {Are you ordering a CV Procedure (e.g. stress test, cath, DCCV, TEE, etc)?   Press F2        :[id number]}      Disposition: Follow up {follow up:15908} with Dina Rich, MD or APP.  Signed, Sharlene Dory, NP 07/01/2022, 1:09 PM [facility]

## 2022-07-12 ENCOUNTER — Emergency Department (HOSPITAL_COMMUNITY)
Admission: EM | Admit: 2022-07-12 | Discharge: 2022-07-12 | Disposition: A | Discharge status: Home / Self Care | Payer: 59 | Attending: Emergency Medicine | Admitting: Emergency Medicine

## 2022-07-12 ENCOUNTER — Encounter (HOSPITAL_COMMUNITY): Payer: Self-pay | Admitting: Emergency Medicine

## 2022-07-12 ENCOUNTER — Other Ambulatory Visit: Payer: Self-pay

## 2022-07-12 DIAGNOSIS — R519 Headache, unspecified: Secondary | ICD-10-CM | POA: Insufficient documentation

## 2022-07-12 DIAGNOSIS — I10 Essential (primary) hypertension: Secondary | ICD-10-CM | POA: Diagnosis not present

## 2022-07-12 DIAGNOSIS — R030 Elevated blood-pressure reading, without diagnosis of hypertension: Secondary | ICD-10-CM

## 2022-07-12 LAB — BASIC METABOLIC PANEL
Anion gap: 6 (ref 5–15)
BUN: 15 mg/dL (ref 8–23)
CO2: 25 mmol/L (ref 22–32)
Calcium: 8.7 mg/dL — ABNORMAL LOW (ref 8.9–10.3)
Chloride: 105 mmol/L (ref 98–111)
Creatinine, Ser: 0.88 mg/dL (ref 0.44–1.00)
GFR, Estimated: >60 mL/min (ref >60)
Glucose, Bld: 96 mg/dL (ref 70–99)
Potassium: 3.8 mmol/L (ref 3.5–5.1)
Sodium: 136 mmol/L (ref 135–145)

## 2022-07-12 LAB — CBC
HCT: 38.6 % (ref 36.0–46.0)
Hemoglobin: 13 g/dL (ref 12.0–15.0)
MCH: 32.5 pg (ref 26.0–34.0)
MCHC: 33.7 g/dL (ref 30.0–36.0)
MCV: 96.5 fL (ref 80.0–100.0)
Platelets: 185 10*3/uL (ref 150–400)
RBC: 4 MIL/uL (ref 3.87–5.11)
RDW: 12.6 % (ref 11.5–15.5)
WBC: 6 10*3/uL (ref 4.0–10.5)
nRBC: 0 % (ref 0.0–0.2)

## 2022-07-12 MED ORDER — SODIUM CHLORIDE 0.9 % IV BOLUS
500.0000 mL | Freq: Once | INTRAVENOUS | Status: AC
  Administered 2022-07-12: 500 mL via INTRAVENOUS

## 2022-07-12 MED ORDER — KETOROLAC TROMETHAMINE 30 MG/ML IJ SOLN
30.0000 mg | Freq: Once | INTRAMUSCULAR | Status: AC
  Administered 2022-07-12: 30 mg via INTRAVENOUS
  Filled 2022-07-12: qty 1

## 2022-07-12 MED ORDER — SODIUM CHLORIDE 0.9 % IV SOLN
12.5000 mg | Freq: Once | INTRAVENOUS | Status: AC
  Administered 2022-07-12: 12.5 mg via INTRAVENOUS
  Filled 2022-07-12: qty 0.5

## 2022-07-12 NOTE — ED Notes (Signed)
Pt came to ED for HA Hx of migraines Photophobic, nausea, dizzy Went to urgent care 2-3 weeks ago. Migraines have been increasing  Pt stated that migraine cocktail usually works for pain

## 2022-07-12 NOTE — ED Provider Notes (Signed)
[place] [facility] AT [facility] Provider Note   CSN: [id number] Arrival date & time: 07/12/22  1032     History  Chief Complaint  Patient presents with   Headache    Kristy Lewis is a 62 y.o. female with history of migraines, carpal tunnel, depression, efflux, HLD, ASD s/p repair 2019, who presents to the ER complaining of elevated blood pressure and headache. States she does not have history/diagnosis of HTN. Hx of migraines but has not had one in about 5 years or so. Took 800 mg ibuprofen this morning without relief. This current headache started yesterday. Associated nausea, light sensitivity, ear sensitivity. Headache localized to the back of the head. Some intermittent chest pain for several months.    Headache Associated symptoms: nausea and photophobia        Home Medications Prior to Admission medications   Medication Sig Start Date End Date Taking? Authorizing Provider  acetaminophen (TYLENOL) 500 MG tablet Take 2 tablets (1,000 mg total) by mouth every 6 (six) hours as needed. 10/26/17  Yes Barrett, Erin R, PA-C  FLUoxetine (PROZAC) 40 MG capsule Take 40 mg by mouth daily.    Yes [provider]  fluticasone (FLONASE) 50 MCG/ACT nasal spray Place 2 sprays into both nostrils daily as needed. 03/28/19  Yes [provider]  Multiple Vitamin (MULTIVITAMIN WITH MINERALS) TABS tablet Take 1 tablet by mouth daily.   Yes [provider]  pantoprazole (PROTONIX) 40 MG tablet TAKE 1 TABLET BY MOUTH EVERY DAY 04/11/22  Yes Branch, Dorothe Pea, MD  ibuprofen (ADVIL) 200 MG tablet Take 200mg  by mouth twice a day x 7 days, then followed by 200mg  daily thereafter. Patient taking differently: 200 mg every 6 (six) hours as needed for headache or mild pain. 01/08/18   Laqueta Linden, MD  nitroGLYCERIN (NITROSTAT) 0.4 MG SL tablet Place 1 tablet (0.4 mg total) under the tongue every 5 (five) minutes as needed for chest  pain. Patient not taking: Reported on 05/31/2021 07/28/17   Donnetta Hutching, MD      Allergies    Iodine, Morphine and codeine, and Shellfish allergy    Review of Systems   Review of Systems  Eyes:  Positive for photophobia.  Gastrointestinal:  Positive for nausea.  Neurological:  Positive for headaches.  All other systems reviewed and are negative.   Physical Exam Updated Vital Signs BP (!) 180/95 (BP Location: Right Arm)   Pulse (!) 54   Temp 98.1 F (36.7 C) (Oral)   Resp 16   Ht 5\' 7"  (1.702 m)   Wt 77.1 kg   SpO2 97%   BMI 26.63 kg/m  Physical Exam Vitals and nursing note reviewed.  Constitutional:      Appearance: Normal appearance.  HENT:     Head: Normocephalic and atraumatic.  Eyes:     Conjunctiva/sclera: Conjunctivae normal.  Cardiovascular:     Rate and Rhythm: Normal rate and regular rhythm.  Pulmonary:     Effort: Pulmonary effort is normal. No respiratory distress.     Breath sounds: Normal breath sounds.  Abdominal:     General: There is no distension.     Palpations: Abdomen is soft.     Tenderness: There is no abdominal tenderness.  Skin:    General: Skin is warm and dry.  Neurological:     General: No focal deficit present.     Mental Status: She is alert.     Comments: Neuro: Speech is clear,  able to follow commands. CN III-XII intact grossly intact. PERRLA. EOMI. Sensation intact throughout. Str 5/5 all extremities.     ED Results / Procedures / Treatments   Labs (all labs ordered are listed, but only abnormal results are displayed) Labs Reviewed  BASIC METABOLIC PANEL - Abnormal; Notable for the following components:      Result Value   Calcium 8.7 (*)    All other components within normal limits  CBC    EKG None  Radiology No results found.  Procedures Procedures    Medications Ordered in ED Medications  ketorolac (TORADOL) 30 MG/ML injection 30 mg (30 mg Intravenous Given 07/12/22 1146)  sodium chloride 0.9 % bolus 500 mL (0  mLs Intravenous Stopped 07/12/22 1248)  promethazine (PHENERGAN) 12.5 mg in sodium chloride 0.9 % 50 mL IVPB (0 mg Intravenous Stopped 07/12/22 1248)    ED Course/ Medical Decision Making/ A&P                             Medical Decision Making Amount and/or Complexity of Data Reviewed Labs: ordered.  Risk Prescription drug management.   This patient is a 62 y.o. female  who presents to the ED for concern of headache, elevated BP.   Differential diagnoses prior to evaluation: The emergent differential diagnosis includes, but is not limited to,  Stroke, increased ICP, meningitis, CVA, intracranial tumor, venous sinus thrombosis, migraine, cluster headache, hypertension, drug related, head injury, tension headache, sinusitis, dental abscess, otitis media, TMJ, temporal arteritis, glaucoma, trigeminal neuralgia. This is not an exhaustive differential.   Past Medical History / Co-morbidities / Social History: migraines, carpal tunnel, depression, efflux, HLD, ASD s/p repair 2019  Additional history: Chart reviewed. Pertinent results include: Reviewed telephone encounter from 5/28 where patient was complaining of BP's up to 171/111 with diaphoresis and chest pain. Went to cardiologist on 6/11, they believed CP likely GI in nature. Recommended updating echocardiogram and starting protonix.   Coronary calcium score of 0 in 2021.   Physical Exam: Physical exam performed. The pertinent findings include: Hypertensive, highest reading 186/104. Otherwise normal vital signs, no acute distress. Heart RRR, normal respiratory effort and clear lungs. No peripheral edema. Normal neurologic exam as above.   Lab Tests/Imaging studies: I personally interpreted labs/imaging and the pertinent results include:  CBC and BMP unremarkable.   Unable to perform CT head imaging at this facility due to malfunctioning equipment. Patient reevaluated after migraine cocktail and symptoms have resolved, don't believe  she is requiring CT imaging at this time.   Cardiac monitoring: EKG obtained and interpreted by myself and attending physician which shows: sinus rhythm   Medications: I ordered medication including migraine cocktail of toradol, IVF, and phenergan.  I have reviewed the patients home medicines and have made adjustments as needed. Upon reevaluation patient states symptoms have resolved.    Disposition: After consideration of the diagnostic results and the patients response to treatment, I feel that emergency department workup does not suggest an emergent condition requiring admission or immediate intervention beyond what has been performed at this time. The plan is: discharge to home with strong recommendation to follow up with PCP and cardiology regarding elevated blood pressure readings as patient would likely benefit from initiation of blood pressure medication. Difficult to know if headaches are related to blood pressure or related to hx of migraines, however headache resolved after migraine cocktail. Normal neurologic exam. Very low concern for hypertensive urgency or emergency,  no evidence of end organ damage. The patient is safe for discharge and has been instructed to return immediately for worsening symptoms, change in symptoms or any other concerns.  I discussed this case with my attending physician Dr. Jeraldine Loots who cosigned this note including patient's presenting symptoms, physical exam, and planned diagnostics and interventions. Attending physician stated agreement with plan or made changes to plan which were implemented.   Final Clinical Impression(s) / ED Diagnoses Final diagnoses:  Bad headache  Elevated blood pressure reading    Rx / DC Orders ED Discharge Orders     None      Portions of this report may have been transcribed using voice recognition software. Every effort was made to ensure accuracy; however, inadvertent computerized transcription errors may be present.     Jeanella Flattery 07/12/22 1339    Gerhard Munch, MD 07/12/22 1409

## 2022-07-12 NOTE — Discharge Instructions (Signed)
You were seen in the ER today for headache and elevated blood pressure.  As we discussed, your blood work and EKG looked reassuring today. I'm glad your headache improved after the medications we gave you.   If your headache returns and is not resolving with medications at home, I strongly recommend returning to the ER for reevaluation, and you may require CT imaging of your head at that time.  I recommend following up with your PCP and cardiologist regarding your blood pressure readings. I've attached a form you can use to help keep track of your readings so you all can discuss initiating blood pressure medication.

## 2022-07-12 NOTE — ED Notes (Signed)
Pt resting comfortably Stated that her pain is "much better"

## 2022-07-12 NOTE — ED Triage Notes (Signed)
Pt reports headache and hypertension that started last night. Pt reports no history of hypertension. Pt reports hx of migraines but "has not had one since heart surgery in 2019."

## 2022-08-05 ENCOUNTER — Ambulatory Visit: Payer: 59 | Attending: Nurse Practitioner

## 2022-08-05 DIAGNOSIS — I088 Other rheumatic multiple valve diseases: Secondary | ICD-10-CM | POA: Diagnosis not present

## 2022-08-05 DIAGNOSIS — Q2112 Patent foramen ovale: Secondary | ICD-10-CM | POA: Diagnosis not present

## 2022-08-05 DIAGNOSIS — I517 Cardiomegaly: Secondary | ICD-10-CM

## 2022-08-06 ENCOUNTER — Telehealth: Payer: Self-pay | Admitting: Cardiology

## 2022-08-06 LAB — ECHOCARDIOGRAM COMPLETE
AR max vel: 2.42 cm2
AV Area VTI: 2.55 cm2
AV Area mean vel: 2.43 cm2
AV Mean grad: 3 mmHg
AV Peak grad: 5.7 mmHg
Ao pk vel: 1.19 m/s
Area-P 1/2: 3.48 cm2
Calc EF: 58 %
S' Lateral: 3 cm
Single Plane A2C EF: 55.9 %
Single Plane A4C EF: 57.4 %

## 2022-08-06 NOTE — Telephone Encounter (Signed)
Images have not yet been viewed. Patient is aware.

## 2022-08-06 NOTE — Telephone Encounter (Signed)
Patient is requesting call back to get update on echo results.

## 2022-08-07 ENCOUNTER — Telehealth: Payer: Self-pay | Admitting: Cardiology

## 2022-08-07 ENCOUNTER — Ambulatory Visit: Payer: 59 | Attending: Cardiology

## 2022-08-07 VITALS — BP 152/88 | HR 64 | Ht 67.0 in | Wt 171.6 lb

## 2022-08-07 DIAGNOSIS — I10 Essential (primary) hypertension: Secondary | ICD-10-CM

## 2022-08-07 MED ORDER — AMLODIPINE BESYLATE 2.5 MG PO TABS: 2.5000 mg | ORAL_TABLET | Freq: Every day | ORAL | 1 refills | Status: DC

## 2022-08-07 NOTE — Progress Notes (Signed)
Elevated blood pressure. Has she been taking any NSAIDs regular, recent courses of prednisone? If not would start norvasc 2.5mg  daily and come back Monday for repeat bp check   Dominga Ferry MD   Patient declined any courses of prednisone or NSAIDs. Sent in Norvasc 2.5 mg daily and scheduled nurse visit. Advise her to keep a log of BP readings and to take her BP 2 hours after  taking medication and take reading at night as well. Patient verbalized understanding.

## 2022-08-07 NOTE — Progress Notes (Signed)
Elevated blood pressure. Has she been taking any NSAIDs regular, recent courses of prednisone? If not would start norvasc 2.5mg  daily and come back Monday for repeat bp check  Dominga Ferry MD

## 2022-08-07 NOTE — Telephone Encounter (Signed)
Pt walked into the office stating that she's having high blood pressure this morning  149/101  151/96  135/96  Last reading was 168/104

## 2022-08-07 NOTE — Telephone Encounter (Signed)
See nurse visit notes

## 2022-08-07 NOTE — Progress Notes (Signed)
Patient reported high blood pressure readings: Pt walked into the office stating that she's having high blood pressure this morning   149/101   151/96   135/96   Last reading was 168/104-this morning  Pt reports of chest pains-pain level 3 Dizziness No SOB Patient stated that she took an Aspirin this morning for her headache. She takes her BP in the mornings when she wakes up and again at night. Stated that the readings at night are better. No record. She also stated that she has a headache this morning pain level being a 5. Went over medications. Took BP in both arms-LT-158/98 RT-152/88. Advised patient to have a seat in the waiting room and will speak with Dr. Wyline Mood.

## 2022-08-11 ENCOUNTER — Ambulatory Visit: Payer: 59 | Attending: Cardiology

## 2022-08-11 ENCOUNTER — Ambulatory Visit: Payer: 59

## 2022-08-11 DIAGNOSIS — R030 Elevated blood-pressure reading, without diagnosis of hypertension: Secondary | ICD-10-CM

## 2022-08-11 MED ORDER — AMLODIPINE BESYLATE 2.5 MG PO TABS: 2.5000 mg | ORAL_TABLET | Freq: Two times a day (BID) | ORAL | 1 refills | Status: DC

## 2022-08-11 NOTE — Progress Notes (Signed)
Patient presents to have vitals/ BP checked per last nurse visit   Blood Pressure:124/78 PULSE:72 SPO2:97  Patient states she believes the new medication is making her tired and thirsty which is Amlodipine 2.5 mg daily   She also brought BP readings over the last 2 weeks and they have seemed to improve  Readings given to EP and she went over recent Echo results with patient   EP suggested starting Amlodipine 2.5 MG in AM AND PM Following up in 4 weeks with Philis Nettle   To consider a stress test for reassurance.

## 2022-08-11 NOTE — Patient Instructions (Addendum)
Medication Instructions:  Your physician has recommended you make the following change in your medication:  Start taking Amlodipine 2.5 Mg  in the morning and at night   Labwork: none  Testing/Procedures: none  Follow-Up: Your physician recommends that you schedule a follow-up appointment in: 4 weeks with Philis Nettle  Any Other Special Instructions Will Be Listed Below (If Applicable).  If you need a refill on your cardiac medications before your next appointment, please call your pharmacy.

## 2022-08-19 DIAGNOSIS — E7800 Pure hypercholesterolemia, unspecified: Secondary | ICD-10-CM | POA: Diagnosis not present

## 2022-08-19 DIAGNOSIS — F319 Bipolar disorder, unspecified: Secondary | ICD-10-CM | POA: Diagnosis not present

## 2022-08-19 DIAGNOSIS — I700 Atherosclerosis of aorta: Secondary | ICD-10-CM | POA: Diagnosis not present

## 2022-08-19 DIAGNOSIS — Z79899 Other long term (current) drug therapy: Secondary | ICD-10-CM | POA: Diagnosis not present

## 2022-08-19 DIAGNOSIS — Z1331 Encounter for screening for depression: Secondary | ICD-10-CM | POA: Diagnosis not present

## 2022-08-19 DIAGNOSIS — Z299 Encounter for prophylactic measures, unspecified: Secondary | ICD-10-CM | POA: Diagnosis not present

## 2022-08-19 DIAGNOSIS — R5383 Other fatigue: Secondary | ICD-10-CM | POA: Diagnosis not present

## 2022-08-19 DIAGNOSIS — Z0000 Encounter for general adult medical examination without abnormal findings: Secondary | ICD-10-CM | POA: Diagnosis not present

## 2022-09-25 DIAGNOSIS — R5383 Other fatigue: Secondary | ICD-10-CM | POA: Diagnosis not present

## 2022-09-25 DIAGNOSIS — U071 COVID-19: Secondary | ICD-10-CM | POA: Diagnosis not present

## 2022-10-02 ENCOUNTER — Ambulatory Visit: Payer: 59 | Admitting: Nurse Practitioner

## 2022-10-02 NOTE — Progress Notes (Deleted)
Office Visit    Patient Name: Kristy Lewis Date of Encounter: 07/01/2022  PCP:  Kirstie Peri, MD   [facility]  Cardiologist:  Dina Rich, MD  Advanced Practice Provider:  No care team member to display Electrophysiologist:  None   Chief Complaint    Kristy Lewis is a 62 y.o. female with a hx of chest pain, DOE, history of sinus venosus ASD repair, who presents today for scheduled follow-up.    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 Ovaries remained   ASD REPAIR N/A 10/21/2017   Procedure: MINIMALLY INVASIVE REPAIR OF SINUS VENOSUS ATRIAL SEPTAL DEFECT (ASD), CORRECTION OF PARTIAL ANOMALOUS PULMONARY VENOUS RETURN, CLOSURE OF PATENT FORAMEN OVALE;  Surgeon: Purcell Nails, MD;  Location: [facility] OR;  Service: Open Heart Surgery;  Laterality: N/A;   COLONOSCOPY  10/15/2011   Procedure: COLONOSCOPY;  Surgeon: Malissa Hippo, MD;  Location: [facility] ENDO SUITE;  Service: Endoscopy;  Laterality: N/A;  200   HEMORRHOID SURGERY     left shoulder manipulation     RIGHT/LEFT HEART CATH AND CORONARY ANGIOGRAPHY N/A 08/05/2017   Procedure: RIGHT/LEFT HEART CATH AND CORONARY ANGIOGRAPHY;  Surgeon: Lyn Records, MD;  Location: [facility] INVASIVE CV LAB;  Service: Cardiovascular;  Laterality: N/A;   TEE WITHOUT CARDIOVERSION N/A  10/21/2017   Procedure: TRANSESOPHAGEAL ECHOCARDIOGRAM (TEE);  Surgeon: Purcell Nails, MD;  Location: [facility] OR;  Service: Open Heart Surgery;  Laterality: N/A;   TONSILLECTOMY      Allergies  Allergies  Allergen Reactions   Iodine Swelling and Rash    SWELLING REACTION UNSPECIFIED    Morphine And Codeine Itching and Other (See Comments)    Altered mental status "mean"   Shellfish Allergy Swelling and Rash    SWELLING REACTION UNSPECIFIED     History of Present Illness    Kristy Lewis is a 62 y.o. female with a PMH as mentioned above.   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  Previous cardiovascular history includes cardiac catheterization 2019 that revealed normal coronary arteries.  Echocardiogram in 2020 showed EF 55 to 60%, mild LVH.   Underwent coronary CTA in 2021 that showed coronary calcium score 0.  Normal ASD repair without shunting.  PFTs in 2021 showed minimal obstruction.  Last seen by Dr. Dina Rich 1 year ago.  Noted chronic stable symptoms regarding chest pain, Dr. Wyline Mood stated due to no evidence of CAD, continue to monitor.  Overall was doing well.    Recently contacted our office noting elevated BP, also noting dizziness, chest pains, and intermittent HA.   Today  she presents for evaluation.  She notes intermittent chest pain for several months. Not always associated with exertion. Located mid chest, no radiation, sitting helps relieve the pain. PCP believes it is GERD related, started her on Protonix that has helped this pain. States EKG at PCP's office looked good. Denies any recurrence in her high BP that occurred several weeks ago. Denies any shortness of breath, palpitations, syncope, presyncope, dizziness, orthopnea, PND, swelling or significant weight changes, acute bleeding, or claudication.  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   CCTA 06/2019:  IMPRESSION: 1. Coronary calcium score of 0. This was 0 percentile for age and sex matched control.   2. Normal coronary origin with right dominance.   3. CAD-RADS 0. No evidence of CAD (0%). Consider non-atherosclerotic causes of chest pain.   4. Moderately dilated pulmonary artery measuring 38 mm.   5. When compared to the prior study from 08/26/2017 a large sinus venosus ASD with anomalous RUPV is now repaired and there is no residual shunting. Right atrial and right ventricular size has decreased, now only mildly enlarged.  Echo 04/2018: 1. The left ventricle has normal systolic function, with an ejection  fraction of 55-60%. The cavity size was normal. There is mild concentric  left ventricular hypertrophy. Left ventricular diastolic parameters were  normal. No evidence of left  ventricular regional wall motion abnormalities.   2. The right ventricle has mildly reduced systolic function. The cavity  was mildly enlarged. There is no increase in right ventricular wall  thickness.   3. S/p minimally invasive repair of sinus venosus ASD, correction of  partial anomalous pulmonary venous return, and closure of a PFO. No  significant residual shunt noted.   4. The mitral valve is grossly normal.   5. The tricuspid valve is grossly normal.   6. The aortic valve is grossly normal.   7. No pericardial effusion.  Recent Labs: 07/12/2022: BUN 15; Creatinine, Ser 0.88; Hemoglobin 13.0; Platelets 185; Potassium 3.8; Sodium 136  Recent Lipid Panel No results found for: "CHOL", "TRIG", "HDL", "CHOLHDL", "VLDL", "LDLCALC", "LDLDIRECT"  Home Medications   No outpatient medications have been marked as taking for the 10/02/22 encounter (Appointment) with Sharlene Dory, NP.     Review of Systems   All other systems reviewed and are otherwise negative  except as noted above.  Physical Exam    VS:  There were no vitals taken for this visit. , BMI There is no height or weight on file to calculate BMI.  Wt Readings from Last 3 Encounters:  08/11/22 174 lb 6.4 oz (79.1 kg)  08/07/22 171 lb 9.6 oz (77.8 kg)  07/12/22 170 lb (77.1 kg)     GEN: Well nourished, well developed, in no acute distress. HEENT: normal. Neck: Supple, no JVD, carotid bruits, or masses. Cardiac: S1/S2, RRR, no murmurs, rubs, or gallops. No clubbing, cyanosis, edema.  Radials/PT 2+ and equal bilaterally.  Respiratory:  Respirations regular and unlabored, clear to auscultation bilaterally. GI: Soft, nontender, nondistended. MS: No deformity or atrophy. Skin: Warm and dry, no rash. Neuro:  Strength and sensation are intact. Psych: Normal affect.  Assessment & Plan    Chest pain Etiology most likely r/t GI in nature, hx of small hiatal hernia. CCTA in 2021 was negative for CAD. Will request EKG record from PCP's  office. Recommended to take Protonix 40 mg BID x 2 weeks, then return to normal. Heart healthy diet and regular cardiovascular exercise encouraged. ED precautions discussed. If no improvement by next OV, will refer to GI. Continue to follow with PCP.  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 Echo in 2020 revealed no significant residual shunt noted. Will update Echo at this time.    Disposition: Follow up in 3 month(s) with Dina Rich, MD or APP.  Signed, Sharlene Dory, NP 10/02/2022, 7:58 AM [facility]

## 2022-10-27 ENCOUNTER — Ambulatory Visit: Payer: 59 | Attending: Nurse Practitioner | Admitting: Nurse Practitioner

## 2022-10-27 ENCOUNTER — Encounter: Payer: Self-pay | Admitting: Nurse Practitioner

## 2022-10-27 VITALS — BP 136/86 | HR 58 | Ht 67.0 in | Wt 173.0 lb

## 2022-10-27 DIAGNOSIS — R5383 Other fatigue: Secondary | ICD-10-CM | POA: Diagnosis not present

## 2022-10-27 DIAGNOSIS — Q249 Congenital malformation of heart, unspecified: Secondary | ICD-10-CM

## 2022-10-27 DIAGNOSIS — F439 Reaction to severe stress, unspecified: Secondary | ICD-10-CM | POA: Diagnosis not present

## 2022-10-27 DIAGNOSIS — R030 Elevated blood-pressure reading, without diagnosis of hypertension: Secondary | ICD-10-CM

## 2022-10-27 DIAGNOSIS — E785 Hyperlipidemia, unspecified: Secondary | ICD-10-CM | POA: Diagnosis not present

## 2022-10-27 DIAGNOSIS — Z13220 Encounter for screening for lipoid disorders: Secondary | ICD-10-CM

## 2022-10-27 MED ORDER — AMLODIPINE BESYLATE 2.5 MG PO TABS: 2.5000 mg | ORAL_TABLET | Freq: Every day | ORAL | 3 refills | Status: AC

## 2022-10-27 MED ORDER — NITROGLYCERIN 0.4 MG SL SUBL: 0.4000 mg | SUBLINGUAL_TABLET | SUBLINGUAL | 3 refills | Status: AC | PRN

## 2022-10-27 MED ORDER — NITROGLYCERIN 0.4 MG SL SUBL: 0.4000 mg | SUBLINGUAL_TABLET | SUBLINGUAL | 3 refills | Status: DC | PRN

## 2022-10-27 NOTE — Patient Instructions (Signed)
Medication Instructions:  Your physician recommends that you continue on your current medications as directed. Please refer to the Current Medication list given to you today.  We would like the Systolic Blood Pressure to be below 130's   *If you need a refill on your cardiac medications before your next appointment, please call your pharmacy*   Lab Work: Your physician recommends that you return for lab work just before your next visit. (Fasting)   If you have labs (blood work) drawn today and your tests are completely normal, you will receive your results only by: MyChart Message (if you have MyChart) OR A paper copy in the mail If you have any lab test that is abnormal or we need to change your treatment, we will call you to review the results.   Testing/Procedures: NONE    Follow-Up: At [facility], you and your health needs are our priority.  As part of our continuing mission to provide you with exceptional heart care, we have created designated Provider Care Teams.  These Care Teams include your primary Cardiologist (physician) and Advanced Practice Providers (APPs -  Physician Assistants and Nurse Practitioners) who all work together to provide you with the care you need, when you need it.  We recommend signing up for the patient portal called "MyChart".  Sign up information is provided on this After Visit Summary.  MyChart is used to connect with patients for Virtual Visits (Telemedicine).  Patients are able to view lab/test results, encounter notes, upcoming appointments, etc.  Non-urgent messages can be sent to your provider as well.   To learn more about what you can do with MyChart, go to [email or web address].    Your next appointment:   6 month(s)  Provider:   Sharlene Dory, NP    Other Instructions Thank you for choosing [facility]!

## 2022-10-27 NOTE — Progress Notes (Signed)
Office Visit    Patient Name: Kristy Lewis Date of Encounter: 10/27/2022 PCP:  Kirstie Peri, MD [facility]  Cardiologist:  Dina Rich, MD  Advanced Practice Provider:  No care team member to display Electrophysiologist:  None   Chief Complaint and HPI    Kristy Lewis is a 62 y.o. female with a hx of chest pain, DOE, history of sinus venosus ASD repair, who presents today for scheduled follow-up.    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  Previous cardiovascular history includes cardiac catheterization 2019 that revealed normal coronary arteries.  Echocardiogram in 2020 showed EF 55 to 60%, mild LVH.  Underwent coronary CTA in 2021 that showed coronary calcium score 0.  Normal ASD repair without shunting.  PFTs in 2021 showed minimal obstruction.  Last seen by Dr. Dina Rich 1 year ago.  Noted chronic stable symptoms regarding chest pain, Dr. Wyline Mood stated due to no evidence of CAD, continue to monitor.  Overall was doing well.    Last seen in office on 07/01/2022. Noted intermittent chest pain for several months. Not always associated with exertion. Located mid chest, no radiation, sitting helps relieve the pain. PCP believed it was GERD related, started her on Protonix that had helped this pain. Stated EKG at PCP's office looked good. Denied any recurrence in her high BP that occurred several weeks ago. Denied any shortness of breath, palpitations, syncope, presyncope, dizziness, orthopnea, PND, swelling or significant weight changes, acute bleeding, or claudication. Echo updated- see full report below.   Today she presents for follow-up. SBP averaging 130's-140's. Does admit to fatigue. Denies any chest pain, shortness of breath, palpitations, syncope, presyncope, dizziness, orthopnea, PND, swelling or significant weight changes, acute bleeding, or  claudication. Says she could not tolerate higher dose of amlodipine as this caused her to experience significant fatigue. Has returned to take 2.5 mg of Amlodipine. Does admit to recent stress.  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 FH: Mom - HTN, Dad died by suicide at age 59  EKGs/Labs/Other Studies Reviewed:   The following studies were reviewed today:   EKG:  EKG is not ordered today.   Echo 07/2022:  1. Left ventricular ejection fraction, by estimation, is 55 to 60%. The  left ventricle has normal function. The left ventricle has no regional  wall motion abnormalities. There is mild left ventricular hypertrophy.  Left ventricular diastolic parameters  were normal.   2. Right ventricular systolic function is normal. The right ventricular  size is normal.   3. The mitral valve is normal in structure. No evidence of mitral valve regurgitation. No evidence of mitral stenosis.   4. The tricuspid valve is abnormal.   5. The aortic valve has an indeterminant number of cusps. There is mild calcification of the aortic valve. There is mild thickening of the aortic valve. Aortic valve regurgitation is not visualized. No aortic stenosis is present.   6. The inferior vena cava is normal in size with greater than 50%  respiratory variability, suggesting right atrial pressure of 3 mmHg.   Comparison(s): 55-60% Normal RWM. No residual shunt-status post closure of PFO. Mild LVH.  CCTA 06/2019:  IMPRESSION: 1. Coronary calcium score of 0. This was 0 percentile for age and sex matched control.   2. Normal coronary origin with right dominance.   3. CAD-RADS 0. No evidence of CAD (0%).  Consider non-atherosclerotic causes of chest pain.   4. Moderately dilated pulmonary artery measuring 38 mm.   5. When compared to the prior study from 08/26/2017 a large sinus venosus ASD with anomalous RUPV is now repaired and there is no residual shunting. Right atrial and right  ventricular size has decreased, now only mildly enlarged.  Echo 04/2018: 1. The left ventricle has normal systolic function, with an ejection  fraction of 55-60%. The cavity size was normal. There is mild concentric  left ventricular hypertrophy. Left ventricular diastolic parameters were  normal. No evidence of left  ventricular regional wall motion abnormalities.   2. The right ventricle has mildly reduced systolic function. The cavity  was mildly enlarged. There is no increase in right ventricular wall  thickness.   3. S/p minimally invasive repair of sinus venosus ASD, correction of  partial anomalous pulmonary venous return, and closure of a PFO. No  significant residual shunt noted.   4. The mitral valve is grossly normal.   5. The tricuspid valve is grossly normal.   6. The aortic valve is grossly normal.   7. No pericardial effusion.   Review of Systems   All other systems reviewed and are otherwise negative except as noted above.  Physical Exam    VS:  BP 136/86   Pulse (!) 58   Ht 5\' 7"  (1.702 m)   Wt 173 lb (78.5 kg)   SpO2 99%   BMI 27.10 kg/m  , BMI Body mass index is 27.1 kg/m.  Wt Readings from Last 3 Encounters:  10/27/22 173 lb (78.5 kg)  08/11/22 174 lb 6.4 oz (79.1 kg)  08/07/22 171 lb 9.6 oz (77.8 kg)     GEN: Well nourished, well developed, in no acute distress. HEENT: normal. Neck: Supple, no JVD, carotid bruits, or masses. Cardiac: S1/S2, RRR, no murmurs, rubs, or gallops. No clubbing, cyanosis, edema.  Radials/PT 2+ and equal bilaterally.  Respiratory:  Respirations regular and unlabored, clear to auscultation bilaterally. MS: No deformity or atrophy. Skin: Warm and dry, no rash. Neuro:  Strength and sensation are intact. Psych: Normal affect.  Assessment & Plan    Elevated BP reading Elevated BP with home readings averaging 130's - 140's. Discussed SBP goal < 130. Could not tolerate higher dose of Amlodipine. Discussed to monitor BP at  home at least 2 hours after medications and sitting for 5-10 minutes. Heart healthy diet and regular cardiovascular exercise encouraged. She will contact our office and notify us if SBP consistently > 130, would initiate low dose ACE inhibitor/ARB.   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 Echo 07/2022 showed no residual shut after closure of PFO. Heart healthy diet and regular cardiovascular exercise encouraged.   3. HLD LDL 07/2022 was 144. Discussed ASCVD risk score. Pt requests to focus on lifestyle modifications at this time. Heart healthy diet and regular cardiovascular exercise encouraged. Will repeat FLP in 6 months.   4. Fatigue, stress Believe her fatigue is related to recent stress. Denies any red flag signs/symptoms. Support given. Continue to follow with PCP.  Will provide refills per her request.  Disposition: Follow up in 6 months with Dina Rich, MD or APP.  Signed, Sharlene Dory, NP

## 2022-11-06 ENCOUNTER — Other Ambulatory Visit: Payer: Self-pay | Admitting: Internal Medicine

## 2022-11-06 DIAGNOSIS — Z1231 Encounter for screening mammogram for malignant neoplasm of breast: Secondary | ICD-10-CM

## 2022-11-10 ENCOUNTER — Ambulatory Visit
Admission: RE | Admit: 2022-11-10 | Discharge: 2022-11-10 | Disposition: A | Discharge status: Home / Self Care | Payer: 59 | Source: Ambulatory Visit | Attending: Internal Medicine | Admitting: Internal Medicine

## 2022-11-10 DIAGNOSIS — Z1231 Encounter for screening mammogram for malignant neoplasm of breast: Secondary | ICD-10-CM | POA: Diagnosis not present

## 2022-12-31 DIAGNOSIS — Z87891 Personal history of nicotine dependence: Secondary | ICD-10-CM | POA: Diagnosis not present

## 2022-12-31 DIAGNOSIS — F419 Anxiety disorder, unspecified: Secondary | ICD-10-CM | POA: Diagnosis not present

## 2022-12-31 DIAGNOSIS — Z791 Long term (current) use of non-steroidal anti-inflammatories (NSAID): Secondary | ICD-10-CM | POA: Diagnosis not present

## 2022-12-31 DIAGNOSIS — I499 Cardiac arrhythmia, unspecified: Secondary | ICD-10-CM | POA: Diagnosis not present

## 2022-12-31 DIAGNOSIS — Z79899 Other long term (current) drug therapy: Secondary | ICD-10-CM | POA: Diagnosis not present

## 2022-12-31 DIAGNOSIS — J449 Chronic obstructive pulmonary disease, unspecified: Secondary | ICD-10-CM | POA: Diagnosis not present

## 2022-12-31 DIAGNOSIS — E785 Hyperlipidemia, unspecified: Secondary | ICD-10-CM | POA: Diagnosis not present

## 2022-12-31 DIAGNOSIS — F319 Bipolar disorder, unspecified: Secondary | ICD-10-CM | POA: Diagnosis not present

## 2022-12-31 DIAGNOSIS — Z809 Family history of malignant neoplasm, unspecified: Secondary | ICD-10-CM | POA: Diagnosis not present

## 2022-12-31 DIAGNOSIS — K219 Gastro-esophageal reflux disease without esophagitis: Secondary | ICD-10-CM | POA: Diagnosis not present

## 2022-12-31 DIAGNOSIS — M1990 Unspecified osteoarthritis, unspecified site: Secondary | ICD-10-CM | POA: Diagnosis not present

## 2022-12-31 DIAGNOSIS — I10 Essential (primary) hypertension: Secondary | ICD-10-CM | POA: Diagnosis not present

## 2023-01-09 DIAGNOSIS — S338XXA Sprain of other parts of lumbar spine and pelvis, initial encounter: Secondary | ICD-10-CM | POA: Diagnosis not present

## 2023-01-09 DIAGNOSIS — S134XXA Sprain of ligaments of cervical spine, initial encounter: Secondary | ICD-10-CM | POA: Diagnosis not present

## 2023-01-09 DIAGNOSIS — S233XXA Sprain of ligaments of thoracic spine, initial encounter: Secondary | ICD-10-CM | POA: Diagnosis not present

## 2023-03-19 DIAGNOSIS — D1801 Hemangioma of skin and subcutaneous tissue: Secondary | ICD-10-CM | POA: Diagnosis not present

## 2023-03-19 DIAGNOSIS — L309 Dermatitis, unspecified: Secondary | ICD-10-CM | POA: Diagnosis not present

## 2023-04-27 ENCOUNTER — Ambulatory Visit: Payer: 59 | Attending: Nurse Practitioner | Admitting: Nurse Practitioner

## 2023-04-27 ENCOUNTER — Encounter: Payer: Self-pay | Admitting: Nurse Practitioner

## 2023-04-27 VITALS — BP 112/70 | HR 82 | Ht 67.0 in | Wt 175.0 lb

## 2023-04-27 DIAGNOSIS — Z8774 Personal history of (corrected) congenital malformations of heart and circulatory system: Secondary | ICD-10-CM

## 2023-04-27 DIAGNOSIS — E785 Hyperlipidemia, unspecified: Secondary | ICD-10-CM

## 2023-04-27 DIAGNOSIS — R0789 Other chest pain: Secondary | ICD-10-CM

## 2023-04-27 DIAGNOSIS — Q249 Congenital malformation of heart, unspecified: Secondary | ICD-10-CM | POA: Diagnosis not present

## 2023-04-27 NOTE — Patient Instructions (Signed)

## 2023-04-27 NOTE — Progress Notes (Unsigned)
 Office Visit    Patient Name: Kristy Lewis Date of Encounter: 04/27/2023 PCP:  Kirstie Peri, MD [facility]  Cardiologist:  Dina Rich, MD  Advanced Practice Provider:  No care team member to display Electrophysiologist:  None   Chief Complaint and HPI    Kristy Lewis is a 63 y.o. female with a hx of chest pain, DOE, history of sinus venosus ASD repair, who presents today for scheduled follow-up.    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  Previous cardiovascular history includes cardiac catheterization 2019 that revealed normal coronary arteries.  Echocardiogram in 2020 showed EF 55 to 60%, mild LVH.  Underwent coronary CTA in 2021 that showed coronary calcium score 0.  Normal ASD repair without shunting.  PFTs in 2021 showed minimal obstruction.   Last seen by Dr. Dina Rich 1 year ago.  Noted chronic stable symptoms regarding chest pain, Dr. Wyline Mood stated due to no evidence of CAD, continue to monitor.  Overall was doing well.    10/27/2022 - Today she presents for follow-up. SBP averaging 130's-140's. Does admit to fatigue. Denies any chest pain, shortness of breath, palpitations, syncope, presyncope, dizziness, orthopnea, PND, swelling or significant weight changes, acute bleeding, or claudication. Says she could not tolerate higher dose of amlodipine as this caused her to experience significant fatigue. Has returned to take 2.5 mg of Amlodipine. Does admit to recent stress.  04/27/2023 - Presents for scheduled follow-up. Doing well. Does admit to some chest tightness associated with certain movements and associated with her job, attributes this to overworking her body. She says hot baths, stretching, heat/ice, and says Ibuprofen helps. Denies any shortness of breath, palpitations, syncope, presyncope, dizziness, orthopnea, PND, swelling or significant  weight changes, acute bleeding, or claudication.   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 FH: Mom - HTN, Dad died by suicide at age 58.   EKGs/Labs/Other Studies Reviewed:   The following studies were reviewed today:   EKG:  EKG is not ordered today.   Echo 07/2022:  1. Left ventricular ejection fraction, by estimation, is 55 to 60%. The  left ventricle has normal function. The left ventricle has no regional  wall motion abnormalities. There is mild left ventricular hypertrophy.  Left ventricular diastolic parameters  were normal.   2. Right ventricular systolic function is normal. The right ventricular  size is normal.   3. The mitral valve is normal in structure. No evidence of mitral valve regurgitation. No evidence of mitral stenosis.   4. The tricuspid valve is abnormal.   5. The aortic valve has an indeterminant number of cusps. There is mild calcification of the aortic valve. There is mild thickening of the aortic valve. Aortic valve regurgitation is not visualized. No aortic stenosis is present.   6. The inferior vena cava is normal in size with greater than 50%  respiratory variability, suggesting right atrial pressure of 3 mmHg.   Comparison(s): 55-60% Normal RWM. No residual shunt-status post closure of PFO. Mild LVH.  CCTA 06/2019:  IMPRESSION: 1. Coronary calcium score of 0. This was 0 percentile for age and sex matched control.   2. Normal coronary origin with right dominance.   3. CAD-RADS 0. No evidence of CAD (0%). Consider non-atherosclerotic causes of chest pain.   4. Moderately dilated pulmonary artery measuring 38 mm.   5. When compared to the prior study  from 08/26/2017 a large sinus venosus ASD with anomalous RUPV is now repaired and there is no residual shunting. Right atrial and right ventricular size has decreased, now only mildly enlarged.  Echo 04/2018: 1. The left ventricle has normal systolic function, with an ejection   fraction of 55-60%. The cavity size was normal. There is mild concentric  left ventricular hypertrophy. Left ventricular diastolic parameters were  normal. No evidence of left  ventricular regional wall motion abnormalities.   2. The right ventricle has mildly reduced systolic function. The cavity  was mildly enlarged. There is no increase in right ventricular wall  thickness.   3. S/p minimally invasive repair of sinus venosus ASD, correction of  partial anomalous pulmonary venous return, and closure of a PFO. No  significant residual shunt noted.   4. The mitral valve is grossly normal.   5. The tricuspid valve is grossly normal.   6. The aortic valve is grossly normal.   7. No pericardial effusion.   Review of Systems   All other systems reviewed and are otherwise negative except as noted above.  Physical Exam    VS:  BP 112/70   Pulse 82   Ht 5\' 7"  (1.702 m)   Wt 175 lb (79.4 kg)   SpO2 97%   BMI 27.41 kg/m  , BMI Body mass index is 27.41 kg/m.  Wt Readings from Last 3 Encounters:  04/27/23 175 lb (79.4 kg)  10/27/22 173 lb (78.5 kg)  08/11/22 174 lb 6.4 oz (79.1 kg)     GEN: Well nourished, well developed, in no acute distress. HEENT: normal. Neck: Supple, no JVD, carotid bruits, or masses. Cardiac: S1/S2, RRR, no murmurs, rubs, or gallops. No clubbing, cyanosis, edema.  Radials/PT 2+ and equal bilaterally.  Respiratory:  Respirations regular and unlabored, clear to auscultation bilaterally. MS: No deformity or atrophy. Skin: Warm and dry, no rash. Neuro:  Strength and sensation are intact. Psych: Normal affect.  Assessment & Plan    Atypical chest pain Etiology felt to be MSK related to her job. No indication for ischemic evaluation at this time. No medication changes at time time. Care and ED precautions discussed.   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 Echo 07/2022 showed no residual shut after closure of PFO. Heart healthy diet and regular cardiovascular exercise encouraged.   3. HLD LDL 07/2022 was 144. Discussed ASCVD risk score. Pt has prevoiusly requested to focus on lifestyle modifications at this time. Heart healthy diet and regular cardiovascular exercise encouraged. Previously ordered FLP in past - has not been completed. She defers current labwork to her PCP. Continue to follow with PCP. Will provide refills per her request.  Disposition: Follow up in 6 months with Dina Rich, MD or APP.  Signed, Sharlene Dory, NP

## 2023-09-29 DIAGNOSIS — Z0000 Encounter for general adult medical examination without abnormal findings: Secondary | ICD-10-CM | POA: Diagnosis not present

## 2023-09-29 DIAGNOSIS — R5383 Other fatigue: Secondary | ICD-10-CM | POA: Diagnosis not present

## 2023-09-29 DIAGNOSIS — E7800 Pure hypercholesterolemia, unspecified: Secondary | ICD-10-CM | POA: Diagnosis not present

## 2023-09-29 DIAGNOSIS — Z79899 Other long term (current) drug therapy: Secondary | ICD-10-CM | POA: Diagnosis not present

## 2023-09-29 DIAGNOSIS — M1990 Unspecified osteoarthritis, unspecified site: Secondary | ICD-10-CM | POA: Diagnosis not present

## 2023-10-01 ENCOUNTER — Encounter (INDEPENDENT_AMBULATORY_CARE_PROVIDER_SITE_OTHER): Payer: Self-pay | Admitting: *Deleted

## 2023-11-16 DIAGNOSIS — Z6828 Body mass index (BMI) 28.0-28.9, adult: Secondary | ICD-10-CM | POA: Diagnosis not present

## 2023-11-16 DIAGNOSIS — M1991 Primary osteoarthritis, unspecified site: Secondary | ICD-10-CM | POA: Diagnosis not present

## 2023-11-16 DIAGNOSIS — M25552 Pain in left hip: Secondary | ICD-10-CM | POA: Diagnosis not present

## 2023-11-16 DIAGNOSIS — M65311 Trigger thumb, right thumb: Secondary | ICD-10-CM | POA: Diagnosis not present

## 2023-11-16 DIAGNOSIS — M25551 Pain in right hip: Secondary | ICD-10-CM | POA: Diagnosis not present

## 2023-11-16 DIAGNOSIS — R7689 Other specified abnormal immunological findings in serum: Secondary | ICD-10-CM | POA: Diagnosis not present

## 2023-11-16 DIAGNOSIS — E663 Overweight: Secondary | ICD-10-CM | POA: Diagnosis not present

## 2024-01-15 ENCOUNTER — Ambulatory Visit (HOSPITAL_COMMUNITY)
Admission: RE | Admit: 2024-01-15 | Discharge: 2024-01-15 | Disposition: A | Discharge status: Home / Self Care | Source: Ambulatory Visit | Attending: Internal Medicine | Admitting: Internal Medicine

## 2024-01-15 ENCOUNTER — Other Ambulatory Visit (HOSPITAL_COMMUNITY): Payer: Self-pay | Admitting: Internal Medicine

## 2024-01-15 DIAGNOSIS — R52 Pain, unspecified: Secondary | ICD-10-CM
